# Patient Record
Sex: Female | Born: 1960 | Race: White | Hispanic: No | Marital: Married | Smoking: Current every day smoker
Health system: Southern US, Community
[De-identification: ages and names within clinical notes are randomized; demographics above are authoritative.]

## PROBLEM LIST (undated history)

## (undated) DIAGNOSIS — E785 Hyperlipidemia, unspecified: Secondary | ICD-10-CM

## (undated) DIAGNOSIS — D649 Anemia, unspecified: Secondary | ICD-10-CM

## (undated) DIAGNOSIS — N189 Chronic kidney disease, unspecified: Secondary | ICD-10-CM

## (undated) DIAGNOSIS — Z5189 Encounter for other specified aftercare: Secondary | ICD-10-CM

## (undated) DIAGNOSIS — I10 Essential (primary) hypertension: Secondary | ICD-10-CM

## (undated) DIAGNOSIS — E119 Type 2 diabetes mellitus without complications: Secondary | ICD-10-CM

## (undated) DIAGNOSIS — IMO0001 Reserved for inherently not codable concepts without codable children: Secondary | ICD-10-CM

## (undated) DIAGNOSIS — R51 Headache: Secondary | ICD-10-CM

## (undated) DIAGNOSIS — J449 Chronic obstructive pulmonary disease, unspecified: Secondary | ICD-10-CM

## (undated) HISTORY — DX: Hyperlipidemia, unspecified: E78.5

## (undated) HISTORY — PX: CYSTOSCOPY W/ URETERAL STENT PLACEMENT: SHX1429

## (undated) HISTORY — PX: DILATION AND CURETTAGE OF UTERUS: SHX78

---

## 2004-03-01 ENCOUNTER — Inpatient Hospital Stay (HOSPITAL_COMMUNITY): Admission: EM | Admit: 2004-03-01 | Discharge: 2004-03-02 | Payer: Self-pay | Admitting: *Deleted

## 2004-03-03 ENCOUNTER — Encounter: Admission: RE | Admit: 2004-03-03 | Discharge: 2004-03-03 | Payer: Self-pay | Admitting: Obstetrics and Gynecology

## 2004-03-07 ENCOUNTER — Encounter: Admission: RE | Admit: 2004-03-07 | Discharge: 2004-03-07 | Payer: Self-pay | Admitting: Family Medicine

## 2004-03-24 ENCOUNTER — Encounter: Admission: RE | Admit: 2004-03-24 | Discharge: 2004-03-24 | Payer: Self-pay | Admitting: Family Medicine

## 2004-04-25 ENCOUNTER — Ambulatory Visit (HOSPITAL_COMMUNITY): Admission: RE | Admit: 2004-04-25 | Discharge: 2004-04-25 | Payer: Self-pay | Admitting: Family Medicine

## 2004-04-25 ENCOUNTER — Encounter (INDEPENDENT_AMBULATORY_CARE_PROVIDER_SITE_OTHER): Payer: Self-pay | Admitting: Specialist

## 2004-04-28 ENCOUNTER — Encounter: Admission: RE | Admit: 2004-04-28 | Discharge: 2004-04-28 | Payer: Self-pay | Admitting: Obstetrics and Gynecology

## 2004-07-01 ENCOUNTER — Ambulatory Visit: Payer: Self-pay | Admitting: Family Medicine

## 2005-06-13 ENCOUNTER — Ambulatory Visit: Payer: Self-pay | Admitting: General Practice

## 2006-06-27 ENCOUNTER — Ambulatory Visit: Payer: Self-pay | Admitting: General Practice

## 2007-07-02 ENCOUNTER — Ambulatory Visit: Payer: Self-pay | Admitting: General Practice

## 2008-07-08 ENCOUNTER — Ambulatory Visit: Payer: Self-pay | Admitting: General Practice

## 2008-12-22 ENCOUNTER — Ambulatory Visit (HOSPITAL_COMMUNITY): Admission: RE | Admit: 2008-12-22 | Discharge: 2008-12-22 | Payer: Self-pay | Admitting: Obstetrics

## 2010-07-06 ENCOUNTER — Ambulatory Visit: Payer: Self-pay | Admitting: General Practice

## 2010-10-02 ENCOUNTER — Encounter: Payer: Self-pay | Admitting: Family Medicine

## 2011-01-27 NOTE — Discharge Summary (Signed)
NAME:  Robin Chen, Robin Chen                             ACCOUNT NO.:  [id number]   MEDICAL RECORD NO.:  [id number]                   PATIENT TYPE:  INP   LOCATION:  5725                                 FACILITY:  [facility]   PHYSICIAN:  Leighton Roach McDiarmid, M.D.             DATE OF BIRTH:  [birth year]   DATE OF ADMISSION:  03/01/2004  DATE OF DISCHARGE:  03/02/2004                                 DISCHARGE SUMMARY   DISCHARGE DIAGNOSES:  1. Anemia, microcytic, asymptomatic.  2. Menometrorrhagia.   CONSULTATIONS:  None.   PROCEDURES:  1. Transfusion of 2 units of packed red blood cells.  2. Abdominal pelvic ultrasound.  Impression normal uterine size of 1.3 cm     endometrium with small endometrial polyp.  No evidence of fibroids.     Small simple cyst.   DISCHARGE MEDICATIONS:  Iron sulfate 325 mg one p.o. t.i.d.   ACTIVITY:  As tolerated.  She is to return to work Tuesday, March 08, 2004.   DIET:  As tolerated.   FOLLOWUP:  Follow up Thursday, March 03, 2004, at 3 p.m. at Gynecology  Clinic, [facility] on [facility].   BRIEF HISTORY AND HOSPITAL COURSE:  Robin Chen is a 50 year old female from  [place] who had been having dizziness, headaches and progressive weakness  over the past four days with known metrorrhagia x 1.5 years with 6-7 days of  bleeding monthly, heavy clots, and severe cramping.  History of difficulty  with associated anemia in the past with hemoglobin of 5.7 in May 2004.  She  was seen at [facility] and primary care physician for these  symptoms and found to have a hemoglobin of 6.8.   PROBLEM #1 - ANEMIA WITH SYMPTOMATIC MICROCYTIC ANEMIA, LIKELY CHRONIC  SECONDARY TO MENOMETRORRHAGIA AS WELL AS IRON DEFICIENCY ANEMIA:  She was  admitted and given fluids as well as 2 units of packed red blood cells.  Hemoglobin increased to 8.8.  No other source of bleeding, i.e, GI source  found and fecal occult negative.  Outpatient GYN consult as stated  above.  Iron studies with ferritin of 1 and serum iron of 11% saturation.  Due to  severe iron deficiency anemia, she was started on iron as above.   PROBLEM #2 - MENOMETRORRHAGIA:  Unclear cause but likely the reason for #1.  Pelvic ultrasound negative with negative exam.  Follow up GYN as above.   DISPOSITION:  The patient is much improved with blood products, stable and  discharged home on hospital day #2 and follow up as above.   LABORATORY DATA:  Labs March 01, 2004, sodium 139, potassium 4.0, chloride  106, CO2 28, glucose 95, BUN 7, creatinine 0.6, calcium 9.3.  LFTs within  normal limits.  Hemoglobin electrophoresis performed with normal evaluation  of hemoglobin __________ and iron studies as above.  On March 02, 2004,  hemoglobin 8.8,  hematocrit 28.4, MCV 60.8.      Ace Gins, MD                        Etta Grandchild, M.D.    JS/MEDQ  D:  04/05/2004  T:  04/05/2004  Job:  [id number]   cc:   [facility]

## 2011-01-27 NOTE — Op Note (Signed)
NAME:  Robin Chen, Robin Chen                             ACCOUNT NO.:  [id number]   MEDICAL RECORD NO.:  [id number]                   PATIENT TYPE:  AMB   LOCATION:  SDC                                  [facility]   PHYSICIAN:  Tanya S. Shawnie Pons, M.D.                DATE OF BIRTH:  February 15, 1961   DATE OF PROCEDURE:  04/25/2004  DATE OF DISCHARGE:                                 OPERATIVE REPORT   PREOPERATIVE DIAGNOSIS:  Abnormal uterine bleeding and anemia.   POSTOPERATIVE DIAGNOSIS:  Abnormal uterine bleeding and anemia.   OPERATION PERFORMED:  Dilation and curettage, hysteroscopy.   SURGEON:  Shelbie Proctor. Shawnie Pons, M.D.   ANESTHESIA:  MAC.   ANESTHESIOLOGIST:  Raul Del, M.D.   FINDINGS:  Multiple polyps noted.   ESTIMATED BLOOD LOSS:  Less than 25 mL.   SPECIMENS:  Currettings to pathology.   INDICATIONS FOR PROCEDURE:  The patient is a 50 year old gravida 3, para 3,  who has heavy vaginal bleeding __________ recently due to symptomatic anemia  with hemoglobin of 6.8 and transfused two units of packed red blood cells.  She had an endometrial biopsy and Pap smear that were normal and had pelvic  ultrasound which revealed a probable uterine endometrial polyp.  She is here  today for definitive treatment.   DESCRIPTION OF PROCEDURE:  The patient was taken to the operating room.  She  was placed in dorsal lithotomy position in [place] stirrups.  She was prepped  and draped in the usual sterile fashion.  Her bladder drained with a red  rubber catheter.  Speculum then placed inside the vagina and cervix  visualized and grasped with single toothed tenaculum and sounded to 9 cm.  It was sequentially dilated until the hysteroscope could be fitted easily.  The saline was then turned on and the uterine cavity infused with a total of  20 mL of straight saline. The uterine cavity revealed multiple polyps,  especially anteriorly.  Otherwise, it was without lesion.  Some fluffy  endometrium was  noted.  The hysteroscope was then removed and sharp  curettage was done until a gritty surface was found in all four quadrants.  The curettings were then sent to pathology.  The single toothed tenaculum  and speculum were removed and a bimanual exam done to make sure the uterus  was firm.  All lap, needle and instruments were all correct times two.  She  was then taken to recovery room awake and in stable condition.                                               Shelbie Proctor. Shawnie Pons, M.D.    TSP/MEDQ  D:  04/25/2004  T:  04/25/2004  Job:  [id number]

## 2011-01-27 NOTE — Group Therapy Note (Signed)
NAME:  Robin Chen, Ravert                 ACCOUNT NO.:  [id number]   MEDICAL RECORD NO.:  [id number]          PATIENT TYPE:  WOC   LOCATION:  [facility]                   FACILITY:  [facility]   PHYSICIAN:  Tinnie Gens, MD        DATE OF BIRTH:  22-Jul-1961   DATE OF SERVICE:  07/01/2004                                    CLINIC NOTE   CHIEF COMPLAINT:  Follow-up.   HISTORY OF PRESENT ILLNESS:  Patient is a 50 year old gravida 3, para 3 who  is status post D&C and hysteroscopy for endometrial polyp in August.  The  patient reports that since that time she has periods that continue to be  regular but now her bleeding has gone from 7-10 days down to four days.  It  was only heavy on the first day.  She continues to have some cramping that  is associated with her periods but states that is something she can live  with at this point.   PHYSICAL EXAMINATION:  VITAL SIGNS:  As noted in the chart.  GENERAL:  She is a well-developed, well-nourished white female in no acute  distress.   IMPRESSION:  1.  Intrauterine bleeding related to polyp status post removal with improved      bleeding.  2.  Pelvic pain and dysmenorrhea.   PLAN:  1.  Naprosyn 500 mg one p.o. b.i.d. p.r.n. with food for associated      cramping.  2.  Follow up in June of 2006 for her annual Pap smear and to see how her      bleeding is doing.      TP/MEDQ  D:  07/01/2004  T:  07/01/2004  Job:  [id number]

## 2011-01-27 NOTE — Group Therapy Note (Signed)
NAME:  Robin Chen, Robin Chen                             ACCOUNT NO.:  [id number]   MEDICAL RECORD NO.:  [id number]                   PATIENT TYPE:  OUT   LOCATION:  [facility]                           FACILITY:  [facility]   PHYSICIAN:  Tinnie Gens, MD                     DATE OF BIRTH:  August 05, 1961   DATE OF SERVICE:  03/03/2004                                    CLINIC NOTE   CHIEF COMPLAINT:  Anemia and abnormal bleeding.   HISTORY OF PRESENT ILLNESS:  Ms. Doorn is a 50 year old [place] woman who was  admitted to [facility] on March 01, 2004 for symptomatic severe  anemia with a hemoglobin of 6.8 attributed to her history of  menometrorrhagia.  She describes her periods as being very heavy and long in  duration, lasting 7-10 days.  She denies any bleeding between periods but  will have occasionally two times a month and then will skip months.  She has  had blood transfusions in the past for this and was transfused 2 units on  this last admission.  She has been on iron before but has not taken on a  regular basis, though admits that she probably needs to.   Evaluation in the hospital included iron studies which were consistent with  a profound iron deficiency anemia.  Pelvic ultrasound revealed normal  uterine size with 6.2 x 4.6 x 6 cm.  The endometrium was 1.3 cm maximum  thickness and there was thought to be a small polyp in the endometrial  cavity.  Ovaries were noted to be normal with the exception of a simple cyst  on the right of 2.6 x 1.9 cm.  No fibroids were noted.   Her last menstrual period was on February 20, 2004 and she has not had any  uterine bleeding for the past week.  With regards to the anemia, she does  not feel back to normal but the headaches and dizziness are much improved.  She denies having an abnormal Pap smear and states her last Pap smear was on  March 01, 2004 but it is difficult to attain if this was simply a pelvic exam  or, indeed, a cancer screening Pap  smear.   PAST MEDICAL HISTORY:  She is a gravida 3 para 3 - all vaginal deliveries.  Does have a self diagnosis of heartburn treated with Tums, but otherwise is  well.   ALLERGIES:  PENICILLIN.   MEDICINES:  Iron pills.   SOCIAL HISTORY:  Significant for that she is a one-pack-per-day smoker for  the last 25 years.   FAMILY HISTORY:  Significant for MIs in her father and throat cancer in her  mother.  No history of gynecological colon cancer.   PHYSICAL EXAMINATION:  GENERAL:  She is a pale but otherwise well-appearing  white, mildly-obese female in no acute distress.  VITAL SIGNS:  Normal with  a blood pressure of 121/64, pulse of 85, and  temperature 99.1.  ABDOMEN:  Soft, nontender, nondistended.  GENITOURINARY:  Normal external genitalia, no lesions.  Bimanual exam  reveals a minimally-tender uterus without cervical motion tenderness.  No  fullness or masses are appreciated.  Scant amount of white discharge.  Cervix appeared within normal limits.  A Pap smear was obtained.   Endometrial biopsy was performed using tenaculum to stabilize the cervix  following clearing of the cervix with iodine.  Endometrial biopsy catheter  was introduced without resistance and sounded to approximately 8 cm.  Endometrial tissue was retrieved without any difficulty and is sent to  pathology.  The patient tolerated the procedure well and without incident.  There was hemostasis following removal of the tenaculum.   ASSESSMENT AND PLAN:  1. Abnormal uterine bleeding.  Will follow up on Pap smear and endometrial     biopsy.  The patient to follow up for discussion of these in 2-3 weeks.     It does appear to be unlikely that the majority of nonspecific bleeding     secondary to the perceived endometrial polyp on ultrasound.  2. Iron deficiency anemia.  I encouraged the patient to continue taking iron     and could use over-the-counter medicines for constipation if needed.   The patient was seen  and discussed with Dr. Tinnie Gens.     Douglass Rivers, MD                        Tinnie Gens, MD    CH/MEDQ  D:  03/03/2004  T:  03/04/2004  Job:  [id number]

## 2011-07-05 ENCOUNTER — Ambulatory Visit: Payer: Self-pay

## 2012-01-07 ENCOUNTER — Ambulatory Visit (HOSPITAL_COMMUNITY)
Admission: RE | Admit: 2012-01-07 | Discharge: 2012-01-07 | Disposition: A | Discharge status: Home / Self Care | Payer: BC Managed Care – PPO | Source: Ambulatory Visit | Attending: Family Medicine | Admitting: Family Medicine

## 2012-01-07 ENCOUNTER — Ambulatory Visit (INDEPENDENT_AMBULATORY_CARE_PROVIDER_SITE_OTHER): Payer: BC Managed Care – PPO | Admitting: Family Medicine

## 2012-01-07 VITALS — BP 110/62 | HR 96 | Temp 98.4°F | Resp 18 | Ht 65.0 in | Wt 175.0 lb

## 2012-01-07 DIAGNOSIS — N390 Urinary tract infection, site not specified: Secondary | ICD-10-CM

## 2012-01-07 DIAGNOSIS — R1011 Right upper quadrant pain: Secondary | ICD-10-CM

## 2012-01-07 DIAGNOSIS — R197 Diarrhea, unspecified: Secondary | ICD-10-CM

## 2012-01-07 DIAGNOSIS — N200 Calculus of kidney: Secondary | ICD-10-CM

## 2012-01-07 DIAGNOSIS — N201 Calculus of ureter: Secondary | ICD-10-CM | POA: Insufficient documentation

## 2012-01-07 DIAGNOSIS — N2889 Other specified disorders of kidney and ureter: Secondary | ICD-10-CM | POA: Insufficient documentation

## 2012-01-07 DIAGNOSIS — D72829 Elevated white blood cell count, unspecified: Secondary | ICD-10-CM | POA: Insufficient documentation

## 2012-01-07 DIAGNOSIS — E860 Dehydration: Secondary | ICD-10-CM

## 2012-01-07 DIAGNOSIS — R8281 Pyuria: Secondary | ICD-10-CM

## 2012-01-07 DIAGNOSIS — R112 Nausea with vomiting, unspecified: Secondary | ICD-10-CM

## 2012-01-07 DIAGNOSIS — R1031 Right lower quadrant pain: Secondary | ICD-10-CM

## 2012-01-07 DIAGNOSIS — R1110 Vomiting, unspecified: Secondary | ICD-10-CM | POA: Insufficient documentation

## 2012-01-07 DIAGNOSIS — I700 Atherosclerosis of aorta: Secondary | ICD-10-CM | POA: Insufficient documentation

## 2012-01-07 LAB — POCT UA - MICROSCOPIC ONLY: Crystals, Ur, HPF, POC: NEGATIVE

## 2012-01-07 LAB — POCT CBC
HCT, POC: 43.2 % (ref 37.7–47.9)
Hemoglobin: 13.6 g/dL (ref 12.2–16.2)
MID (cbc): 0.4 (ref 0–0.9)
POC Granulocyte: 13.3 — AB (ref 2–6.9)
RBC: 4.88 M/uL (ref 4.04–5.48)
RDW, POC: 13.9 %

## 2012-01-07 LAB — COMPREHENSIVE METABOLIC PANEL
ALT: 52 U/L — ABNORMAL HIGH (ref 0–35)
AST: 38 U/L — ABNORMAL HIGH (ref 0–37)
Albumin: 4 g/dL (ref 3.5–5.2)
Chloride: 105 mEq/L (ref 96–112)
Glucose, Bld: 133 mg/dL — ABNORMAL HIGH (ref 70–99)
Potassium: 4.5 mEq/L (ref 3.5–5.3)
Sodium: 139 mEq/L (ref 135–145)
Total Bilirubin: 1.4 mg/dL — ABNORMAL HIGH (ref 0.3–1.2)
Total Protein: 6.1 g/dL (ref 6.0–8.3)

## 2012-01-07 LAB — POCT URINALYSIS DIPSTICK
Glucose, UA: NEGATIVE
Nitrite, UA: POSITIVE
Urobilinogen, UA: 4

## 2012-01-07 MED ORDER — ONDANSETRON 4 MG PO TBDP: 4.0000 mg | ORAL_TABLET | Freq: Three times a day (TID) | ORAL | Status: DC | PRN

## 2012-01-07 MED ORDER — ONDANSETRON 4 MG PO TBDP
4.0000 mg | ORAL_TABLET | Freq: Once | ORAL | Status: AC
  Administered 2012-01-07: 4 mg via ORAL

## 2012-01-07 MED ORDER — ONDANSETRON 4 MG PO TBDP: 4.0000 mg | ORAL_TABLET | Freq: Three times a day (TID) | ORAL | Status: AC | PRN

## 2012-01-07 MED ORDER — LEVOFLOXACIN 500 MG PO TABS: 500.0000 mg | ORAL_TABLET | Freq: Every day | ORAL | Status: AC

## 2012-01-07 MED ORDER — IOHEXOL 300 MG/ML  SOLN
100.0000 mL | Freq: Once | INTRAMUSCULAR | Status: AC | PRN
  Administered 2012-01-07: 100 mL via INTRAVENOUS

## 2012-01-07 NOTE — Progress Notes (Signed)
Subjective:    Patient ID: Robin Chen, female    DOB: 03/27/1961, 51 y.o.   MRN: [id number]  HPI Robin Chen is a 51 y.o. female C/o RLQ abd pain  - started 2 days ago,  Vomiting started yesterday afternoon - persisted overnight.  Trying to drink sips of water, last emesis 5:30 this am.  Diarrhea started last night - 3 episodes.  Unknown how many vomiting episodes, can't count.  No uop yet this am. No known sick contacts.  Recently traveled form [place] - flew from [place] (visiting there, lives in [place]) April 18th.  Similar episodes of RLQ in past few months - usually improves in a few days.     TX: alleve, ibuprofen, midol.  Language barrier -here with daughter - translating.  Review of Systems  Constitutional: Positive for fever and chills.       Feels feverish, but no known objective fever.  Respiratory: Negative for cough and shortness of breath.   Cardiovascular: Negative for chest pain.  Gastrointestinal: Positive for nausea, vomiting, abdominal pain and diarrhea.  Genitourinary: Negative for dysuria, urgency, frequency, flank pain and difficulty urinating.  Skin: Negative for rash.       Objective:   Physical Exam  Constitutional: She is oriented to person, place, and time. She appears well-developed and well-nourished.       Appears uncomfortable, nontoxic.  HENT:  Head: Normocephalic and atraumatic.  Cardiovascular: Regular rhythm and normal pulses.  Tachycardia present.   Pulmonary/Chest: Effort normal and breath sounds normal.  Abdominal: Soft. Normal appearance. She exhibits no distension. There is tenderness in the right upper quadrant and right lower quadrant. There is tenderness at McBurney's point. There is no rebound.    Neurological: She is alert and oriented to person, place, and time.  Skin: She is not diaphoretic.     Results for orders placed in visit on 01/07/12  POCT CBC      Component Value Range   WBC 14.1 (*) 4.6 - 10.2 (K/uL)   Lymph, poc 0.5  (*) 0.6 - 3.4    POC LYMPH PERCENT 3.3 (*) 10 - 50 (%L)   MID (cbc) 0.4  0 - 0.9    POC MID % 2.5  0 - 12 (%M)   POC Granulocyte 13.3 (*) 2 - 6.9    Granulocyte percent 94.2 (*) 37 - 80 (%G)   RBC 4.88  4.04 - 5.48 (M/uL)   Hemoglobin 13.6  12.2 - 16.2 (g/dL)   HCT, POC 40.9  81.1 - 47.9 (%)   MCV 88.6  80 - 97 (fL)   MCH, POC 27.9  27 - 31.2 (pg)   MCHC 31.5 (*) 31.8 - 35.4 (g/dL)   RDW, POC 91.4     Platelet Count, POC 133 (*) 142 - 424 (K/uL)   MPV 9.7  0 - 99.8 (fL)  POCT URINALYSIS DIPSTICK      Component Value Range   Color, UA brown-orange     Clarity, UA cloudy     Glucose, UA neg     Bilirubin, UA moderate     Ketones, UA 15     Spec Grav, UA 1.020     Blood, UA large     pH, UA 5.5     Protein, UA >=300     Urobilinogen, UA 4.0     Nitrite, UA pos     Leukocytes, UA small (1+)    POCT UA - MICROSCOPIC ONLY      Component  Value Range   WBC, Ur, HPF, POC 14-22     RBC, urine, microscopic 36-50     Bacteria, U Microscopic trace     Mucus, UA small     Epithelial cells, urine per micros 7-10     Crystals, Ur, HPF, POC neg     Casts, Ur, LPF, POC neg     Yeast, UA neg           Assessment & Plan:  Robin Chen is a 51 y.o. female  1. Vomiting  ondansetron (ZOFRAN-ODT) disintegrating tablet 4 mg, Lipase  2. RLQ abdominal pain  POCT CBC, POCT urinalysis dipstick, POCT UA - Microscopic Only  3. Diarrhea    4. Dehydration    5. RUQ abdominal pain  Comprehensive metabolic panel    RLQ pain, with emesis, few episodes diarrhea. Leukocytosis.  DDX elevation with vomiting vs. Bacterial illness, including appendicitis and UTI without urinary symptoms.  Volume depleted - concentrated urine, with hematuria and pyuria. Check CT abd/pelvis today.  Check urine culture, CMP, lipase. Improved nausea with Zofran - RX # 20.  Further eval/tx based on CT.  1755 addendum:  CT report summary: "Partially obstructing 1 cm stone within the right UVJ results in mild right sided  pelvocaliectasis and perinephric stranding. Minimal right-sided downstream ureterectasis and mild right sided uroepithelial enhancement however no distal ureteral stones are identified. Nonspecific though worrisome for infection. Correlation with urinalysis is recommended. Findings suggestive of hepatic steatosis.   Called radiology - discussed results above and plan below with patient's son to translate to patient.  Understanding expressed.  Start Levaquin 500mg  QD. Zofran 4mg  ODT Q8hprn, push fluids, refer to Urology for nephrolith.  CMP pending to evaluate renal function.  Recheck here in am with Dr. Cleta Alberts. RTC/ER precautions given.

## 2012-01-08 ENCOUNTER — Ambulatory Visit (INDEPENDENT_AMBULATORY_CARE_PROVIDER_SITE_OTHER): Payer: BC Managed Care – PPO | Admitting: Emergency Medicine

## 2012-01-08 ENCOUNTER — Encounter (HOSPITAL_COMMUNITY): Payer: Self-pay | Admitting: Anesthesiology

## 2012-01-08 ENCOUNTER — Encounter (HOSPITAL_COMMUNITY): Payer: Self-pay | Admitting: *Deleted

## 2012-01-08 ENCOUNTER — Other Ambulatory Visit (HOSPITAL_COMMUNITY): Payer: Self-pay | Admitting: Pharmacy Technician

## 2012-01-08 ENCOUNTER — Encounter (HOSPITAL_COMMUNITY)
Admission: RE | Disposition: A | Discharge status: Home / Self Care | Payer: Self-pay | Source: Ambulatory Visit | Attending: Urology

## 2012-01-08 ENCOUNTER — Inpatient Hospital Stay (HOSPITAL_COMMUNITY): Payer: BC Managed Care – PPO

## 2012-01-08 ENCOUNTER — Other Ambulatory Visit: Payer: Self-pay | Admitting: Urology

## 2012-01-08 ENCOUNTER — Inpatient Hospital Stay (HOSPITAL_COMMUNITY): Payer: BC Managed Care – PPO | Admitting: Anesthesiology

## 2012-01-08 ENCOUNTER — Observation Stay (HOSPITAL_COMMUNITY)
Admission: RE | Admit: 2012-01-08 | Discharge: 2012-01-10 | Disposition: A | Discharge status: Home / Self Care | Payer: BC Managed Care – PPO | Source: Ambulatory Visit | Attending: Urology | Admitting: Urology

## 2012-01-08 VITALS — BP 112/58 | HR 108 | Temp 98.4°F | Resp 18 | Ht 65.0 in | Wt 207.0 lb

## 2012-01-08 DIAGNOSIS — D7289 Other specified disorders of white blood cells: Secondary | ICD-10-CM

## 2012-01-08 DIAGNOSIS — Z79899 Other long term (current) drug therapy: Secondary | ICD-10-CM | POA: Insufficient documentation

## 2012-01-08 DIAGNOSIS — Z01812 Encounter for preprocedural laboratory examination: Secondary | ICD-10-CM | POA: Insufficient documentation

## 2012-01-08 DIAGNOSIS — N289 Disorder of kidney and ureter, unspecified: Secondary | ICD-10-CM | POA: Diagnosis present

## 2012-01-08 DIAGNOSIS — D696 Thrombocytopenia, unspecified: Secondary | ICD-10-CM | POA: Diagnosis present

## 2012-01-08 DIAGNOSIS — R1031 Right lower quadrant pain: Secondary | ICD-10-CM

## 2012-01-08 DIAGNOSIS — N200 Calculus of kidney: Secondary | ICD-10-CM | POA: Diagnosis present

## 2012-01-08 DIAGNOSIS — N201 Calculus of ureter: Principal | ICD-10-CM | POA: Insufficient documentation

## 2012-01-08 DIAGNOSIS — A419 Sepsis, unspecified organism: Secondary | ICD-10-CM | POA: Diagnosis present

## 2012-01-08 DIAGNOSIS — N390 Urinary tract infection, site not specified: Secondary | ICD-10-CM | POA: Insufficient documentation

## 2012-01-08 HISTORY — DX: Reserved for inherently not codable concepts without codable children: IMO0001

## 2012-01-08 HISTORY — DX: Headache: R51

## 2012-01-08 HISTORY — DX: Essential (primary) hypertension: I10

## 2012-01-08 HISTORY — DX: Encounter for other specified aftercare: Z51.89

## 2012-01-08 HISTORY — DX: Chronic kidney disease, unspecified: N18.9

## 2012-01-08 HISTORY — DX: Chronic obstructive pulmonary disease, unspecified: J44.9

## 2012-01-08 HISTORY — DX: Anemia, unspecified: D64.9

## 2012-01-08 LAB — POCT URINALYSIS DIPSTICK
Glucose, UA: NEGATIVE
Ketones, UA: NEGATIVE
Protein, UA: 30
Spec Grav, UA: 1.015
pH, UA: 5.5

## 2012-01-08 LAB — POCT UA - MICROSCOPIC ONLY
Casts, Ur, LPF, POC: NEGATIVE
Crystals, Ur, HPF, POC: NEGATIVE
Mucus, UA: NEGATIVE

## 2012-01-08 LAB — POCT CBC
Granulocyte percent: 93.9 %G — AB (ref 37–80)
Hemoglobin: 12.8 g/dL (ref 12.2–16.2)
MCHC: 31.8 g/dL (ref 31.8–35.4)
POC Granulocyte: 13.7 — AB (ref 2–6.9)
RBC: 4.61 M/uL (ref 4.04–5.48)
WBC: 14.6 10*3/uL — AB (ref 4.6–10.2)

## 2012-01-08 LAB — BASIC METABOLIC PANEL
CO2: 23 mEq/L (ref 19–32)
Chloride: 104 mEq/L (ref 96–112)
Glucose, Bld: 95 mg/dL (ref 70–99)
Potassium: 3.5 mEq/L (ref 3.5–5.1)
Sodium: 137 mEq/L (ref 135–145)

## 2012-01-08 LAB — CBC
Hemoglobin: 12.8 g/dL (ref 12.0–15.0)
RBC: 4.39 MIL/uL (ref 3.87–5.11)

## 2012-01-08 SURGERY — CYSTOSCOPY, WITH STENT INSERTION
Anesthesia: General | Site: Ureter | Laterality: Right | Wound class: Clean Contaminated

## 2012-01-08 MED ORDER — PROPOFOL 10 MG/ML IV BOLUS
INTRAVENOUS | Status: DC | PRN
  Administered 2012-01-08: 150 mg via INTRAVENOUS

## 2012-01-08 MED ORDER — ONDANSETRON HCL 4 MG/2ML IJ SOLN: 4.0000 mg | INTRAMUSCULAR | Status: DC | PRN

## 2012-01-08 MED ORDER — ZOLPIDEM TARTRATE 5 MG PO TABS: 5.0000 mg | ORAL_TABLET | Freq: Every evening | ORAL | Status: DC | PRN

## 2012-01-08 MED ORDER — HYOSCYAMINE SULFATE 0.125 MG SL SUBL
0.1250 mg | SUBLINGUAL_TABLET | SUBLINGUAL | Status: DC | PRN
  Filled 2012-01-08: qty 1

## 2012-01-08 MED ORDER — MIDAZOLAM HCL 5 MG/5ML IJ SOLN
INTRAMUSCULAR | Status: DC | PRN
  Administered 2012-01-08: 2 mg via INTRAVENOUS

## 2012-01-08 MED ORDER — FENTANYL CITRATE 0.05 MG/ML IJ SOLN
INTRAMUSCULAR | Status: DC | PRN
  Administered 2012-01-08 (×4): 25 ug via INTRAVENOUS

## 2012-01-08 MED ORDER — OXYCODONE-ACETAMINOPHEN 5-325 MG PO TABS: 1.0000 | ORAL_TABLET | ORAL | Status: DC | PRN

## 2012-01-08 MED ORDER — DIATRIZOATE MEGLUMINE 30 % UR SOLN
URETHRAL | Status: DC | PRN
  Administered 2012-01-08: 5 mL

## 2012-01-08 MED ORDER — DEXTROSE 5 % IV SOLN
1.0000 g | INTRAVENOUS | Status: DC
  Administered 2012-01-08 – 2012-01-09 (×2): 1 g via INTRAVENOUS
  Filled 2012-01-08 (×2): qty 10

## 2012-01-08 MED ORDER — PHENYLEPHRINE HCL 10 MG/ML IJ SOLN
INTRAMUSCULAR | Status: DC | PRN
  Administered 2012-01-08: 40 ug via INTRAVENOUS

## 2012-01-08 MED ORDER — KCL IN DEXTROSE-NACL 20-5-0.45 MEQ/L-%-% IV SOLN
INTRAVENOUS | Status: DC
  Administered 2012-01-08 – 2012-01-10 (×4): via INTRAVENOUS
  Filled 2012-01-08 (×5): qty 1000

## 2012-01-08 MED ORDER — BISACODYL 10 MG RE SUPP: 10.0000 mg | Freq: Every day | RECTAL | Status: DC | PRN

## 2012-01-08 MED ORDER — STERILE WATER FOR IRRIGATION IR SOLN
Status: DC | PRN
  Administered 2012-01-08: 300 mL

## 2012-01-08 MED ORDER — LACTATED RINGERS IV SOLN
INTRAVENOUS | Status: DC
  Administered 2012-01-08: 1000 mL via INTRAVENOUS
  Administered 2012-01-08: 18:00:00 via INTRAVENOUS

## 2012-01-08 MED ORDER — LIDOCAINE HCL (CARDIAC) 20 MG/ML IV SOLN
INTRAVENOUS | Status: DC | PRN
  Administered 2012-01-08: 50 mg via INTRAVENOUS

## 2012-01-08 MED ORDER — DOCUSATE SODIUM 100 MG PO CAPS
100.0000 mg | ORAL_CAPSULE | Freq: Two times a day (BID) | ORAL | Status: DC
  Administered 2012-01-08 – 2012-01-09 (×3): 100 mg via ORAL
  Filled 2012-01-08 (×5): qty 1

## 2012-01-08 MED ORDER — MUPIROCIN 2 % EX OINT
TOPICAL_OINTMENT | CUTANEOUS | Status: AC
  Filled 2012-01-08: qty 22

## 2012-01-08 MED ORDER — HYDROMORPHONE HCL PF 1 MG/ML IJ SOLN: 0.5000 mg | INTRAMUSCULAR | Status: DC | PRN

## 2012-01-08 MED ORDER — FENTANYL CITRATE 0.05 MG/ML IJ SOLN
25.0000 ug | INTRAMUSCULAR | Status: DC | PRN
  Administered 2012-01-08: 25 ug via INTRAVENOUS

## 2012-01-08 MED ORDER — PROMETHAZINE HCL 25 MG/ML IJ SOLN: 6.2500 mg | INTRAMUSCULAR | Status: DC | PRN

## 2012-01-08 MED ORDER — ACETAMINOPHEN 325 MG PO TABS
650.0000 mg | ORAL_TABLET | ORAL | Status: DC | PRN
  Administered 2012-01-08: 650 mg via ORAL
  Filled 2012-01-08: qty 2

## 2012-01-08 MED ORDER — GENTAMICIN SULFATE 40 MG/ML IJ SOLN
520.0000 mg | INTRAVENOUS | Status: DC
  Administered 2012-01-08 – 2012-01-09 (×2): 520 mg via INTRAVENOUS
  Filled 2012-01-08 (×2): qty 13

## 2012-01-08 SURGICAL SUPPLY — 13 items
BAG URO CATCHER STRL LF (DRAPE) ×2 IMPLANT
CATH URET 5FR 28IN OPEN ENDED (CATHETERS) ×2 IMPLANT
CLOTH BEACON ORANGE TIMEOUT ST (SAFETY) ×2 IMPLANT
DRAPE CAMERA CLOSED 9X96 (DRAPES) ×2 IMPLANT
GLOVE SURG SS PI 8.0 STRL IVOR (GLOVE) ×2 IMPLANT
GOWN PREVENTION PLUS XLARGE (GOWN DISPOSABLE) ×2 IMPLANT
GOWN STRL REIN XL XLG (GOWN DISPOSABLE) ×2 IMPLANT
GUIDEWIRE STR DUAL SENSOR (WIRE) ×2 IMPLANT
MANIFOLD NEPTUNE II (INSTRUMENTS) ×2 IMPLANT
PACK CYSTO (CUSTOM PROCEDURE TRAY) ×2 IMPLANT
STENT CONTOUR 6FRX24X.038 (STENTS) ×2 IMPLANT
STENT CONTOUR 6FRX26X.038 (STENTS) ×2 IMPLANT
TUBING CONNECTING 10 (TUBING) ×2 IMPLANT

## 2012-01-08 NOTE — Anesthesia Postprocedure Evaluation (Signed)
  Anesthesia Post-op Note  Patient: Database administrator  Procedure(s) Performed: Procedure(s) (LRB): CYSTOSCOPY WITH STENT PLACEMENT (Right)  Patient Location: PACU  Anesthesia Type: General  Level of Consciousness: awake and alert   Airway and Oxygen Therapy: Patient Spontanous Breathing  Post-op Pain: mild  Post-op Assessment: Post-op Vital signs reviewed, Patient's Cardiovascular Status Stable, Respiratory Function Stable, Patent Airway and No signs of Nausea or vomiting  Post-op Vital Signs: stable  Complications: No apparent anesthesia complications

## 2012-01-08 NOTE — Anesthesia Preprocedure Evaluation (Addendum)
Anesthesia Evaluation  Patient identified by MRN, date of birth, ID band Patient awake    Reviewed: Allergy & Precautions, H&P , NPO status , Patient's Chart, lab work & pertinent test results  Airway Mallampati: II      Dental  (+) Edentulous Upper and Edentulous Lower   Pulmonary Current Smoker,          Cardiovascular  cxr and ecg reviewed.   Neuro/Psych  Headaches, negative psych ROS   GI/Hepatic negative GI ROS, Neg liver ROS,   Endo/Other  negative endocrine ROS  Renal/GU negative Renal ROS  negative genitourinary   Musculoskeletal negative musculoskeletal ROS (+)   Abdominal (+) + obese,   Peds negative pediatric ROS (+)  Hematology negative hematology ROS (+)   Anesthesia Other Findings   Reproductive/Obstetrics negative OB ROS                          Anesthesia Physical Anesthesia Plan  ASA: II  Anesthesia Plan: General   Post-op Pain Management:    Induction: Intravenous  Airway Management Planned: LMA  Additional Equipment:   Intra-op Plan:   Post-operative Plan: Extubation in OR  Informed Consent: I have reviewed the patients History and Physical, chart, labs and discussed the procedure including the risks, benefits and alternatives for the proposed anesthesia with the patient or authorized representative who has indicated his/her understanding and acceptance.   Dental advisory given  Plan Discussed with: CRNA  Anesthesia Plan Comments:         Anesthesia Quick Evaluation

## 2012-01-08 NOTE — Transfer of Care (Signed)
Immediate Anesthesia Transfer of Care Note  Patient: Database administrator  Procedure(s) Performed: Procedure(s) (LRB): CYSTOSCOPY WITH STENT PLACEMENT (Right)  Patient Location: PACU  Anesthesia Type: General  Level of Consciousness: sedated, patient cooperative and responds to stimulaton  Airway & Oxygen Therapy: Patient Spontanous Breathing and Patient connected to face mask oxgen  Post-op Assessment: Report given to PACU RN and Post -op Vital signs reviewed and stable  Post vital signs: Reviewed and stable  Complications: No apparent anesthesia complications

## 2012-01-08 NOTE — Progress Notes (Signed)
  Subjective:    Patient ID: Robin Chen, female    DOB: [birth year], 51 y.o.   MRN: [id number]  HPI patient had a followup visit yesterday. Your urine culture done and a CT done. She has a partially obstructing 1 cm right sided stone she also was found to have an elevated white count 14,000.    Review of Systems     Objective:   Physical Exam she is tender over the right flank area. She is also tender right upper and right mid abdomen.  Results for orders placed in visit on 01/08/12  POCT URINALYSIS DIPSTICK      Component Value Range   Color, UA AMBER     Clarity, UA CLEAR     Glucose, UA NEG     Bilirubin, UA SMALL     Ketones, UA NEG     Spec Grav, UA 1.015     Blood, UA MOD     pH, UA 5.5     Protein, UA 30     Urobilinogen, UA >=8.0     Nitrite, UA NEG     Leukocytes, UA Negative    POCT UA - MICROSCOPIC ONLY      Component Value Range   WBC, Ur, HPF, POC 1-3     RBC, urine, microscopic 4-8     Bacteria, U Microscopic 1+     Mucus, UA NEG     Epithelial cells, urine per micros 1-5     Crystals, Ur, HPF, POC NEG     Casts, Ur, LPF, POC NEG     Yeast, UA NEG    POCT CBC      Component Value Range   WBC 14.6 (*) 4.6 - 10.2 (K/uL)   Lymph, poc 0.6  0.6 - 3.4    POC LYMPH PERCENT 4.3 (*) 10 - 50 (%L)   MID (cbc) 0.3  0 - 0.9    POC MID % 1.8  0 - 12 (%M)   POC Granulocyte 13.7 (*) 2 - 6.9    Granulocyte percent 93.9 (*) 37 - 80 (%G)   RBC 4.61  4.04 - 5.48 (M/uL)   Hemoglobin 12.8  12.2 - 16.2 (g/dL)   HCT, POC 81.1  91.4 - 47.9 (%)   MCV 87.4  80 - 97 (fL)   MCH, POC 27.8  27 - 31.2 (pg)   MCHC 31.8  31.8 - 35.4 (g/dL)   RDW, POC 78.2     Platelet Count, POC 96 (*) 142 - 424 (K/uL)   MPV 11.5  0 - 99.8 (fL)        Assessment & Plan:  Sided stone.

## 2012-01-08 NOTE — Brief Op Note (Signed)
01/08/2012  4:12 PM  PATIENT:  Robin Chen  51 y.o. female  PRE-OPERATIVE DIAGNOSIS:  Right ureteral pelvic junction stone with sepsis.  POST-OPERATIVE DIAGNOSIS:  Same  PROCEDURE:  Procedure(s) (LRB): CYSTOSCOPY WITH STENT PLACEMENT (Right)  SURGEON:  Surgeon(s) and Role:    * Anner Crete, MD - Primary  PHYSICIAN ASSISTANT:   ASSISTANTS: none   ANESTHESIA:   general  EBL:     BLOOD ADMINISTERED:none  DRAINS: 6x26 right JJ stent.   LOCAL MEDICATIONS USED:  NONE  SPECIMEN:  No Specimen  DISPOSITION OF SPECIMEN:  N/A  COUNTS:  YES  TOURNIQUET:  * No tourniquets in log *  DICTATION: .Other Dictation: Dictation Number [id number]  PLAN OF CARE: Admit for overnight observation  PATIENT DISPOSITION:  PACU - hemodynamically stable.   Delay start of Pharmacological VTE agent (>24hrs) due to surgical blood loss or risk of bleeding: yes

## 2012-01-08 NOTE — Progress Notes (Signed)
ANTIBIOTIC CONSULT NOTE - INITIAL  Pharmacy Consult for Gentamicin Indication: Urosepsis  Allergies  Allergen Reactions  . Penicillins Rash    Patient Measurements: Height: 5\' 8"  (172.7 cm) Weight: 208 lb (94.348 kg) IBW/kg (Calculated) : 63.9  Adjusted Body Weight: 76kg  Vital Signs: Temp: 99.1 F (37.3 C) (04/29 1752) Temp src: Oral (04/29 1356) BP: 128/70 mmHg (04/29 1752) Pulse Rate: 97  (04/29 1752) Intake/Output from previous day:   Intake/Output from this shift: Total I/O In: 1000 [I.V.:1000] Out: -   Labs:  Basename 01/08/12 1435 01/08/12 1047 01/07/12 1239 01/07/12 1227  WBC 12.1* 14.6* 14.1* --  HGB 12.8 12.8 13.6 --  PLT 78* -- -- --  LABCREA -- -- -- --  CREATININE 0.81 -- -- 1.84*   Estimated Creatinine Clearance: 98.7 ml/min (by C-G formula based on Cr of 0.81).   Microbiology: No results found for this or any previous visit (from the past 720 hour(s)).  Medical History: Past Medical History  Diagnosis Date  . Hypertension   . COPD (chronic obstructive pulmonary disease)   . Anemia   . Headache   . Chronic kidney disease     Rt UPJ stone  . Blood transfusion     2003    Medications:  Scheduled:    . cefTRIAXone (ROCEPHIN)  IV  1 g Intravenous Q24H  . docusate sodium  100 mg Oral BID   Assessment:  35 YOF admitted for urgent cystoscopy with stent placement for proximal urethral stone & sepsis on 4/29.  Was given ceftriaxone 1gm and gentamicin 80mg  IM in the urology office ~1pm this afternoon.  Continuing ceftriaxone and gentamicin upon admission.   Goal of Therapy:  Eradication of infection Levels per [place] Nomogram  Plan:   Gentamicin 520mg  IV q24h (~7mg /kg adjusted body weight)  Check random level in 10hr and adjust per nomogram  Follow renal function, cultures if available  Loralee Pacas, PharmD, BCPS Pager: [phone] 01/08/2012,7:00 PM

## 2012-01-08 NOTE — H&P (Signed)
ctive Problems Problems  1. Proximal Ureteral Stone On The Right 592.1 2. Septicemia 038.9  History of Present Illness     Robin Chen is a 51 yo WF sent in consultation by Dr. Cleta Alberts for a right UPJ stone that measures 1cm.  She had the onset of pain about 2 days ago.  The pain has been severe and associated with nausea and vomiting.  She had no hematuria.  She has had chills with some fever today.  She is having a bit of left chest pain.  She has no cardiac history.  She has had no prior stones or urinary infections.  She has had no GU surgery.  Her temp is 101.1.   Past Medical History Problems  1. History of  Anemia 285.9 2. History of  Nephrolithiasis V13.01  Surgical History Problems  1. History of  No Surgical Problems  Current Meds 1. Ferrous Sulfate 325 (65 Fe) MG Oral Tablet; Therapy: (Recorded:29Apr2013) to 2. Ondansetron HCl 4 MG Oral Tablet; Therapy: (Recorded:29Apr2013) to  Allergies Medication  1. Penicillins  Family History Problems  1. Family history of  Family Health Status Number Of Children  Social History Problems    Caffeine Use   Marital History - Currently Married   Occupation:   Tobacco Use 305.1 Denied    History of  Alcohol Use  Review of Systems Genitourinary, constitutional, skin, eye, otolaryngeal, hematologic/lymphatic, cardiovascular, pulmonary, endocrine, musculoskeletal, gastrointestinal, neurological and psychiatric system(s) were reviewed and pertinent findings if present are noted.  Genitourinary: urinary frequency.  Gastrointestinal: nausea and abdominal pain.  Constitutional: feeling tired (fatigue).  Cardiovascular: chest pain.  Musculoskeletal: back pain.  Neurological: headache.    Vitals Vital Signs [Data Includes: Last 1 Day]  29Apr2013 12:11PM  BMI Calculated: 31.07 BSA Calculated: 2.07 Height: 5 ft 8 in Weight: 205 lb  29Apr2013 12:09PM  Blood Pressure: 96 / 73 Temperature: 101.1 F Heart Rate: 112  Physical  Exam Constitutional: Well nourished and well developed . In acute distress.  ENT:. The ears and nose are normal in appearance.  Neck: The appearance of the neck is normal and no neck mass is present.  Pulmonary: No respiratory distress and normal respiratory rhythm and effort.  Cardiovascular: Heart rate and rhythm are normal . No peripheral edema.  Abdomen: The abdomen is mildly obese. No masses are palpated. Moderate tenderness in the RUQ is present and tenderness in the RLQ is present. No CVA tenderness. No hernias are palpable. No hepatosplenomegaly noted.  Lymphatics: The supraclavicular nodes are not enlarged or tender.  Skin: Normal skin turgor, no visible rash and no visible skin lesions . Warm and pink.  Neuro/Psych:. Mood and affect are appropriate.    Results/Data  Old records or history reviewed: records from Dr. Cleta Alberts reviewed.  The following images/tracing/specimen were independently visualized:  I have reviewed her CT films and report.  The following clinical lab reports were reviewed:  I have reviewed her labs. She has an elevated WBC and a nit+ urine.  Discussed:  I reviewed her case with Dr. Cleta Alberts. Our office will call to have them get a urine culture.    Assessment Assessed  1. Proximal Ureteral Stone On The Right 592.1 2. Septicemia 038.9   She has a right UPJ stone with urosepsis.   Plan  Septicemia (038.9)  1. CefTRIAXone Sodium 1 GM Injection Solution Reconstituted; INJECT 1  GM Intramuscular; Done:  29Apr2013 01:24PM; Status: COMPLETE 2. Gentamicin Sulfate 40 MG/ML Injection Solution; INJECT 80 MG Intramuscular; Done:  29Apr2013  01:26PM; Status: COMPLETE   I gave her Rocephin 1gm and Gentamycin 80mg  in the office today. She will be scheduled for an urgent cystoscopy and right stenting today and will be set up for ESWL next week.   I reviewed the risks of both procedures in detail today including bleeding, infection, ureteral and renal injury, injury to adjacent  structures, thrombotic events and anesthetic and sedation complications as well as possible need for second procedures for retained fragments.    Follow-up Schedule Surgery Office  Follow-up  Status: Hold For - Appointment  Requested for: 29Apr2013 Ordered; For: Septicemia (038.9); Ordered By: Robin Chen  Performed:   Due: 01May2013 Marked Important   Discussion/Summary  CC: Dr. Elgie Congo.

## 2012-01-08 NOTE — Patient Instructions (Signed)
Please go straight to [facility] for an evaluation by Dr. Annabell Howells.

## 2012-01-09 DIAGNOSIS — A419 Sepsis, unspecified organism: Secondary | ICD-10-CM | POA: Diagnosis present

## 2012-01-09 DIAGNOSIS — D696 Thrombocytopenia, unspecified: Secondary | ICD-10-CM | POA: Diagnosis present

## 2012-01-09 DIAGNOSIS — N200 Calculus of kidney: Secondary | ICD-10-CM | POA: Diagnosis present

## 2012-01-09 DIAGNOSIS — N289 Disorder of kidney and ureter, unspecified: Secondary | ICD-10-CM | POA: Diagnosis present

## 2012-01-09 DIAGNOSIS — N390 Urinary tract infection, site not specified: Secondary | ICD-10-CM | POA: Diagnosis present

## 2012-01-09 LAB — GENTAMICIN LEVEL, RANDOM: Gentamicin Rm: 3.1 ug/mL

## 2012-01-09 LAB — BASIC METABOLIC PANEL
Calcium: 8.3 mg/dL — ABNORMAL LOW (ref 8.4–10.5)
Creatinine, Ser: 0.77 mg/dL (ref 0.50–1.10)
GFR calc Af Amer: >90 mL/min (ref >90)
Sodium: 137 mEq/L (ref 135–145)

## 2012-01-09 LAB — CBC
Platelets: 76 10*3/uL — ABNORMAL LOW (ref 150–400)
RBC: 4.1 MIL/uL (ref 3.87–5.11)
RDW: 13.6 % (ref 11.5–15.5)
WBC: 7.4 10*3/uL (ref 4.0–10.5)

## 2012-01-09 NOTE — Progress Notes (Signed)
ANTIBIOTIC CONSULT NOTE - Follow Up  Pharmacy Consult for Gentamicin Indication: Urosepsis  Allergies  Allergen Reactions  . Penicillins Rash    Patient Measurements: Height: 5\' 8"  (172.7 cm) Weight: 208 lb (94.348 kg) IBW/kg (Calculated) : 63.9  Adjusted Body Weight: 76kg  Vital Signs: Temp: 98.6 F (37 C) (04/30 1403) Temp src: Oral (04/30 1403) BP: 117/67 mmHg (04/30 1403) Pulse Rate: 82  (04/30 1403) Intake/Output from previous day: 04/29 0701 - 04/30 0700 In: 2293 [I.V.:2130; IV Piggyback:163] Out: 1025 [Urine:1025] Intake/Output from this shift:    Labs:  Basename 01/09/12 0520 01/08/12 1435 01/08/12 1047 01/07/12 1227  WBC 7.4 12.1* 14.6* --  HGB 11.8* 12.8 12.8 --  PLT 76* 78* -- --  LABCREA -- -- -- --  CREATININE 0.77 0.81 -- 1.84*   Estimated Creatinine Clearance: 99.9 ml/min (by C-G formula based on Cr of 0.77).   Microbiology: Recent Results (from the past 720 hour(s))  URINE CULTURE     Status: Normal (Preliminary result)   Collection Time   01/07/12  2:49 PM      Component Value Range Status Comment   Preliminary Report Culture reincubated for better growth   Preliminary     Medical History: Past Medical History  Diagnosis Date  . Hypertension   . COPD (chronic obstructive pulmonary disease)   . Anemia   . Headache   . Chronic kidney disease     Rt UPJ stone  . Blood transfusion     2003    Medications:  Scheduled:     . cefTRIAXone (ROCEPHIN)  IV  1 g Intravenous Q24H  . docusate sodium  100 mg Oral BID  . gentamicin  520 mg Intravenous Q24H   Assessment:  50 YOF admitted for urgent cystoscopy with stent placement for proximal urethral stone & sepsis on 4/29.  Was given ceftriaxone 1gm and gentamicin 80mg  IM in the urology office ~1pm on 4/29  Day #2 Gentamicin/Rocephin for urosepsis  Random gentamicin level (9hrs after dose) = 3.1   Goal of Therapy:  Eradication of infection Levels per [place] Nomogram  Plan:    Per [place] nomogram, continue gentamicin 520mg  IV q24h (~7mg /kg adjusted body weight)  Follow renal function, cultures if available   Hessie Knows, PharmD, BCPS Pager [phone] 01/09/2012 2:13 PM

## 2012-01-09 NOTE — Progress Notes (Signed)
Patient ID: Robin Chen, female   DOB: October 05, 1960, 51 y.o.   MRN: [id number] 1 Day Post-Op  Subjective: Robin Chen is doing better following stenting for a RUPJ stone with urosepsis with decreased pain and a Tmax of 100.3 overnight.   She has minimal hematuria and no voiding complaints.  Her WBC count has normalized but her platelet count remains low.   Her Cr. Was 1.85 on admission labs. ROS: Negative except as above.  She has no nausea.  Objective: Vital signs in last 24 hours: Temp:  [98.4 F (36.9 C)-100.3 F (37.9 C)] 98.4 F (36.9 C) (04/30 0600) Pulse Rate:  [77-118] 77  (04/30 0600) Resp:  [14-23] 18  (04/30 0600) BP: (93-128)/(48-72) 105/64 mmHg (04/30 0600) SpO2:  [92 %-100 %] 96 % (04/30 0600) Weight:  [93.441 kg (206 lb)-94.348 kg (208 lb)] 94.348 kg (208 lb) (04/29 1752)  Intake/Output from previous day: 04/29 0701 - 04/30 0700 In: 2293 [I.V.:2130; IV Piggyback:163] Out: 1025 [Urine:1025] Intake/Output this shift:    General appearance: alert and no distress Resp: clear to auscultation bilaterally Cardio: regular rate and rhythm GI: She has mild right CVAT and RLQ tenderness.  Lab Results:   Basename 01/09/12 0520 01/08/12 1435  WBC 7.4 12.1*  HGB 11.8* 12.8  HCT 36.0 38.0  PLT 76* 78*   BMET  Basename 01/09/12 0520 01/08/12 1435  NA 137 137  K 3.9 3.5  CL 103 104  CO2 23 23  GLUCOSE 113* 95  BUN 18 30*  CREATININE 0.77 0.81  CALCIUM 8.3* 8.2*   PT/INR No results found for this basename: LABPROT:2,INR:2 in the last 72 hours ABG No results found for this basename: PHART:2,PCO2:2,PO2:2,HCO3:2 in the last 72 hours  Studies/Results: Dg Chest 2 View  01/08/2012  *RADIOLOGY REPORT*  Clinical Data: Preop  CHEST - 2 VIEW  Comparison: None.  Findings: Cardiomediastinal silhouette is unremarkable.  No acute infiltrate or pleural effusion.  No pulmonary edema.  Mild tenting of the right hemidiaphragm.  IMPRESSION: No active disease.  Original Report  Authenticated By: Natasha Mead, M.D.   Ct Abdomen Pelvis W Contrast  01/08/2012  **ADDENDUM** CREATED: 01/08/2012 11:48:53  In the body and conclusion of the report UVJ should read UPJ.  **END ADDENDUM** SIGNED BY: Cyndie Chime, M.D.    01/07/2012  *RADIOLOGY REPORT*  Clinical Data: Leukocytosis, Vomiting, right lower quadrant abdominal pain  CT ABDOMEN AND PELVIS WITH CONTRAST  Technique:  Multidetector CT imaging of the abdomen and pelvis was performed following the standard protocol during bolus administration of intravenous contrast.  Contrast: OMNIPAQUE IOHEXOL 300 MG/ML  SOLN  Comparison: Pelvic ultrasound - 12/22/2008  Findings:  Normal hepatic contour.  There is overall decreased attenuation of the hepatic parenchyma most suggestive of hepatic steatosis. Dystrophic calcification within the dome of the right liver third of the score of prior granulomas infection.  Normal gallbladder. No intra or extrahepatic biliary ductal dilatation.  No ascites.  There is symmetric enhancement and excretion of the bilateral kidneys.  There is an approximately 8 x 7 x 10 mm stone within the right UVJ which is associated with mild pelvicaliectasis and asymmetric right-sided perinephric stranding.  There is minimal right-sided downstream ureterectasis and mild uroepithelial enhancement however no distal ureteral stones are identified. There is no left-sided renal stones or evidence of left-sided hydronephrosis.  No discrete renal mass.  Normal appearance of the bilateral adrenal glands, pancreas and spleen.  Incidental note is made of a very small splenule.  Ingested  enteric contrast extends to the level of the ascending colon.  No evidence of enteric obstruction.  Scattered minimal colonic diverticulosis without evidence of diverticulitis.  The bowel is otherwise normal in course and caliber without wall thickening or evidence of obstruction.  Normal appendix.  No pneumoperitoneum, pneumatosis or portal venous [place].   Minimal noncalcified atherosclerotic plaque within the nonaneurysmal abdominal aorta.  The major branch vessels of the abdominal aorta, including the IMA, are patent.  Scattered shoddy retroperitoneal lymph nodes are not enlarged by CT criteria.  Minimal amount of fluid within the endometrial canal, likely physiologic.  There are two right-sided hypoattenuating adnexal lesions, the largest of which measures 2.3 cm in diameter (image 79, series 2) and a left-sided 2.4 cm adnexal lesion, likely representing physiologic cysts.  Dystrophic calcification is also incidentally noted within the right adnexa, possibly a partially thrombosed gonadal vein.  Limited visualization of the lower thorax is negative for focal airspace opacity or pleural effusion.  Normal heart size.  No pericardial effusion.  No acute or aggressive osseous abnormalities.  Multilevel lumbar spine, worse at L1 - L2 and L3 - L4.  IMPRESSION: 1.  Partially obstructing 1 cm stone within the right UVJ results in mild right sided pelvocaliectasis and perinephric stranding.  2.  Minimal right-sided downstream ureterectasis and mild right sided uroepithelial enhancement however no distal ureteral stones are identified.  Nonspecific though worrisome for infection. Correlation with urinalysis is recommended.  3.  Findings suggestive of hepatic steatosis.  This was made a call report.  Original Report Authenticated By: P. Loralie Champagne, M.D.    Anti-infectives: Anti-infectives     Start     Dose/Rate Route Frequency Ordered Stop   01/08/12 2100   gentamicin (GARAMYCIN) 520 mg in dextrose 5 % 100 mL IVPB        520 mg 113 mL/hr over 60 Minutes Intravenous Every 24 hours 01/08/12 1908     01/08/12 2000   cefTRIAXone (ROCEPHIN) 1 g in dextrose 5 % 50 mL IVPB        1 g 100 mL/hr over 30 Minutes Intravenous Every 24 hours 01/08/12 1856            Current Facility-Administered Medications  Medication Dose Route Frequency Provider Last Rate Last  Dose  . acetaminophen (TYLENOL) tablet 650 mg  650 mg Oral Q4H PRN Anner Crete, MD   650 mg at 01/08/12 2125  . bisacodyl (DULCOLAX) suppository 10 mg  10 mg Rectal Daily PRN Anner Crete, MD      . cefTRIAXone (ROCEPHIN) 1 g in dextrose 5 % 50 mL IVPB  1 g Intravenous Q24H Anner Crete, MD   1 g at 01/08/12 2011  . dextrose 5 % and 0.45 % NaCl with KCl 20 mEq/L infusion   Intravenous Continuous Anner Crete, MD 100 mL/hr at 01/08/12 2011    . docusate sodium (COLACE) capsule 100 mg  100 mg Oral BID Anner Crete, MD   100 mg at 01/08/12 2125  . gentamicin (GARAMYCIN) 520 mg in dextrose 5 % 100 mL IVPB  520 mg Intravenous Q24H Rollene Fare, PHARMD   520 mg at 01/08/12 2212  . HYDROmorphone (DILAUDID) injection 0.5-1 mg  0.5-1 mg Intravenous Q2H PRN Anner Crete, MD      . hyoscyamine (LEVSIN SL) SL tablet 0.125 mg  0.125 mg Oral Q4H PRN Anner Crete, MD      . ondansetron [facility]) injection 4 mg  4  mg Intravenous Q4H PRN Anner Crete, MD      . oxyCODONE-acetaminophen (PERCOCET) 5-325 MG per tablet 1-2 tablet  1-2 tablet Oral Q4H PRN Anner Crete, MD      . zolpidem [facility]) tablet 5 mg  5 mg Oral QHS PRN,MR X 1 Anner Crete, MD      . DISCONTD: diatrizoate meglumine (CYSTOGRAFIN/HYPAQUE) 30 % solution    PRN Anner Crete, MD   5 mL at 01/08/12 1604  . DISCONTD: fentaNYL (SUBLIMAZE) injection 25-50 mcg  25-50 mcg Intravenous Q5 min PRN Azell Der, MD   25 mcg at 01/08/12 1651  . DISCONTD: lactated ringers infusion   Intravenous Continuous Azell Der, MD 125 mL/hr at 01/08/12 1735    . DISCONTD: promethazine (PHENERGAN) injection 6.25-12.5 mg  6.25-12.5 mg Intravenous Q15 min PRN Azell Der, MD      . DISCONTD: sterile water for irrigation for irrigation    PRN Anner Crete, MD   300 mL at 01/08/12 1603   Facility-Administered Medications Ordered in Other Encounters  Medication Dose Route Frequency Provider Last Rate Last Dose  . mupirocin ointment (BACTROBAN) 2 %           .  DISCONTD: fentaNYL (SUBLIMAZE) injection    PRN Paris Lore, CRNA   25 mcg at 01/08/12 1612  . DISCONTD: lidocaine (cardiac) 100 mg/55ml (XYLOCAINE) 20 MG/ML injection 2%    PRN Paris Lore, CRNA   50 mg at 01/08/12 1553  . DISCONTD: midazolam (VERSED) 5 MG/5ML injection    PRN Paris Lore, CRNA   2 mg at 01/08/12 1545  . DISCONTD: phenylephrine (NEO-SYNEPHRINE) injection    PRN Paris Lore, CRNA   40 mcg at 01/08/12 1605  . DISCONTD: propofol (DIPRIVAN) 10 mg/mL bolus/IV push    PRN Paris Lore, CRNA   150 mg at 01/08/12 1553    Assessment: s/p Procedure(s): CYSTOSCOPY WITH STENT PLACEMENT  She is improving post stent insertion and IV antibiotics but she has persistent thrombocytopenia and had ARI on admission.  Plan: I am going to continue IV antibiotics another 24 hours and will repeat a CBC and BMET in the AM. She will need ESWL, but we will need to wait until the thrombocytopenia resolves.   LOS: 1 day    Palak Tercero J 01/09/2012

## 2012-01-09 NOTE — Progress Notes (Signed)
   CARE MANAGEMENT NOTE 01/09/2012  Patient:  HELEM, REESOR   Account Number:  [id number]  Date Initiated:  01/09/2012  Documentation initiated by:  Jiles Crocker  Subjective/Objective Assessment:   ADMITTED WITH SEPTICEMIA, URETHERAL STONE     Action/Plan:   PATIENT IS VISITING FROM OUT OF TOWN; INDEPENDENT PRIOR TO ADMISSION   Anticipated DC Date:  01/10/2012   Anticipated DC Plan:  HOME/SELF CARE  In-house referral  Interpreting Services            Status of service:  In process, will continue to follow Medicare Important Message given?   (If response is "NO", the following Medicare IM given date fields will be blank)  Per UR Regulation:  Reviewed for med. necessity/level of care/duration of stay   Comments:  01/09/2012- B Cesare Sumlin RN, BSN, MHA

## 2012-01-09 NOTE — Op Note (Signed)
NAME:  Robin Chen, Robin Chen                 ACCOUNT NO.:  [id number]  MEDICAL RECORD NO.:  [id number]  LOCATION:  1436                         FACILITY:  [facility]  PHYSICIAN:  Excell Seltzer. Annabell Howells, M.D.    DATE OF BIRTH:  February 16, 1961  DATE OF PROCEDURE:  01/08/2012 DATE OF DISCHARGE:                              OPERATIVE REPORT   PROCEDURES:  Cystoscopy, placement of right double-J stent.  PREOPERATIVE DIAGNOSIS:  Obstructing right ureteropelvic junction stone with urosepsis.  POSTOPERATIVE DIAGNOSIS:  Obstructing right ureteropelvic junction stone with urosepsis.  SURGEON:  Excell Seltzer. Annabell Howells, M.D.  ANESTHESIA:  General.  SPECIMEN:  None.  DRAINS:  6-French x 26 cm double-J stent.  COMPLICATIONS:  None.  INDICATIONS:  Robin Chen is a 51 year old female who presented with right flank pain and fever with vital signs suggestive of urosepsis with a 1 cm right UPJ stone.  She was given Rocephin and gentamicin in the office and returns now for stent placement.  FINDINGS OF PROCEDURE:  She was taken to the operating room, where general anesthetic was induced.  She was placed in lithotomy position. Her perineum and genitalia were prepped with Betadine solution.  She was draped in usual sterile fashion.  Cystoscopy was performed using a 22- [place] scope and 12 degree lens.  Examination revealed a normal urethra. The bladder wall was smooth and pale.  No tumor, stones or inflammation were noted.  Ureteral orifices were unremarkable.  The right ureteral orifice was cannulated with a guidewire which was passed to the kidney.  An opening catheter was then passed over the wire to the kidney.  The wire was removed, a small amount of contrast was passed to ensure intrarenal placement of the guidewire.  The guidewire was then replaced once that was confirmed and the opening catheter was removed.  A 6-French x 26 cm double-J stent without string was placed over the wire to the kidney under fluoroscopic  guidance.  The wire was removed leaving a good coil in the kidney, a good coil in the bladder.  The bladder was drained.  The patient was taken down from lithotomy position.  Her anesthetic was reversed.  She was moved to recovery room in stable condition.  There were no complications.     Excell Seltzer. Annabell Howells, M.D.     JJW/MEDQ  D:  01/08/2012  T:  01/09/2012  Job:  [id number]

## 2012-01-10 LAB — URINE CULTURE

## 2012-01-10 LAB — BASIC METABOLIC PANEL
Calcium: 8.8 mg/dL (ref 8.4–10.5)
Creatinine, Ser: 0.68 mg/dL (ref 0.50–1.10)
GFR calc Af Amer: >90 mL/min (ref >90)

## 2012-01-10 LAB — CBC
MCH: 28.9 pg (ref 26.0–34.0)
MCHC: 33 g/dL (ref 30.0–36.0)
MCV: 87.8 fL (ref 78.0–100.0)
Platelets: 81 10*3/uL — ABNORMAL LOW (ref 150–400)
RDW: 13.6 % (ref 11.5–15.5)
WBC: 5.6 10*3/uL (ref 4.0–10.5)

## 2012-01-10 MED ORDER — OXYCODONE-ACETAMINOPHEN 5-325 MG PO TABS: 1.0000 | ORAL_TABLET | ORAL | Status: AC | PRN

## 2012-01-10 MED FILL — Lidocaine HCl Gel 2%: CUTANEOUS | Qty: 10 | Status: AC

## 2012-01-10 NOTE — Discharge Instructions (Signed)
Lithotripsy for Kidney Stones WHAT ARE KIDNEY STONES? The kidneys filter blood for chemicals the body cannot use. These waste chemicals are eliminated in the urine. They are removed from the body. Under some conditions, these chemicals may become concentrated. When this happens, they form crystals in the urine. When these crystals build up and stick together, stones may form. When these stones block the flow of urine through the urinary tract, they may cause severe pain. The urinary tract is very sensitive to blockage and stretching by the stone. WHAT IS LITHOTRIPSY? Lithotripsy is a treatment that can sometimes help eliminate kidney stones and pain faster. A form of lithotripsy, also known as ESWL (extracorporeal shock wave lithotripsy), is a nonsurgical procedure that helps your body rid itself of the kidney stone with a minimum amount of pain. EWSL is a method of crushing a kidney stone with shock waves. These shock waves pass through your body. They cause the kidney stones to crumble while still in the urinary tract. It is then easier for the smaller pieces of stone to pass in the urine. Lithotripsy usually takes about an hour. It is done in a hospital, a lithotripsy center, or a mobile unit. It usually does not require an overnight stay. Your caregiver will instruct you on preparation for the procedure. Your caregiver will tell you what to expect afterward. LET YOUR CAREGIVER KNOW ABOUT:  Allergies.   Medicines taken including herbs, eye drops, over the counter medicines (including aspirin, aleve, or motrin for treatment of inflammatory conditions) and creams.   Use of steroids (by mouth or creams).   Previous problems with anesthetics or novocaine.   Possibility of pregnancy, if this applies.   History of blood clots (thrombophlebitis).   History of bleeding or blood problems.   Previous surgery.   Other health problems.  RISKS AND COMPLICATIONS Complications of lithotripsy are  uncommon, but include the following:  Infection.   Bleeding of the kidney.   Bruising of the kidney or skin.   Obstruction of the ureter (the passageway from the kidney to the bladder).   Failure of the stone to fragment (break apart).  PROCEDURE A stent (flexible tube with holes) may be placed in your ureter. The ureter is the tube that transports the urine from the kidneys to the bladder. Your caregiver may place a stent before the procedure. This will help keep urine flowing from the kidney if the fragments of the stone block the ureter. You may receive an intravenous (IV) line to give you fluids and medicines. These medicines may help you relax or make you sleep. During the procedure, you will lie comfortably on a fluid-filled cushion or in a warm-water bath. After an x-ray or ultrasound locates your stone, shock waves are aimed at the stone. If you are awake, you may feel a tapping sensation (feeling) as the shock waves pass through your body. If large stone particles remain after treatment, a second procedure may be necessary at a later date. For comfort during the test:  Relax as much as possible.   Try to remain still as much as possible.   Try to follow instructions to speed up the test.   Let your caregiver know if you are uncomfortable, anxious, or in pain.  AFTER THE PROCEDURE  After surgery, you will be taken to the recovery area. A nurse will watch and check your progress. Once you're awake, stable, and taking fluids well, you will be allowed to go home as long as there  are no problems. You may be prescribed antibiotics (medicines that kill germs) to help prevent infection. You may also be prescribed pain medicine if needed. In a week or two, your doctor may remove your stent, if you have one. Your caregiver will check to see whether or not stone particles remain. PASSING THE STONE It may take anywhere from a day to several weeks for the stone particles to leave your body.  During this time, drink at least 8 to 12 eight ounce glasses of water every day. It is normal for your urine to be cloudy or slightly bloody for a few weeks following this procedure. You may even see small pieces of stone in your urine. A slight fever and some pain are also normal. Your caregiver may ask you to strain your urine to collect some stone particles for chemical analysis. If you find particles while straining the urine, save them. Analysis tells you and the caregiver what the stone is made of. Knowing this may help prevent future stones. PREVENTING FUTURE STONES  Drink about 8 to 12, eight-ounce glasses of water every day.   Follow the diet your caregiver recommends.   Take your prescribed medicine.   See your caregiver regularly for checkups.  SEEK IMMEDIATE MEDICAL CARE IF:  You develop an oral temperature above 102 F (38.9 C), or as your caregiver suggests.   Your pain is not relieved by medicine.   You develop nausea (feeling sick to your stomach) and vomiting.   You develop heavy bleeding.   You have difficulty urinating.  Document Released: 08/25/2000 Document Revised: 08/17/2011 Document Reviewed: 06/19/2008 [facility] Patient Information 2012 [place], [place].Ureteral Stent A ureteral stent is a soft plastic tube with multiple holes. The stent is inserted into a ureter to help drain urine from the kidney into the bladder. The tube has a coil on each end to keep it from falling out. One end stays in the kidney. The other end stays in the bladder. A stent cannot be seen from the outside. Usually it does not keep you from going about normal routines. A ureteral stent is used to bypass a blockage in your kidney or ureter. This blockage can be caused by kidney stones, scar tissue, pregnancy, or other causes. It can also be used during treatment to remove a kidney stone or to let a ureter heal after surgery. The stent allows urine to drain from the kidney into the bladder. It is  most often taken out after the blockage has been removed or the ureter has healed. If a stent is needed for a long time, it will be changed every few months. INSERTING THE STENT Your stent is put in by a urologist. This is a medical doctor trained for treating genitourinary (kidney, ureter and bladder) problems. Before your stent is put in, your caregiver may order x-rays or other imaging tests of your kidneys and ureters. The stent is inserted in a hospital or same day surgical center. You can anticipate going home the same day. PROCEDURE  A special x-ray machine called a fluoroscope is used to guide the insertion of your stent. This allows your doctor to make sure the stent is in the correct place.   First you are given anesthesia to keep you comfortable.   Then your doctor inserts a special lighted instrument called a cystoscope into your bladder. This allows your doctor to see the opening to the ureter.   A thin wire is carefully threaded into the bladder and up  the ureter. The stent is inserted over the wire and the wire is then removed.  HOME CARE INSTRUCTIONS   While the stent is in place, you may feel some discomfort. Certain movements may trigger pain or a feeling that you need to urinate. Your caregiver may give you pain medication. Only take over-the-counter or prescription medicines for pain, discomfort, or fever as directed by your caregiver. Do not take aspirin as this can make bleeding worse.   You may be given medications to prevent infection or bladder spasms. Be sure to take all medications as directed.   Drink plenty of fluids.   You may have small amounts of bleeding causing your urine to be slightly red. This is nothing to be concerned about.  REMOVAL OF THE STENT Your stent is left in until the blockage is resolved. This may take two weeks or longer. Before the stent is removed, you may have an x-ray make sure the ureter is open. The stent can be removed by your caregiver  in the office. Medications may be given for comfort. Be sure to keep all follow-up appointments so your caregiver can check that you are healing properly. SEEK IMMEDIATE MEDICAL CARE IF:   Your urine is dark red or has blood clots.   You are incontinent (leaking urine).   You have an oral temperature above 102 F (38.9 C), chills, nausea (feeling sick to your stomach), or vomiting.   Your pain is not relieved by pain medication. Do not take aspirin as this can make bleeding worse.   The end of the stent comes out of the urethra.  Document Released: 08/25/2000 Document Revised: 08/17/2011 Document Reviewed: 08/24/2008 [facility] Patient Information 2012 [place], [place].  I will need for you to come to my office on Monday for blood work and an Museum/gallery curator.    The office will call.

## 2012-01-10 NOTE — Discharge Summary (Addendum)
Physician Discharge Summary  Patient ID: Robin Chen MRN: [id number] DOB/AGE: 1961-06-18 51 y.o.  Admit date: 01/08/2012 Discharge date: 01/10/2012  Admission Diagnoses: RUPJ stone with Urosepsis.  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 Principal Problem:  *Renal stone Active Problems:  Urosepsis  Thrombocytopenia  Acute renal insufficiency   Discharged Condition: good  Hospital Course: Robin Chen was admitted on 4/29 for a RUPJ stone with urosepsis.  Her Cr. Was1.85 and her platelet count was 78.   She had urgent right stenting and was treated with rocephin and gentamycin.  A culture is pending.   She had rapid improvement with normalization of her fever and her Creatinine, but the platelet count remains depressed but is starting to come up and is 81K.   She will be discharged home today and will start Levaquin which she was given in the ER.   I will check on her culture and will have her return to the office on Monday for a CBC and KUB.  She is one for ESWL on 5/9.  Consults: None  Significant Diagnostic Studies: labs: as noted above.  Treatments: surgery: Cystoscopy with stenting.  Discharge Exam: Blood pressure 130/80, pulse 88, temperature 98.2 F (36.8 C), temperature source Oral, resp. rate 16, height 5\' 8"  (1.727 m), weight 94.348 kg (208 lb), SpO2 98.00%. Gen: WD,WN in NAD. GI:  Mild residual right CVAT.  Disposition: Home    Medication List  As of 01/10/2012  8:23 AM   STOP taking these medications         naproxen sodium 220 MG tablet         TAKE these medications         ferrous fumarate 325 (106 FE) MG Tabs   Commonly known as: HEMOCYTE - 106 mg FE   Take 1 tablet by mouth daily.      ibuprofen 400 MG tablet   Commonly known as: ADVIL,MOTRIN   Take 400 mg by mouth every 6 (six) hours as needed. For pain relief      levofloxacin 500 MG tablet   Commonly known as: LEVAQUIN   Take 1 tablet (500 mg total) by mouth daily.      ondansetron  4 MG disintegrating tablet   Commonly known as: ZOFRAN-ODT   Take 1 tablet (4 mg total) by mouth every 8 (eight) hours as needed for nausea.      oxyCODONE-acetaminophen 5-325 MG per tablet   Commonly known as: PERCOCET   Take 1-2 tablets by mouth every 4 (four) hours as needed.           Follow-up Information    Follow up with Anner Crete, MD. (My office will call to set up a blood draw and x-ray on Monday.)    Contact information:   [address] [place] [address] [place] [address] [phone]          Signed: Anner Crete 01/10/2012, 8:23 AM  Her culture grew >100000 col of staph but the sensitivities are pending.

## 2012-01-16 ENCOUNTER — Other Ambulatory Visit: Payer: Self-pay | Admitting: Urology

## 2012-01-16 ENCOUNTER — Encounter (HOSPITAL_COMMUNITY): Payer: Self-pay | Admitting: *Deleted

## 2012-01-16 NOTE — Pre-Procedure Instructions (Signed)
Patient instructed to arrive in [facility] at 0645, with blue folder, driver, insurance info, to follow laxative instructions in blue folder the evening before litho, to avoid taking any aspirin, motrin etc prior to litho. NPO after midnight except meds with a sip if needed. Patient verbalized understanding of instructions.

## 2012-01-17 NOTE — Progress Notes (Signed)
Called phone # and spoke with daughter to inform her of time change of ESWL on 01/18/12.  Procedure it at 1100 and patient is to arrive at [facility] at 0900 and to follow all the previous instructions for preparation for ESWL. Daughter verbalized understanding and she will relate message to her mother

## 2012-01-18 ENCOUNTER — Ambulatory Visit (HOSPITAL_COMMUNITY): Payer: BC Managed Care – PPO

## 2012-01-18 ENCOUNTER — Encounter (HOSPITAL_COMMUNITY)
Admission: RE | Disposition: A | Discharge status: Home / Self Care | Payer: Self-pay | Source: Ambulatory Visit | Attending: Urology

## 2012-01-18 ENCOUNTER — Ambulatory Visit (HOSPITAL_COMMUNITY)
Admission: RE | Admit: 2012-01-18 | Discharge: 2012-01-18 | Disposition: A | Discharge status: Home / Self Care | Payer: BC Managed Care – PPO | Source: Ambulatory Visit | Attending: Urology | Admitting: Urology

## 2012-01-18 ENCOUNTER — Encounter (HOSPITAL_COMMUNITY): Payer: Self-pay

## 2012-01-18 DIAGNOSIS — F17200 Nicotine dependence, unspecified, uncomplicated: Secondary | ICD-10-CM | POA: Insufficient documentation

## 2012-01-18 DIAGNOSIS — N200 Calculus of kidney: Secondary | ICD-10-CM | POA: Insufficient documentation

## 2012-01-18 LAB — PREGNANCY, URINE: Preg Test, Ur: NEGATIVE

## 2012-01-18 SURGERY — LITHOTRIPSY, ESWL
Anesthesia: LOCAL | Laterality: Right

## 2012-01-18 MED ORDER — SODIUM CHLORIDE 0.9 % IJ SOLN: 3.0000 mL | Freq: Two times a day (BID) | INTRAMUSCULAR | Status: DC

## 2012-01-18 MED ORDER — SODIUM CHLORIDE 0.9 % IV SOLN: 250.0000 mL | INTRAVENOUS | Status: DC | PRN

## 2012-01-18 MED ORDER — ACETAMINOPHEN 650 MG RE SUPP
650.0000 mg | RECTAL | Status: DC | PRN
  Filled 2012-01-18: qty 1

## 2012-01-18 MED ORDER — FENTANYL CITRATE 0.05 MG/ML IJ SOLN: 25.0000 ug | INTRAMUSCULAR | Status: DC | PRN

## 2012-01-18 MED ORDER — DIPHENHYDRAMINE HCL 25 MG PO CAPS
ORAL_CAPSULE | ORAL | Status: AC
  Administered 2012-01-18: 25 mg via ORAL
  Filled 2012-01-18: qty 1

## 2012-01-18 MED ORDER — ONDANSETRON HCL 4 MG/2ML IJ SOLN: 4.0000 mg | Freq: Four times a day (QID) | INTRAMUSCULAR | Status: DC | PRN

## 2012-01-18 MED ORDER — SODIUM CHLORIDE 0.9 % IJ SOLN: 3.0000 mL | INTRAMUSCULAR | Status: DC | PRN

## 2012-01-18 MED ORDER — DIAZEPAM 5 MG PO TABS
ORAL_TABLET | ORAL | Status: AC
  Administered 2012-01-18: 10 mg via ORAL
  Filled 2012-01-18: qty 2

## 2012-01-18 MED ORDER — CIPROFLOXACIN HCL 500 MG PO TABS
500.0000 mg | ORAL_TABLET | ORAL | Status: AC
  Administered 2012-01-18: 500 mg via ORAL

## 2012-01-18 MED ORDER — DIAZEPAM 5 MG PO TABS
10.0000 mg | ORAL_TABLET | ORAL | Status: AC
  Administered 2012-01-18: 10 mg via ORAL

## 2012-01-18 MED ORDER — DEXTROSE-NACL 5-0.45 % IV SOLN
INTRAVENOUS | Status: DC
  Administered 2012-01-18: 09:00:00 via INTRAVENOUS

## 2012-01-18 MED ORDER — DIPHENHYDRAMINE HCL 25 MG PO CAPS
25.0000 mg | ORAL_CAPSULE | ORAL | Status: AC
  Administered 2012-01-18: 25 mg via ORAL

## 2012-01-18 MED ORDER — ACETAMINOPHEN 325 MG PO TABS: 650.0000 mg | ORAL_TABLET | ORAL | Status: DC | PRN

## 2012-01-18 MED ORDER — OXYCODONE HCL 5 MG PO TABS: 5.0000 mg | ORAL_TABLET | ORAL | Status: DC | PRN

## 2012-01-18 MED ORDER — CIPROFLOXACIN HCL 500 MG PO TABS
ORAL_TABLET | ORAL | Status: AC
  Administered 2012-01-18: 500 mg via ORAL
  Filled 2012-01-18: qty 1

## 2012-01-18 NOTE — Discharge Instructions (Signed)
Lithotripsy Care After Refer to this sheet for the next few weeks. These discharge instructions provide you with general information on caring for yourself after you leave the hospital. Your caregiver may also give you specific instructions. Your treatment has been planned according to the most current medical practices available, but unavoidable complications sometimes occur. If you have any problems or questions after discharge, please call your caregiver. AFTER THE PROCEDURE   The recovery time will vary with the procedure done.   You will be taken to the recovery area. A nurse will watch and check your progress. Once you are awake, stable, and taking fluids well, you will be allowed to go home as long as there are no problems.   Your urine may have a red tinge for a few days after treatment. Blood loss is usually minimal.   You may have soreness in the back or flank area. This usually goes away after a few days. The procedure can cause blotches or bruises on the back where the pressure wave enters the skin. These marks usually cause only minimal discomfort and should disappear in a short time.   Stone fragments should begin to pass within 24 hours of treatment. However, a delayed passage is not unusual.   You may have pain, discomfort, and feel sick to your stomach (nauseous) when the crushed (pulverized) fragments of stone are passed down the tube from the kidney to the bladder. Stone fragments can pass soon after the procedure and may last for up to 4 to 8 weeks.   A small number of patients may have severe pain when stone fragments are not able to pass, which leads to an obstruction.   If your stone is greater than 1 inch/2.5 centimeters in diameter or if you have multiple stones that have a combined diameter greater than 1 inch/2.5 centimeters, you may require more than 1 treatment.   You must have someone drive you home.  HOME CARE INSTRUCTIONS   Rest at home until you feel your  energy improving.   Only take over-the-counter or prescription medicines for pain, discomfort, or fever as directed by your caregiver. Depending on the type of lithotripsy, you may need to take medicines (antibiotics) that kill germs and anti-inflammatory medicines for a few days.   Drink enough water and fluids to keep your urine clear or pale yellow. This helps "flush" your kidneys. It helps pass any remaining pieces of stone and prevents stones from coming back.   Most people can resume daily activities within 1 or 2 days after standard lithotripsy. It can take longer to recover from laser and percutaneous lithotripsy.   If the stones are in your urinary system, you may be asked to strain your urine at home to look for stones. Any stones that are found can be sent to a medical lab for examination.   Visit your caregiver for a follow-up appointment in a few weeks. Your doctor may remove your stent if you have one. Your caregiver will also check to see whether stone particles still remain.  SEEK MEDICAL CARE IF:   You have an oral temperature above 102 F (38.9 C).   Your pain is not relieved by medicine.   You have a lasting nauseous feeling.   You feel there is too much blood in the urine.   You develop persistent problems with frequent and/or painful urination that does not at least partially improve after 2 days following the procedure.   You have a congested   cough.   You feel lightheaded.   You develop a rash or any other signs that might suggest an allergic problem.   You develop any reaction or side effects to your medicine(s).  SEEK IMMEDIATE MEDICAL CARE IF:   You experience severe back and/or flank pain.   You see nothing but blood when you urinate.   You cannot pass any urine at all.   You have an oral temperature above 102 F (38.9 C), not controlled by medicine.   You develop shortness of breath, difficulty breathing, or chest pain.   You develop vomiting  that will not stop after 6 to 8 hours.   You have a fainting episode.  MAKE SURE YOU:   Understand these instructions.   Will watch your condition.   Will get help right away if you are not doing well or get worse.  Document Released: 09/17/2007 Document Revised: 08/17/2011 Document Reviewed: 09/17/2007 [employer] Patient Information 2012 [employer]. 

## 2012-01-18 NOTE — Interval H&P Note (Signed)
History and Physical Interval Note:  01/18/2012 8:01 AM  Robin Chen  has presented today for surgery, with the diagnosis of Right Ureteral Pelvic Junction Stone  The various methods of treatment have been discussed with the patient and family. After consideration of risks, benefits and other options for treatment, the patient has consented to  Procedure(s) (LRB): EXTRACORPOREAL SHOCK WAVE LITHOTRIPSY (ESWL) (Right) as a surgical intervention .  The patients' history has been reviewed, patient examined, no change in status, stable for surgery.  I have reviewed the patients' chart and labs.  Questions were answered to the patient's satisfaction.     [name] J

## 2012-01-18 NOTE — H&P (View-Only) (Signed)
ctive Problems Problems  1. Proximal Ureteral Stone On The Right 592.1 2. Septicemia 038.9  History of Present Illness     Robin Chen is a 50 yo WF sent in consultation by Dr. Daub for a right UPJ stone that measures 1cm.  She had the onset of pain about 2 days ago.  The pain has been severe and associated with nausea and vomiting.  She had no hematuria.  She has had chills with some fever today.  She is having a bit of left chest pain.  She has no cardiac history.  She has had no prior stones or urinary infections.  She has had no GU surgery.  Her temp is 101.1.   Past Medical History Problems  1. History of  Anemia 285.9 2. History of  Nephrolithiasis V13.01  Surgical History Problems  1. History of  No Surgical Problems  Current Meds 1. Ferrous Sulfate 325 (65 Fe) MG Oral Tablet; Therapy: (Recorded:29Apr2013) to 2. Ondansetron HCl 4 MG Oral Tablet; Therapy: (Recorded:29Apr2013) to  Allergies Medication  1. Penicillins  Family History Problems  1. Family history of  Family Health Status Number Of Children  Social History Problems    Caffeine Use   Marital History - Currently Married   Occupation:   Tobacco Use 305.1 Denied    History of  Alcohol Use  Review of Systems Genitourinary, constitutional, skin, eye, otolaryngeal, hematologic/lymphatic, cardiovascular, pulmonary, endocrine, musculoskeletal, gastrointestinal, neurological and psychiatric system(s) were reviewed and pertinent findings if present are noted.  Genitourinary: urinary frequency.  Gastrointestinal: nausea and abdominal pain.  Constitutional: feeling tired (fatigue).  Cardiovascular: chest pain.  Musculoskeletal: back pain.  Neurological: headache.    Vitals Vital Signs [Data Includes: Last 1 Day]  29Apr2013 12:11PM  BMI Calculated: 31.07 BSA Calculated: 2.07 Height: 5 ft 8 in Weight: 205 lb  29Apr2013 12:09PM  Blood Pressure: 96 / 73 Temperature: 101.1 F Heart Rate: 112  Physical  Exam Constitutional: Well nourished and well developed . In acute distress.  ENT:. The ears and nose are normal in appearance.  Neck: The appearance of the neck is normal and no neck mass is present.  Pulmonary: No respiratory distress and normal respiratory rhythm and effort.  Cardiovascular: Heart rate and rhythm are normal . No peripheral edema.  Abdomen: The abdomen is mildly obese. No masses are palpated. Moderate tenderness in the RUQ is present and tenderness in the RLQ is present. No CVA tenderness. No hernias are palpable. No hepatosplenomegaly noted.  Lymphatics: The supraclavicular nodes are not enlarged or tender.  Skin: Normal skin turgor, no visible rash and no visible skin lesions . Warm and pink.  Neuro/Psych:. Mood and affect are appropriate.    Results/Data  Old records or history reviewed: records from Dr. Daub reviewed.  The following images/tracing/specimen were independently visualized:  I have reviewed her CT films and report.  The following clinical lab reports were reviewed:  I have reviewed her labs. She has an elevated WBC and a nit+ urine.  Discussed:  I reviewed her case with Dr. Daub. Our office will call to have them get a urine culture.    Assessment Assessed  1. Proximal Ureteral Stone On The Right 592.1 2. Septicemia 038.9   She has a right UPJ stone with urosepsis.   Plan  Septicemia (038.9)  1. CefTRIAXone Sodium 1 GM Injection Solution Reconstituted; INJECT 1  GM Intramuscular; Done:  29Apr2013 01:24PM; Status: COMPLETE 2. Gentamicin Sulfate 40 MG/ML Injection Solution; INJECT 80 MG Intramuscular; Done:   29Apr2013  01:26PM; Status: COMPLETE   I gave her Rocephin 1gm and Gentamycin 80mg in the office today. She will be scheduled for an urgent cystoscopy and right stenting today and will be set up for ESWL next week.   I reviewed the risks of both procedures in detail today including bleeding, infection, ureteral and renal injury, injury to adjacent  structures, thrombotic events and anesthetic and sedation complications as well as possible need for second procedures for retained fragments.    Follow-up Schedule Surgery Office  Follow-up  Status: Hold For - Appointment  Requested for: 29Apr2013 Ordered; For: Septicemia (038.9); Ordered By: Dimetrius Montfort  Performed:   Due: 01May2013 Marked Important   Discussion/Summary  CC: Dr. Stephen Daub.   

## 2012-07-03 ENCOUNTER — Ambulatory Visit: Payer: Self-pay

## 2013-07-10 ENCOUNTER — Ambulatory Visit: Payer: Self-pay | Admitting: General Practice

## 2013-09-12 ENCOUNTER — Ambulatory Visit: Payer: PRIVATE HEALTH INSURANCE

## 2015-06-28 ENCOUNTER — Other Ambulatory Visit: Payer: Self-pay | Admitting: Emergency Medicine

## 2015-06-28 DIAGNOSIS — Z1231 Encounter for screening mammogram for malignant neoplasm of breast: Secondary | ICD-10-CM

## 2015-06-30 ENCOUNTER — Ambulatory Visit
Admission: RE | Admit: 2015-06-30 | Discharge: 2015-06-30 | Disposition: A | Discharge status: Home / Self Care | Payer: No Typology Code available for payment source | Source: Ambulatory Visit | Attending: Emergency Medicine | Admitting: Emergency Medicine

## 2015-06-30 DIAGNOSIS — Z1231 Encounter for screening mammogram for malignant neoplasm of breast: Secondary | ICD-10-CM | POA: Insufficient documentation

## 2016-10-31 ENCOUNTER — Ambulatory Visit: Payer: Self-pay | Admitting: Registered Nurse

## 2016-10-31 VITALS — HR 126 | Temp 97.1°F | Resp 16

## 2016-10-31 DIAGNOSIS — B349 Viral infection, unspecified: Secondary | ICD-10-CM

## 2016-10-31 NOTE — Progress Notes (Signed)
Subjective:    Patient ID: Robin Chen, female    DOB: March 01, 1961, 56 y.o.   MRN: [id number]  55y/o caucasian married female here for evaluation body aches and chills and fatigue.  Unsure if this is related to her anemia would like labs drawn this week to check her anemia.  Coworkers sick with viral illness/exposure.  Stayed in bed/early last night decreased appetite.      Review of Systems  Constitutional: Positive for activity change, appetite change, chills and fatigue. Negative for diaphoresis, fever and unexpected weight change.  HENT: Negative for congestion, dental problem, drooling, ear discharge, ear pain, facial swelling, hearing loss, mouth sores, nosebleeds, postnasal drip, rhinorrhea, sinus pain, sinus pressure, sneezing, sore throat, tinnitus, trouble swallowing and voice change.   Eyes: Negative for photophobia, pain, discharge, redness, itching and visual disturbance.  Respiratory: Negative for cough, choking, chest tightness, shortness of breath, wheezing and stridor.   Cardiovascular: Negative for chest pain, palpitations and leg swelling.  Gastrointestinal: Negative for abdominal distention, abdominal pain, blood in stool, constipation, diarrhea, nausea and vomiting.  Endocrine: Negative for cold intolerance and heat intolerance.  Genitourinary: Negative for difficulty urinating, dysuria and hematuria.  Musculoskeletal: Negative for arthralgias, back pain, gait problem, joint swelling, myalgias, neck pain and neck stiffness.  Skin: Negative for color change, pallor, rash and wound.  Allergic/Immunologic: Negative for environmental allergies and food allergies.  Neurological: Negative for dizziness, tremors, seizures, syncope, facial asymmetry, speech difficulty, weakness, light-headedness, numbness and headaches.  Hematological: Negative for adenopathy. Does not bruise/bleed easily.  Psychiatric/Behavioral: Negative for agitation, behavioral problems, confusion and sleep  disturbance.       Objective:   Physical Exam  Constitutional: She is oriented to person, place, and time. She appears well-developed and well-nourished. She is active and cooperative.  Non-toxic appearance. She does not have a sickly appearance. She does not appear ill. No distress.  HENT:  Head: Normocephalic and atraumatic.  Right Ear: Hearing, external ear and ear canal normal. A middle ear effusion is present.  Left Ear: Hearing, external ear and ear canal normal. A middle ear effusion is present.  Nose: Mucosal edema and rhinorrhea present. No nose lacerations, sinus tenderness, nasal deformity, septal deviation or nasal septal hematoma. No epistaxis.  No foreign bodies. Right sinus exhibits no maxillary sinus tenderness and no frontal sinus tenderness. Left sinus exhibits no maxillary sinus tenderness and no frontal sinus tenderness.  Mouth/Throat: Uvula is midline and mucous membranes are normal. Mucous membranes are not pale, not dry and not cyanotic. She does not have dentures. No oral lesions. No trismus in the jaw. Normal dentition. No dental abscesses, uvula swelling, lacerations or dental caries. Posterior oropharyngeal edema and posterior oropharyngeal erythema present. No oropharyngeal exudate or tonsillar abscesses.  Eyes: Conjunctivae, EOM and lids are normal. Pupils are equal, round, and reactive to light. Right eye exhibits no chemosis, no discharge, no exudate and no hordeolum. No foreign body present in the right eye. Left eye exhibits no chemosis, no discharge, no exudate and no hordeolum. No foreign body present in the left eye. Right conjunctiva is not injected. Right conjunctiva has no hemorrhage. Left conjunctiva is not injected. Left conjunctiva has no hemorrhage. No scleral icterus. Right eye exhibits normal extraocular motion and no nystagmus. Left eye exhibits normal extraocular motion and no nystagmus. Right pupil is round and reactive. Left pupil is round and reactive.  Pupils are equal.  Neck: Trachea normal and normal range of motion. Neck supple. No tracheal tenderness, no spinous  process tenderness and no muscular tenderness present. No neck rigidity. No tracheal deviation, no edema, no erythema and normal range of motion present. No thyroid mass and no thyromegaly present.  Cardiovascular: Normal rate, regular rhythm, S1 normal, S2 normal, normal heart sounds and intact distal pulses.  PMI is not displaced.  Exam reveals no gallop and no friction rub.   No murmur heard. Pulmonary/Chest: Effort normal and breath sounds normal. No accessory muscle usage or stridor. No respiratory distress. She has no decreased breath sounds. She has no wheezes. She has no rhonchi. She has no rales. She exhibits no tenderness.  Abdominal: Soft. She exhibits no distension.  Musculoskeletal: Normal range of motion. She exhibits no edema or tenderness.       Right shoulder: Normal.       Left shoulder: Normal.       Right hip: Normal.       Left hip: Normal.       Right knee: Normal.       Left knee: Normal.       Cervical back: Normal.       Right hand: Normal.       Left hand: Normal.  Lymphadenopathy:       Head (right side): No submental, no submandibular, no tonsillar, no preauricular, no posterior auricular and no occipital adenopathy present.       Head (left side): No submental, no submandibular, no tonsillar, no preauricular, no posterior auricular and no occipital adenopathy present.    She has no cervical adenopathy.       Right cervical: No superficial cervical, no deep cervical and no posterior cervical adenopathy present.      Left cervical: No superficial cervical, no deep cervical and no posterior cervical adenopathy present.  Neurological: She is alert and oriented to person, place, and time. She has normal strength. She is not disoriented. She displays no atrophy and no tremor. No cranial nerve deficit or sensory deficit. She exhibits normal muscle tone. She  displays no seizure activity. Coordination and gait normal. GCS eye subscore is 4. GCS verbal subscore is 5. GCS motor subscore is 6.  Skin: Skin is warm, dry and intact. No abrasion, no bruising, no burn, no ecchymosis, no laceration, no lesion, no petechiae and no rash noted. She is not diaphoretic. No cyanosis or erythema. No pallor. Nails show no clubbing.  Psychiatric: She has a normal mood and affect. Her speech is normal and behavior is normal. Judgment and thought content normal. Cognition and memory are normal.  Nursing note and vitals reviewed.         Assessment & Plan:  A- viral illness  P-Suspect Viral illness: no evidence of invasive bacterial infection, non toxic and well hydrated.  This is most likely self limiting viral infection.  I do not see where any further testing or imaging is necessary at this time.   I will suggest supportive care, rest, good hygiene and encourage the patient to take adequate fluids.  Does not require work excuse. nasal saline 1-2 sprays each nostril prn q2h, tylenol 1000mg  po Q6h prn pain.  Discussed honey with lemon and salt water gargles for comfort also.  Hydrate hydrate hydrate.    The patient is to return to clinic or EMERGENCY ROOM if symptoms worsen or change significantly e.g. fever, lethargy, SOB, wheezing.Follow up with clinic RN 22 Feb for lab draws ordered CBC, CMP, lipids, TSH, HgbA1c.  See paper chart last labs 2015 low platelets, elevated HgbA1c. Patient  verbalized agreement and understanding of treatment plan and had no further questions at this time

## 2016-10-31 NOTE — Patient Instructions (Signed)
Tylenol 1000mg  by mouth every 6 hours as needed for pain Hydrate Rest If fever stay home x 24 hours off NSAIDS and tylenol as probably infectious could spread to coworkers Eat healthy diet

## 2016-11-02 ENCOUNTER — Other Ambulatory Visit: Payer: Self-pay | Admitting: *Deleted

## 2016-11-06 ENCOUNTER — Emergency Department (HOSPITAL_COMMUNITY)
Admission: EM | Admit: 2016-11-06 | Discharge: 2016-11-07 | Disposition: A | Discharge status: Home / Self Care | Payer: No Typology Code available for payment source | Attending: Emergency Medicine | Admitting: Emergency Medicine

## 2016-11-06 ENCOUNTER — Emergency Department (HOSPITAL_COMMUNITY): Payer: No Typology Code available for payment source

## 2016-11-06 ENCOUNTER — Encounter (HOSPITAL_COMMUNITY): Payer: Self-pay

## 2016-11-06 DIAGNOSIS — D72829 Elevated white blood cell count, unspecified: Secondary | ICD-10-CM | POA: Insufficient documentation

## 2016-11-06 DIAGNOSIS — R509 Fever, unspecified: Secondary | ICD-10-CM | POA: Diagnosis not present

## 2016-11-06 DIAGNOSIS — R7989 Other specified abnormal findings of blood chemistry: Secondary | ICD-10-CM | POA: Diagnosis not present

## 2016-11-06 DIAGNOSIS — R112 Nausea with vomiting, unspecified: Secondary | ICD-10-CM | POA: Insufficient documentation

## 2016-11-06 DIAGNOSIS — F17210 Nicotine dependence, cigarettes, uncomplicated: Secondary | ICD-10-CM | POA: Insufficient documentation

## 2016-11-06 DIAGNOSIS — Z79899 Other long term (current) drug therapy: Secondary | ICD-10-CM | POA: Insufficient documentation

## 2016-11-06 DIAGNOSIS — K760 Fatty (change of) liver, not elsewhere classified: Secondary | ICD-10-CM

## 2016-11-06 DIAGNOSIS — R531 Weakness: Secondary | ICD-10-CM | POA: Diagnosis present

## 2016-11-06 DIAGNOSIS — N189 Chronic kidney disease, unspecified: Secondary | ICD-10-CM | POA: Insufficient documentation

## 2016-11-06 DIAGNOSIS — I129 Hypertensive chronic kidney disease with stage 1 through stage 4 chronic kidney disease, or unspecified chronic kidney disease: Secondary | ICD-10-CM | POA: Diagnosis not present

## 2016-11-06 DIAGNOSIS — K700 Alcoholic fatty liver: Secondary | ICD-10-CM | POA: Insufficient documentation

## 2016-11-06 DIAGNOSIS — J449 Chronic obstructive pulmonary disease, unspecified: Secondary | ICD-10-CM | POA: Insufficient documentation

## 2016-11-06 DIAGNOSIS — R945 Abnormal results of liver function studies: Secondary | ICD-10-CM

## 2016-11-06 LAB — COMPREHENSIVE METABOLIC PANEL
ALK PHOS: 165 U/L — AB (ref 38–126)
ALT: 108 U/L — AB (ref 14–54)
AST: 70 U/L — AB (ref 15–41)
Albumin: 4.3 g/dL (ref 3.5–5.0)
Anion gap: 12 (ref 5–15)
BUN: 16 mg/dL (ref 6–20)
CHLORIDE: 101 mmol/L (ref 101–111)
CO2: 21 mmol/L — AB (ref 22–32)
Calcium: 9.3 mg/dL (ref 8.9–10.3)
Creatinine, Ser: 0.82 mg/dL (ref 0.44–1.00)
GFR calc Af Amer: >60 mL/min (ref >60)
Glucose, Bld: 209 mg/dL — ABNORMAL HIGH (ref 65–99)
Potassium: 4.2 mmol/L (ref 3.5–5.1)
Sodium: 134 mmol/L — ABNORMAL LOW (ref 135–145)
Total Bilirubin: 0.8 mg/dL (ref 0.3–1.2)
Total Protein: 7.7 g/dL (ref 6.5–8.1)

## 2016-11-06 LAB — URINALYSIS, ROUTINE W REFLEX MICROSCOPIC
BILIRUBIN URINE: NEGATIVE
Glucose, UA: NEGATIVE mg/dL
HGB URINE DIPSTICK: NEGATIVE
Ketones, ur: NEGATIVE mg/dL
Leukocytes, UA: NEGATIVE
Nitrite: NEGATIVE
PROTEIN: NEGATIVE mg/dL
Specific Gravity, Urine: 1.015 (ref 1.005–1.030)
pH: 7 (ref 5.0–8.0)

## 2016-11-06 LAB — CBC WITH DIFFERENTIAL/PLATELET
Basophils Absolute: 0 10*3/uL (ref 0.0–0.1)
Basophils Relative: 0 %
EOS PCT: 1 %
Eosinophils Absolute: 0.1 10*3/uL (ref 0.0–0.7)
HCT: 42 % (ref 36.0–46.0)
Hemoglobin: 14.7 g/dL (ref 12.0–15.0)
LYMPHS ABS: 0.6 10*3/uL — AB (ref 0.7–4.0)
Lymphocytes Relative: 4 %
MCH: 30.8 pg (ref 26.0–34.0)
MCHC: 35 g/dL (ref 30.0–36.0)
MCV: 87.9 fL (ref 78.0–100.0)
MONOS PCT: 5 %
Monocytes Absolute: 0.8 10*3/uL (ref 0.1–1.0)
Neutro Abs: 13.5 10*3/uL — ABNORMAL HIGH (ref 1.7–7.7)
Neutrophils Relative %: 90 %
PLATELETS: 222 10*3/uL (ref 150–400)
RBC: 4.78 MIL/uL (ref 3.87–5.11)
RDW: 12.1 % (ref 11.5–15.5)
WBC: 15 10*3/uL — AB (ref 4.0–10.5)

## 2016-11-06 LAB — I-STAT CG4 LACTIC ACID, ED: LACTIC ACID, VENOUS: 1.36 mmol/L (ref 0.5–1.9)

## 2016-11-06 LAB — POC URINE PREG, ED: Preg Test, Ur: NEGATIVE

## 2016-11-06 MED ORDER — IOPAMIDOL (ISOVUE-300) INJECTION 61%
100.0000 mL | Freq: Once | INTRAVENOUS | Status: AC | PRN
  Administered 2016-11-06: 100 mL via INTRAVENOUS

## 2016-11-06 MED ORDER — SODIUM CHLORIDE 0.9 % IV BOLUS (SEPSIS)
1000.0000 mL | Freq: Once | INTRAVENOUS | Status: AC
  Administered 2016-11-06: 1000 mL via INTRAVENOUS

## 2016-11-06 MED ORDER — ONDANSETRON HCL 4 MG/2ML IJ SOLN
4.0000 mg | Freq: Once | INTRAMUSCULAR | Status: AC
  Administered 2016-11-06: 4 mg via INTRAVENOUS
  Filled 2016-11-06: qty 2

## 2016-11-06 MED ORDER — IOPAMIDOL (ISOVUE-300) INJECTION 61%
INTRAVENOUS | Status: AC
  Filled 2016-11-06: qty 100

## 2016-11-06 MED ORDER — SODIUM CHLORIDE 0.9 % IV BOLUS (SEPSIS)
1000.0000 mL | Freq: Once | INTRAVENOUS | Status: AC
Start: 2016-11-06 — End: 2016-11-07
  Administered 2016-11-06: 1000 mL via INTRAVENOUS

## 2016-11-06 MED ORDER — ACETAMINOPHEN 325 MG PO TABS
650.0000 mg | ORAL_TABLET | Freq: Once | ORAL | Status: AC | PRN
  Administered 2016-11-06: 650 mg via ORAL
  Filled 2016-11-06: qty 2

## 2016-11-06 NOTE — ED Triage Notes (Signed)
PT C/O WEAKNESS, CHILLS, WITH N/V SINCE 3 PM TODAY. PT STS SHE IS ON ABX FOR A UTI AND HAS 2 MORE DAYS OF PILLS. 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

## 2016-11-06 NOTE — ED Provider Notes (Signed)
[facility] DEPT Provider Note   CSN: [id number] Arrival date & time: 11/06/16  1903     History   Chief Complaint Chief Complaint  Patient presents with  . Weakness  . Emesis    HPI Robin Chen is a 56 y.o. female.  HPI   56 year old female presents today with fever and weakness.  Patient notes symptoms started approximately 8 days ago with generalized weakness.  She was seen by urgent care and diagnosed with urinary tract infection.  She was started on antibiotics and has continued those until today.  Patient notes that yesterday she started to feel better, was able to go back to work today but had return of fever, fatigue.  Patient denies any complaints of respiratory congestion, rhinorrhea, shortness of breath or cough.  She reports she is having minor right lower quadrant pain, only with palpation  She reports some nausea and vomiting, denies any diarrhea or constipation.  He denies any urinary symptoms.  Patient was tested for influenza in urgent care which was negative.  Past Medical History:  Diagnosis Date  . Anemia   . Blood transfusion    2003  . Chronic kidney disease    Rt UPJ stone  . COPD (chronic obstructive pulmonary disease) ([place])   . Headache(784.0)   . Hypertension     Patient Active Problem List   Diagnosis Date Noted  . Urosepsis 01/09/2012  . Renal stone 01/09/2012  . Thrombocytopenia ([place]) 01/09/2012  . Acute renal insufficiency 01/09/2012    Past Surgical History:  Procedure Laterality Date  . CYSTOSCOPY W/ URETERAL STENT PLACEMENT    . DILATION AND CURETTAGE OF UTERUS     2003    OB History    No data available       Home Medications    Prior to Admission medications   Medication Sig Start Date End Date Taking? Authorizing Provider  ibuprofen (ADVIL,MOTRIN) 200 MG tablet Take 200 mg by mouth every 6 (six) hours as needed for headache, mild pain or moderate pain.   Yes Historical Provider, MD  nitrofurantoin,  macrocrystal-monohydrate, (MACROBID) 100 MG capsule Take 100 mg by mouth 2 (two) times daily. 11/01/16 11/08/16 Yes Historical Provider, MD    Family History History reviewed. No pertinent family history.  Social History Social History  Substance Use Topics  . Smoking status: Current Every Day Smoker    Packs/day: 1.00    Years: 30.00    Types: Cigarettes  . Smokeless tobacco: Never Used  . Alcohol use No     Allergies   Penicillins   Review of Systems Review of Systems  All other systems reviewed and are negative.   Physical Exam Updated Vital Signs BP 106/59 (BP Location: Right Arm)   Pulse 103   Temp 98.6 F (37 C) (Oral)   Resp 22   Ht 5' 4"  (1.626 m)   Wt 90.7 kg   SpO2 94%   BMI 34.33 kg/m   Physical Exam  Constitutional: She is oriented to person, place, and time. She appears well-developed and well-nourished.  HENT:  Head: Normocephalic and atraumatic.  Eyes: Conjunctivae are normal. Pupils are equal, round, and reactive to light. Right eye exhibits no discharge. Left eye exhibits no discharge. No scleral icterus.  Neck: Normal range of motion. No JVD present. No tracheal deviation present.  Pulmonary/Chest: Effort normal. No stridor.  Abdominal: Soft. She exhibits no distension.  Minor TTP of RLQ/ RUQ  Neurological: She is alert and oriented to person, place,  and time. Coordination normal.  Psychiatric: She has a normal mood and affect. Her behavior is normal. Judgment and thought content normal.  Nursing note and vitals reviewed.    ED Treatments / Results  Labs (all labs ordered are listed, but only abnormal results are displayed) Labs Reviewed  COMPREHENSIVE METABOLIC PANEL - Abnormal; Notable for the following:       Result Value   Sodium 134 (*)    CO2 21 (*)    Glucose, Bld 209 (*)    AST 70 (*)    ALT 108 (*)    Alkaline Phosphatase 165 (*)    All other components within normal limits  CBC WITH DIFFERENTIAL/PLATELET - Abnormal; Notable  for the following:    WBC 15.0 (*)    Neutro Abs 13.5 (*)    Lymphs Abs 0.6 (*)    All other components within normal limits  CULTURE, BLOOD (ROUTINE X 2)  CULTURE, BLOOD (ROUTINE X 2)  URINALYSIS, ROUTINE W REFLEX MICROSCOPIC  I-STAT CG4 LACTIC ACID, ED  POC URINE PREG, ED    EKG  EKG Interpretation None      Radiology No results found.  Procedures Procedures (including critical care time)  Medications Ordered in ED Medications  iopamidol (ISOVUE-300) 61 % injection (not administered)  sodium chloride 0.9 % bolus 1,000 mL (not administered)  acetaminophen (TYLENOL) tablet 650 mg (650 mg Oral Given 11/06/16 2009)  sodium chloride 0.9 % bolus 1,000 mL (1,000 mLs Intravenous New Bag/Given 11/06/16 2049)  ondansetron (ZOFRAN) injection 4 mg (4 mg Intravenous Given 11/06/16 2049)  iopamidol (ISOVUE-300) 61 % injection 100 mL (100 mLs Intravenous Contrast Given 11/06/16 2159)     Initial Impression / Assessment and Plan / ED Course  I have reviewed the triage vital signs and the nursing notes.  Pertinent labs & imaging results that were available during my care of the patient were reviewed by me and considered in my medical decision making (see chart for details).      Final Clinical Impressions(s) / ED Diagnoses   Final diagnoses:  Fever, unspecified fever cause   Labs: Blood culture, lactic acid, CMP, CBC  Imaging: CT abdomen and pelvis with contrast  Consults:  Therapeutics:  Discharge Meds:   Assessment/Plan: 56 year old female presents today with complaints of fever, weakness and urinary tract infection.  Patient recently finished a course of antibiotics but had return of symptoms today.  Patient has no signs of respiratory infection.  No urinary symptoms, but has very minor right-sided lower quadrant and minor right upper quadrant tenderness to palpation.  Due to fever, significant tachycardia with no other obvious source of infection patient will have CT scan  for further evaluation.  Labs show moderate elevation in white blood cells, AST, ALT, and alk phos.    Patient CARE will be signed out to oncoming provider pending CT analysis and ongoing management.    New Prescriptions New Prescriptions   No medications on file     Okey Regal, PA-C 95/97/47 1855    Delora Fuel, MD 01/58/68 2574

## 2016-11-06 NOTE — ED Provider Notes (Signed)
Care assumed from [facility], PA-C, at shift change. Pt here with fever and N/V today, recent UTI last week; please see prior provider's notes for full history. Results so far show Upreg neg, lactic neg, CMP with mildly bumped AST/ALT/alk phos, CBC with mild neutrophilic leukocytosis. Awaiting CT scan and U/A. Plan is to reassess after CT results, hopefully this will reveal source of fever.    Physical Exam  BP 103/59   Pulse 94   Temp 98.1 F (36.7 C) (Oral)   Resp 23   Ht _0  (1.626 m)   Wt 90.7 kg   SpO2 95%   BMI 34.33 kg/m   Physical Exam Gen: afebrile, VSS, NAD HEENT: EOMI, MMM Resp: no resp distress CV: rate WNL Abd: Soft, NTND, +BS throughout, no r/g/r, neg murphy's, neg mcburney's, no CVA TTP  MsK: moving all extremities with ease Neuro: A&O x4   ED Course  Procedures Results for orders placed or performed during the hospital encounter of 11/06/16  Comprehensive metabolic panel  Result Value Ref Range   Sodium 134 (L) 135 - 145 mmol/L   Potassium 4.2 3.5 - 5.1 mmol/L   Chloride 101 101 - 111 mmol/L   CO2 21 (L) 22 - 32 mmol/L   Glucose, Bld 209 (H) 65 - 99 mg/dL   BUN 16 6 - 20 mg/dL   Creatinine, Ser 0.82 0.44 - 1.00 mg/dL   Calcium 9.3 8.9 - 10.3 mg/dL   Total Protein 7.7 6.5 - 8.1 g/dL   Albumin 4.3 3.5 - 5.0 g/dL   AST 70 (H) 15 - 41 U/L   ALT 108 (H) 14 - 54 U/L   Alkaline Phosphatase 165 (H) 38 - 126 U/L   Total Bilirubin 0.8 0.3 - 1.2 mg/dL   GFR calc non Af Amer >60 >60 mL/min   GFR calc Af Amer >60 >60 mL/min   Anion gap 12 5 - 15  CBC with Differential  Result Value Ref Range   WBC 15.0 (H) 4.0 - 10.5 K/uL   RBC 4.78 3.87 - 5.11 MIL/uL   Hemoglobin 14.7 12.0 - 15.0 g/dL   HCT 42.0 36.0 - 46.0 %   MCV 87.9 78.0 - 100.0 fL   MCH 30.8 26.0 - 34.0 pg   MCHC 35.0 30.0 - 36.0 g/dL   RDW 12.1 11.5 - 15.5 %   Platelets 222 150 - 400 K/uL   Neutrophils Relative % 90 %   Neutro Abs 13.5 (H) 1.7 - 7.7 K/uL   Lymphocytes Relative 4 %   Lymphs Abs  0.6 (L) 0.7 - 4.0 K/uL   Monocytes Relative 5 %   Monocytes Absolute 0.8 0.1 - 1.0 K/uL   Eosinophils Relative 1 %   Eosinophils Absolute 0.1 0.0 - 0.7 K/uL   Basophils Relative 0 %   Basophils Absolute 0.0 0.0 - 0.1 K/uL  Urinalysis, Routine w reflex microscopic  Result Value Ref Range   Color, Urine STRAW (A) YELLOW   APPearance CLEAR CLEAR   Specific Gravity, Urine 1.015 1.005 - 1.030   pH 7.0 5.0 - 8.0   Glucose, UA NEGATIVE NEGATIVE mg/dL   Hgb urine dipstick NEGATIVE NEGATIVE   Bilirubin Urine NEGATIVE NEGATIVE   Ketones, ur NEGATIVE NEGATIVE mg/dL   Protein, ur NEGATIVE NEGATIVE mg/dL   Nitrite NEGATIVE NEGATIVE   Leukocytes, UA NEGATIVE NEGATIVE  I-Stat CG4 Lactic Acid, ED  Result Value Ref Range   Lactic Acid, Venous 1.36 0.5 - 1.9 mmol/L  POC Urine Pregnancy,  ED (do NOT order at [facility])  Result Value Ref Range   Preg Test, Ur NEGATIVE NEGATIVE   Ct Abdomen Pelvis W Contrast  Result Date: 11/06/2016 CLINICAL DATA:  Weakness, chills, nausea and vomiting beginning this afternoon. On antibiotics for urinary tract infection. History of chronic kidney disease and nephrolithiasis. EXAM: CT ABDOMEN AND PELVIS WITH CONTRAST TECHNIQUE: Multidetector CT imaging of the abdomen and pelvis was performed using the standard protocol following bolus administration of intravenous contrast. CONTRAST:  165m ISOVUE-300 IOPAMIDOL (ISOVUE-300) INJECTION 61% COMPARISON:  CT abdomen and pelvis January 07, 2012 FINDINGS: LOWER CHEST: Lung bases are clear. Included heart size is normal. No pericardial effusion. HEPATOBILIARY: Extremely hypodense liver compatible with steatosis with focal fatty sparing about the gallbladder fossa and within caudate lobe. Normal gallbladder. PANCREAS: Normal. SPLEEN: Normal. ADRENALS/URINARY TRACT: Kidneys are orthotopic, demonstrating symmetric enhancement. No nephrolithiasis, hydronephrosis or solid renal masses. The unopacified ureters are normal in course and caliber.  Delayed imaging through the kidneys demonstrates symmetric prompt contrast excretion within the proximal urinary collecting system. Urinary bladder is partially distended and unremarkable. Normal adrenal glands. STOMACH/BOWEL: The stomach, small and large bowel are normal in course and caliber without inflammatory changes, mild calcific atherosclerosis. Normal perirectal appendix. VASCULAR/LYMPHATIC: Aortoiliac vessels are normal in course and caliber. No lymphadenopathy by CT size criteria. REPRODUCTIVE: Normal.  Subcentimeter calcification RIGHT adnexum. OTHER: No intraperitoneal free fluid or free air. MUSCULOSKELETAL: Nonacute. LEFT calcified gluteal injection granuloma. Severe L3-4 degenerative disc resulting in moderate to severe canal stenosis broad-based disc bulge at L4-5 results in moderate canal stenosis. Small fat containing umbilical hernia. IMPRESSION: 1. No acute intra-abdominal or pelvic process. 2. Marked hepatic steatosis. 3. Moderate to severe canal stenosis L3-4. Electronically Signed   By: CElon AlasM.D.   On: 11/06/2016 22:20     Meds ordered this encounter  Medications  . acetaminophen (TYLENOL) tablet 650 mg  . sodium chloride 0.9 % bolus 1,000 mL  . ondansetron (ZOFRAN) injection 4 mg  . nitrofurantoin, macrocrystal-monohydrate, (MACROBID) 100 MG capsule    Sig: Take 100 mg by mouth 2 (two) times daily.  .Marland Kitchenibuprofen (ADVIL,MOTRIN) 200 MG tablet    Sig: Take 200 mg by mouth every 6 (six) hours as needed for headache, mild pain or moderate pain.  . iopamidol (ISOVUE-300) 61 % injection    [place], Brian   : cabinet override  . sodium chloride 0.9 % bolus 1,000 mL  . iopamidol (ISOVUE-300) 61 % injection 100 mL  . sodium chloride 0.9 % bolus 1,000 mL  . ondansetron (ZOFRAN ODT) 4 MG disintegrating tablet    Sig: Take 1 tablet (4 mg total) by mouth every 8 (eight) hours as needed for nausea or vomiting.    Dispense:  15 tablet    Refill:  0    Order Specific Question:    Supervising Provider    Answer:   MNoemi Chapel[[id number]]     MDM:   ICD-9-CM ICD-10-CM   1. Fever, unspecified fever cause 780.60 R50.9     11:17 PM Pt's BP down slightly and almost done with 2L, started another 1L bolus and BP already improving. No abdominal tenderness on my exam, neg murphy's on exam. CT without any acute findings although shows marked hepatic steatosis which could explain the liver enzyme elevations. Pt feeling well at the moment. Awaiting U/A sample, and then will reassess and decide on disposition; if continues to feel improved then likely we could still get her home despite perhaps not having  a great explanation for her fever; hopefully the U/A will reveal a cause. Of note, pt is on macrobid (3 doses left, missed this afternoon's dose) so if U/A reveals that she still has a UTI then may need to consider changing this abx. Will reassess shortly.   12:52 AM U/A actually reassuring, no UTI. Pt appears well, tolerating PO well, no ongoing fever or tachycardia, vitals remain stable. No definite source of infection to explain the fever, however pt with normal lactic and stable appearing, doubt need for further emergent work up or ongoing monitoring. Advised adequate hydration, use of tylenol/motrin for pain/fever, and close PCP f/up in 3-5 days for recheck of symptoms. Strict return precautions advised. Rx for zofran given. I explained the diagnosis and have given explicit precautions to return to the ER including for any other new or worsening symptoms. The patient understands and accepts the medical plan as it's been dictated and I have answered their questions. Discharge instructions concerning home care and prescriptions have been given. The patient is STABLE and is discharged to home in good condition.    [address], PA-C 07/13/10 1735    David Glick, MD 67/01/41 0301

## 2016-11-06 NOTE — ED Notes (Signed)
PA made aware of BP 

## 2016-11-06 NOTE — ED Notes (Signed)
ED Provider at bedside. 

## 2016-11-06 NOTE — ED Notes (Signed)
Per MD order U/A. Pt provided insufficient sample. Encouraged pt to attempt sample at later time; pt confirmed understanding. [employer]

## 2016-11-07 MED ORDER — ONDANSETRON 4 MG PO TBDP: 4.0000 mg | ORAL_TABLET | Freq: Three times a day (TID) | ORAL | 0 refills | Status: DC | PRN

## 2016-11-07 NOTE — ED Notes (Signed)
Patient was alert, oriented and stable upon discharge. RN went over AVS and patient had no further questions.  

## 2016-11-07 NOTE — Discharge Instructions (Signed)
Continue to stay well-hydrated. Continue to alternate between Tylenol and Ibuprofen for pain or fever. Continue your home antibiotic as directed and until completed. Use zofran as directed as needed for nausea. Your work up revealed a fatty liver and mildly elevated liver enzymes, you will need to follow up with your primary care doctor regarding these results. Follow up with your primary care doctor in 5-7 days for recheck of ongoing symptoms. Return to emergency department for emergent changing or worsening of symptoms.

## 2016-11-08 ENCOUNTER — Ambulatory Visit (INDEPENDENT_AMBULATORY_CARE_PROVIDER_SITE_OTHER): Payer: PRIVATE HEALTH INSURANCE | Admitting: Physician Assistant

## 2016-11-08 VITALS — BP 120/72 | HR 85 | Temp 97.8°F | Resp 16 | Ht 64.0 in | Wt 196.2 lb

## 2016-11-08 DIAGNOSIS — R81 Glycosuria: Secondary | ICD-10-CM | POA: Diagnosis not present

## 2016-11-08 DIAGNOSIS — D72829 Elevated white blood cell count, unspecified: Secondary | ICD-10-CM

## 2016-11-08 DIAGNOSIS — Z87440 Personal history of urinary (tract) infections: Secondary | ICD-10-CM

## 2016-11-08 DIAGNOSIS — R945 Abnormal results of liver function studies: Secondary | ICD-10-CM

## 2016-11-08 DIAGNOSIS — R7989 Other specified abnormal findings of blood chemistry: Secondary | ICD-10-CM

## 2016-11-08 DIAGNOSIS — E119 Type 2 diabetes mellitus without complications: Secondary | ICD-10-CM

## 2016-11-08 LAB — POCT CBC
Granulocyte percent: 91.2 %G — AB (ref 37–80)
HCT, POC: 40.7 % (ref 37.7–47.9)
HEMOGLOBIN: 14.3 g/dL (ref 12.2–16.2)
LYMPH, POC: 1.4 (ref 0.6–3.4)
MCH: 31 pg (ref 27–31.2)
MCHC: 35.2 g/dL (ref 31.8–35.4)
MCV: 88 fL (ref 80–97)
MID (cbc): 0.3 (ref 0–0.9)
MPV: 7.5 fL (ref 0–99.8)
PLATELET COUNT, POC: 209 10*3/uL (ref 142–424)
POC Granulocyte: 18.1 — AB (ref 2–6.9)
POC LYMPH PERCENT: 7.3 %L — AB (ref 10–50)
POC MID %: 1.5 % (ref 0–12)
RBC: 4.62 M/uL (ref 4.04–5.48)
RDW, POC: 12.4 %
WBC: 19.8 10*3/uL — AB (ref 4.6–10.2)

## 2016-11-08 LAB — POCT URINALYSIS DIP (MANUAL ENTRY)
Glucose, UA: 100 — AB
LEUKOCYTES UA: NEGATIVE
Nitrite, UA: NEGATIVE
PH UA: 5.5
RBC UA: NEGATIVE
Spec Grav, UA: >=1.03

## 2016-11-08 LAB — POC MICROSCOPIC URINALYSIS (UMFC): Mucus: ABSENT

## 2016-11-08 LAB — POCT GLYCOSYLATED HEMOGLOBIN (HGB A1C): HEMOGLOBIN A1C: 7.8

## 2016-11-08 MED ORDER — CIPROFLOXACIN HCL 500 MG PO TABS: 500.0000 mg | ORAL_TABLET | Freq: Two times a day (BID) | ORAL | 0 refills | Status: DC

## 2016-11-08 NOTE — Progress Notes (Signed)
Robin Chen  MRN: [id number] DOB: 06/25/1961  Subjective:  Robin Chen is a 56 y.o. female seen in office today for a chief complaint of follow up from ED visit 2 days ago.   Pt notes she had a UTI on 11/01/16 and was given macrobid from an urgent care. Notes that two days ago she had vomiting, fever, and chills. Went to ED on 11/06/16 and she states a lot of tests were collected, which she reports were normal. CT of abdomen pelvis was normal. She had 3L of NS IVF and notes she was discharged with rx of zofran and told to follow up with PCP. She does not have a PCP.   Since she left ED two days ago, pt continues to vomit. Has associated belching, heartburn, decreased appetite, and constipation. Denies fever and chills. States her last BM was over 3 days ago.   Of note, she just stopped macrobid yesterday and notes she does feel better today. She has not vomited today.   Review of Systems  Gastrointestinal: Negative for abdominal pain and diarrhea.  Genitourinary: Negative for difficulty urinating, dysuria, flank pain and hematuria.    Patient Active Problem List   Diagnosis Date Noted  . Urosepsis 01/09/2012  . Renal stone 01/09/2012  . Thrombocytopenia ([place]) 01/09/2012  . Acute renal insufficiency 01/09/2012    Current Outpatient Prescriptions on File Prior to Visit  Medication Sig Dispense Refill  . nitrofurantoin, macrocrystal-monohydrate, (MACROBID) 100 MG capsule Take 100 mg by mouth 2 (two) times daily.    . ondansetron (ZOFRAN ODT) 4 MG disintegrating tablet Take 1 tablet (4 mg total) by mouth every 8 (eight) hours as needed for nausea or vomiting. 15 tablet 0  . ibuprofen (ADVIL,MOTRIN) 200 MG tablet Take 200 mg by mouth every 6 (six) hours as needed for headache, mild pain or moderate pain.     No current facility-administered medications on file prior to visit.     Allergies  Allergen Reactions  . Penicillins Rash and Other (See Comments)    Has patient had a PCN  reaction causing immediate rash, facial/tongue/throat swelling, SOB or lightheadedness with hypotension: No Has patient had a PCN reaction causing severe rash involving mucus membranes or skin necrosis: No Has patient had a PCN reaction that required hospitalization No Has patient had a PCN reaction occurring within the last 10 years: No If all of the above answers are "NO", then may proceed with Cephalosporin use.     Objective:  BP 120/72   Pulse 85   Temp 97.8 F (36.6 C) (Oral)   Resp 16   Ht 5' 4"  (1.626 m)   Wt 196 lb 3.2 oz (89 kg)   SpO2 94%   BMI 33.68 kg/m   Physical Exam  Constitutional: She is oriented to person, place, and time and well-developed, well-nourished, and in no distress.  HENT:  Head: Normocephalic and atraumatic.  Eyes: Conjunctivae are normal.  Neck: Normal range of motion.  Cardiovascular: Normal rate, regular rhythm and normal heart sounds.   Pulmonary/Chest: Effort normal and breath sounds normal.  Abdominal: Soft. Normal appearance and bowel sounds are normal. There is tenderness ( mild) in the suprapubic area. There is no rigidity, no rebound, no guarding, no CVA tenderness, no tenderness at McBurney's point and negative Murphy's sign.  Neurological: She is alert and oriented to person, place, and time. Gait normal.  Skin: Skin is warm and dry.  Psychiatric: Affect normal.  Vitals reviewed.  Results for orders placed  or performed in visit on 11/08/16 (from the past 24 hour(s))  POCT CBC     Status: Abnormal   Collection Time: 11/08/16 11:44 AM  Result Value Ref Range   WBC 19.8 (A) 4.6 - 10.2 K/uL   Lymph, poc 1.4 0.6 - 3.4   POC LYMPH PERCENT 7.3 (A) 10 - 50 %L   MID (cbc) 0.3 0 - 0.9   POC MID % 1.5 0 - 12 %M   POC Granulocyte 18.1 (A) 2 - 6.9   Granulocyte percent 91.2 (A) 37 - 80 %G   RBC 4.62 4.04 - 5.48 M/uL   Hemoglobin 14.3 12.2 - 16.2 g/dL   HCT, POC 40.7 37.7 - 47.9 %   MCV 88.0 80 - 97 fL   MCH, POC 31.0 27 - 31.2 pg   MCHC  35.2 31.8 - 35.4 g/dL   RDW, POC 12.4 %   Platelet Count, POC 209 142 - 424 K/uL   MPV 7.5 0 - 99.8 fL  POCT urinalysis dipstick     Status: Abnormal   Collection Time: 11/08/16 11:46 AM  Result Value Ref Range   Color, UA orange (A) yellow   Clarity, UA clear clear   Glucose, UA =100 (A) negative   Bilirubin, UA moderate (A) negative   Ketones, POC UA trace (5) (A) negative   Spec Grav, UA >=1.030    Blood, UA negative negative   pH, UA 5.5    Protein Ur, POC =100 (A) negative   Urobilinogen, UA >=8.0    Nitrite, UA Negative Negative   Leukocytes, UA Negative Negative  POCT Microscopic Urinalysis ([facility])     Status: Abnormal   Collection Time: 11/08/16 11:57 AM  Result Value Ref Range   WBC,UR,HPF,POC Many (A) None WBC/hpf   RBC,UR,HPF,POC None None RBC/hpf   Bacteria Many (A) None, Too numerous to count   Mucus Absent Absent   Epithelial Cells, UR Per Microscopy Moderate (A) None, Too numerous to count cells/hpf  POCT glycosylated hemoglobin (Hb A1C)     Status: None   Collection Time: 11/08/16 12:14 PM  Result Value Ref Range   Hemoglobin A1C 7.8    ED notes and labs have been reviewed.   Assessment and Plan :  This case was precepted with Dr. Mingo Amber.  1. History of UTI -History and PE findings consistent for a UTI that has not fully resolved with macrobid. Will send urine culture. Considering patient is slightly tender in suprapubic region and WBC continue to rise will cover with cipro. Pt informed to return in two days for repeat CBC and UA. Instructed that if she starts to have decreased urine,fever, chills, worsening vomiting/nausea, or severe back pain seek care at the ED immediately.  - POCT urinalysis dipstick - POCT Microscopic Urinalysis ([facility]) - POCT CBC - Urine culture  2. Elevated LFTs Per review of ED notes, AST, ALT, and alk phos were elevated. CT showed liver steatosis. CMP from today pending. If enzymes are the same or elevated, will order RUQ [place].  -  CMP14+EGFR  3. Type 2 diabetes mellitus without complication, without long-term current use of insulin (HCC) -Newly diagnosed. Will talk about treatment and long term management at follow up appointment in two days.   4. Leukocytosis, unspecified type - ciprofloxacin (CIPRO) 500 MG tablet; Take 1 tablet (500 mg total) by mouth 2 (two) times daily.  Dispense: 14 tablet; Refill: 0  5. Glucosuria - POCT glycosylated hemoglobin (Hb A1C)   Tenna Delaine PA-C  [facility] 11/08/2016 12:16 PM

## 2016-11-08 NOTE — Patient Instructions (Addendum)
We will cover your UTI with ciprofloxacin as your white count is continuing to increase. We have sent out a urine culture which should be back in 2 days for us to know what organism we are treating. In the meantime, if you start to have decreased urine production,fever, chills, worsening vomiting/nausea, or severe back pain seek care at the ED immediately.   I am also concerned about your increased liver enzymes. Please continue to monitor and if you have any right upper quadrant pain, seek care immediatley.  Please return on Friday to follow up with me. You can make the appointment today. We will need to recheck your CBC and also discuss the new diagnosis of diabetes. You will need to be put on medication for diabetes but we will discuss this at your follow up visit in 2 days. In the meantime continue to drink lots of water.     Urinary Tract Infection, Adult A urinary tract infection (UTI) is an infection of any part of the urinary tract. The urinary tract includes the:  Kidneys.  Ureters.  Bladder.  Urethra. These organs make, store, and get rid of pee (urine) in the body. Follow these instructions at home:  Take over-the-counter and prescription medicines only as told by your doctor.  If you were prescribed an antibiotic medicine, take it as told by your doctor. Do not stop taking the antibiotic even if you start to feel better.  Avoid the following drinks:  Alcohol.  Caffeine.  Tea.  Carbonated drinks.  Drink enough fluid to keep your pee clear or pale yellow.  Keep all follow-up visits as told by your doctor. This is important.  Make sure to:  Empty your bladder often and completely. Do not to hold pee for long periods of time.  Empty your bladder before and after sex.  Wipe from front to back after a bowel movement if you are female. Use each tissue one time when you wipe. Contact a doctor if:  You have back pain.  You have a fever.  You feel sick to your  stomach (nauseous).  You throw up (vomit).  Your symptoms do not get better after 3 days.  Your symptoms go away and then come back. Get help right away if:  You have very bad back pain.  You have very bad lower belly (abdominal) pain.  You are throwing up and cannot keep down any medicines or water. This information is not intended to replace advice given to you by your health care provider. Make sure you discuss any questions you have with your health care provider. Document Released: 02/14/2008 Document Revised: 02/03/2016 Document Reviewed: 07/19/2015 Elsevier Interactive Patient Education  2017 [employer]   IF you received an x-ray today, you will receive an invoice from [facility]. Please contact [facility] at [phone] with questions or concerns regarding your invoice.   IF you received labwork today, you will receive an invoice from [facility]. Please contact [facility] at [phone] with questions or concerns regarding your invoice.   Our billing staff will not be able to assist you with questions regarding bills from these companies.  You will be contacted with the lab results as soon as they are available. The fastest way to get your results is to activate your My Chart account. Instructions are located on the last page of this paperwork. If you have not heard from us regarding the results in 2 weeks, please contact this office.

## 2016-11-09 ENCOUNTER — Other Ambulatory Visit: Payer: Self-pay | Admitting: Physician Assistant

## 2016-11-09 DIAGNOSIS — R748 Abnormal levels of other serum enzymes: Secondary | ICD-10-CM

## 2016-11-09 LAB — CMP14+EGFR
A/G RATIO: 1.6 (ref 1.2–2.2)
ALT: 169 IU/L — ABNORMAL HIGH (ref 0–32)
AST: 53 IU/L — AB (ref 0–40)
Albumin: 3.9 g/dL (ref 3.5–5.5)
Alkaline Phosphatase: 169 IU/L — ABNORMAL HIGH (ref 39–117)
BILIRUBIN TOTAL: 0.8 mg/dL (ref 0.0–1.2)
BUN/Creatinine Ratio: 21 (ref 9–23)
BUN: 15 mg/dL (ref 6–24)
CALCIUM: 8.8 mg/dL (ref 8.7–10.2)
CHLORIDE: 97 mmol/L (ref 96–106)
CO2: 20 mmol/L (ref 18–29)
Creatinine, Ser: 0.7 mg/dL (ref 0.57–1.00)
GFR, EST AFRICAN AMERICAN: 112 mL/min/{1.73_m2} (ref >59)
GFR, EST NON AFRICAN AMERICAN: 97 mL/min/{1.73_m2} (ref >59)
GLOBULIN, TOTAL: 2.5 g/dL (ref 1.5–4.5)
Glucose: 154 mg/dL — ABNORMAL HIGH (ref 65–99)
POTASSIUM: 3.6 mmol/L (ref 3.5–5.2)
SODIUM: 137 mmol/L (ref 134–144)
Total Protein: 6.4 g/dL (ref 6.0–8.5)

## 2016-11-09 LAB — URINE CULTURE

## 2016-11-10 ENCOUNTER — Ambulatory Visit (INDEPENDENT_AMBULATORY_CARE_PROVIDER_SITE_OTHER): Payer: PRIVATE HEALTH INSURANCE | Admitting: Physician Assistant

## 2016-11-10 VITALS — BP 152/80 | HR 91 | Temp 98.0°F | Resp 16 | Ht 64.0 in | Wt 196.8 lb

## 2016-11-10 DIAGNOSIS — E119 Type 2 diabetes mellitus without complications: Secondary | ICD-10-CM | POA: Diagnosis not present

## 2016-11-10 DIAGNOSIS — N390 Urinary tract infection, site not specified: Secondary | ICD-10-CM | POA: Diagnosis not present

## 2016-11-10 DIAGNOSIS — R030 Elevated blood-pressure reading, without diagnosis of hypertension: Secondary | ICD-10-CM

## 2016-11-10 LAB — POCT CBC
GRANULOCYTE PERCENT: 53 % (ref 37–80)
HEMATOCRIT: 40.6 % (ref 37.7–47.9)
HEMOGLOBIN: 14.1 g/dL (ref 12.2–16.2)
LYMPH, POC: 3.4 (ref 0.6–3.4)
MCH, POC: 30.8 pg (ref 27–31.2)
MCHC: 34.8 g/dL (ref 31.8–35.4)
MCV: 88.5 fL (ref 80–97)
MID (cbc): 0.9 (ref 0–0.9)
MPV: 7.9 fL (ref 0–99.8)
POC GRANULOCYTE: 4.8 (ref 2–6.9)
POC LYMPH %: 37.5 % (ref 10–50)
POC MID %: 9.5 % (ref 0–12)
Platelet Count, POC: 235 10*3/uL (ref 142–424)
RBC: 4.59 M/uL (ref 4.04–5.48)
RDW, POC: 12.3 %
WBC: 9.1 10*3/uL (ref 4.6–10.2)

## 2016-11-10 LAB — POCT URINALYSIS DIP (MANUAL ENTRY)
Bilirubin, UA: NEGATIVE
Glucose, UA: NEGATIVE
Ketones, POC UA: NEGATIVE
Leukocytes, UA: NEGATIVE
NITRITE UA: NEGATIVE
PH UA: 6
Protein Ur, POC: NEGATIVE
RBC UA: NEGATIVE
Spec Grav, UA: 1.01
UROBILINOGEN UA: 2

## 2016-11-10 LAB — POC MICROSCOPIC URINALYSIS (UMFC): MUCUS RE: ABSENT

## 2016-11-10 MED ORDER — METFORMIN HCL 500 MG PO TABS: ORAL_TABLET | ORAL | 3 refills | Status: DC

## 2016-11-10 MED ORDER — CEPHALEXIN 500 MG PO CAPS: 500.0000 mg | ORAL_CAPSULE | Freq: Two times a day (BID) | ORAL | 0 refills | Status: AC

## 2016-11-10 NOTE — Patient Instructions (Addendum)
For UTI, take keflex 500mg  twice daily for 7 days. This is a drug that is related to penicillins but considering you had it in the past you should do fine. If you develop any rash, shortness of breath, or difficulty breathing/swallowing while on the medication, please stop it and call our office immediately or go to th ED.  If you develop any nausea, vomiting, low back pain, or decreased urine production return to our clinic as well.  For diabetes, start metformin as prescribed. Really work on decreasing your sugar and bread consumption. You will be contacted by diabetes nutrition and they will talk with you about diet. Return in 3 months for follow up with me. We will recheck your A1C at this time.       Carbohydrate Counting for Diabetes Mellitus, Adult Carbohydrate counting is a method for keeping track of how many carbohydrates you eat. Eating carbohydrates naturally increases the amount of sugar (glucose) in the blood. Counting how many carbohydrates you eat helps keep your blood glucose within normal limits, which helps you manage your diabetes (diabetes mellitus). It is important to know how many carbohydrates you can safely have in each meal. This is different for every person. A diet and nutrition specialist (registered dietitian) can help you make a meal plan and calculate how many carbohydrates you should have at each meal and snack. Carbohydrates are found in the following foods:  Grains, such as breads and cereals.  Dried beans and soy products.  Starchy vegetables, such as potatoes, peas, and corn.  Fruit and fruit juices.  Milk and yogurt.  Sweets and snack foods, such as cake, cookies, candy, chips, and soft drinks. How do I count carbohydrates? There are two ways to count carbohydrates in food. You can use either of the methods or a combination of both. Reading "Nutrition Facts" on packaged food  The "Nutrition Facts" list is included on the labels of almost all packaged  foods and beverages in the [place] It includes:  The serving size.  Information about nutrients in each serving, including the grams (g) of carbohydrate per serving. To use the "Nutrition Facts":  Decide how many servings you will have.  Multiply the number of servings by the number of carbohydrates per serving.  The resulting number is the total amount of carbohydrates that you will be having. Learning standard serving sizes of other foods  When you eat foods containing carbohydrates that are not packaged or do not include "Nutrition Facts" on the label, you need to measure the servings in order to count the amount of carbohydrates:  Measure the foods that you will eat with a food scale or measuring cup, if needed.  Decide how many standard-size servings you will eat.  Multiply the number of servings by 15. Most carbohydrate-rich foods have about 15 g of carbohydrates per serving.  For example, if you eat 8 oz (170 g) of strawberries, you will have eaten 2 servings and 30 g of carbohydrates (2 servings x 15 g = 30 g).  For foods that have more than one food mixed, such as soups and casseroles, you must count the carbohydrates in each food that is included. The following list contains standard serving sizes of common carbohydrate-rich foods. Each of these servings has about 15 g of carbohydrates:   hamburger bun or  English muffin.   oz (15 mL) syrup.   oz (14 g) jelly.  1 slice of bread.  1 six-inch tortilla.  3 oz (85 g)  cooked rice or pasta.  4 oz (113 g) cooked dried beans.  4 oz (113 g) starchy vegetable, such as peas, corn, or potatoes.  4 oz (113 g) hot cereal.  4 oz (113 g) mashed potatoes or  of a large baked potato.  4 oz (113 g) canned or frozen fruit.  4 oz (120 mL) fruit juice.  4-6 crackers.  6 chicken nuggets.  6 oz (170 g) unsweetened dry cereal.  6 oz (170 g) plain fat-free yogurt or yogurt sweetened with artificial sweeteners.  8 oz (240  mL) milk.  8 oz (170 g) fresh fruit or one small piece of fruit.  24 oz (680 g) popped popcorn. Example of carbohydrate counting Sample meal   3 oz (85 g) chicken breast.  6 oz (170 g) brown rice.  4 oz (113 g) corn.  8 oz (240 mL) milk.  8 oz (170 g) strawberries with sugar-free whipped topping. Carbohydrate calculation  1. Identify the foods that contain carbohydrates:  Rice.  Corn.  Milk.  Strawberries. 2. Calculate how many servings you have of each food:  2 servings rice.  1 serving corn.  1 serving milk.  1 serving strawberries. 3. Multiply each number of servings by 15 g:  2 servings rice x 15 g = 30 g.  1 serving corn x 15 g = 15 g.  1 serving milk x 15 g = 15 g.  1 serving strawberries x 15 g = 15 g. 4. Add together all of the amounts to find the total grams of carbohydrates eaten:  30 g + 15 g + 15 g + 15 g = 75 g of carbohydrates total. This information is not intended to replace advice given to you by your health care provider. Make sure you discuss any questions you have with your health care provider. Document Released: 08/28/2005 Document Revised: 03/17/2016 Document Reviewed: 02/09/2016 Elsevier Interactive Patient Education  2017 [employer].   IF you received an x-ray today, you will receive an invoice from [facility]. Please contact [facility] at [phone] with questions or concerns regarding your invoice.   IF you received labwork today, you will receive an invoice from [facility]. Please contact [facility] at [phone] with questions or concerns regarding your invoice.   Our billing staff will not be able to assist you with questions regarding bills from these companies.  You will be contacted with the lab results as soon as they are available. The fastest way to get your results is to activate your My Chart account. Instructions are located on the last page of this paperwork. If you have not heard from [place]  regarding the results in 2 weeks, please contact this office.

## 2016-11-10 NOTE — Progress Notes (Signed)
MRN: [id number]  Subjective:   Robin Chen is a 56 y.o. female who presents for follow up on UTI and Type 2 diabetes mellitus. Was seen on 11/08/16 with nausea and vomiting. Had been diagnosed with UTI and placed on macrobid 7 days prior. Went to the ED on 11/06/16 and was found to have a WBC of 15, CT of abdomen showed liver steatosis. Was given IVF and zofran and discharged. On 11/08/16 visit in our office, her nausea and vomiting were resolving, she had just completed macrobid.  Her WBC was 19 and her urine micro showed WBC. She was started on cipro. Today, she states she only took one dose and had a rash so she quit taking it. Notes she is feeling much better. Denies dysuria, urinary frequency, urgency, nausea, vomitng, flank pain, fever, and abdominal pain.   She is also here to talk about T2DM. Diagnosis was made 11/08/16 after urine showed glucose an A1C was ran and showed a value of 7.8. Pt has bought a glucometer since our last visit and has checked her sugars daily. Notes it has been in the 120s.   Diet: She likes eating carbs and sweets frequently. Eats more red meat than vegetebles. She drinks, soda water, and coffee.   Exercise includes none. Denies smoking or alcohol use.   Known diabetic complications: None   Objective:   PHYSICAL EXAM BP (!) 152/80   Pulse 91   Temp 98 F (36.7 C) (Oral)   Resp 16   Ht 5\' 4"  (1.626 m)   Wt 196 lb 12.8 oz (89.3 kg)   SpO2 97%   BMI 33.78 kg/m   Physical Exam  Constitutional: She is oriented to person, place, and time. She appears well-developed and well-nourished. No distress.  HENT:  Head: Normocephalic and atraumatic.  Eyes: Conjunctivae are normal.  Neck: Normal range of motion.  Cardiovascular: Normal rate, regular rhythm and normal heart sounds.   Pulmonary/Chest: Effort normal.  Abdominal: There is no tenderness.  Neurological: She is alert and oriented to person, place, and time.  Skin: Skin is warm and dry.  Psychiatric:  She has a normal mood and affect.  Vitals reviewed.   BP Readings from Last 3 Encounters:  11/10/16 (!) 152/80  11/08/16 120/72  11/07/16 107/60    Diabetic Foot Exam - Simple   Simple Foot Form Diabetic Foot exam was performed with the following findings:  Yes 11/10/2016  6:06 PM  Visual Inspection No deformities, no ulcerations, no other skin breakdown bilaterally:  Yes Sensation Testing Intact to touch and monofilament testing bilaterally:  Yes Pulse Check Comments Could not feel pedal pulses on foot exam.     Results for orders placed or performed in visit on 11/10/16 (from the past 24 hour(s))  POCT CBC     Status: None   Collection Time: 11/10/16  6:07 PM  Result Value Ref Range   WBC 9.1 4.6 - 10.2 K/uL   Lymph, poc 3.4 0.6 - 3.4   POC LYMPH PERCENT 37.5 10 - 50 %L   MID (cbc) 0.9 0 - 0.9   POC MID % 9.5 0 - 12 %M   POC Granulocyte 4.8 2 - 6.9   Granulocyte percent 53.0 37 - 80 %G   RBC 4.59 4.04 - 5.48 M/uL   Hemoglobin 14.1 12.2 - 16.2 g/dL   HCT, POC 78.2 95.6 - 47.9 %   MCV 88.5 80 - 97 fL   MCH, POC 30.8 27 - 31.2 pg  MCHC 34.8 31.8 - 35.4 g/dL   RDW, POC 40.912.3 %   Platelet Count, POC 235 142 - 424 K/uL   MPV 7.9 0 - 99.8 fL  POCT urinalysis dipstick     Status: None   Collection Time: 11/10/16  6:22 PM  Result Value Ref Range   Color, UA yellow yellow   Clarity, UA clear clear   Glucose, UA negative negative   Bilirubin, UA negative negative   Ketones, POC UA negative negative   Spec Grav, UA 1.010    Blood, UA negative negative   pH, UA 6.0    Protein Ur, POC negative negative   Urobilinogen, UA 2.0    Nitrite, UA Negative Negative   Leukocytes, UA Negative Negative  POCT Microscopic Urinalysis ([facility])     Status: Abnormal   Collection Time: 11/10/16  6:25 PM  Result Value Ref Range   WBC,UR,HPF,POC None None WBC/hpf   RBC,UR,HPF,POC None None RBC/hpf   Bacteria Few (A) None, Too numerous to count   Mucus Absent Absent   Epithelial Cells, UR  Per Microscopy Few (A) None, Too numerous to count cells/hpf    Assessment and Plan :  1. Type 2 diabetes mellitus without complication, without long-term current use of insulin (HCC) -Educated on diabetes. Discussed dietary changes that could be made to help control glucose levels. Also discussed taking daily aspirin. Will wait on statin initiation as patient's LFTs are elevated and is awaiting RUQ US. Will follow up in 3 months.  - HM Diabetes Foot Exam - Microalbumin, urine - Ambulatory referral to diabetic education - metFORMIN (GLUCOPHAGE) 500 MG tablet; Week 1:500mg  in am w/ food. Week 2:500mg  in am and pm w/ food. Week 3: 1000mg  am, 500mg  pm w/ food. Week 4: 1000mg  in am and pm w/ food.  Dispense: 180 tablet; Refill: 3  2. Elevated BP without diagnosis of hypertension -Asymptomatic. Pt is typically well controlled in according to previous recordings in office. I have instructed pt to continue monitoring this outside of office and bring recordings to next visit. She will need to be initiated on an ACE for kidney protection at next visit, depending on outside readings will depend on what dose is initiated.   3. Urinary tract infection without hematuria, site unspecified -Appears that pt responded extremely well to one dose of cipro. However concern is that it will recur since she has stopped the antibiotic after one dose. Although pt has a penicillin allergy, according to medication review, pt has received ceftrixone successfully in the past. Pt states she remembers having the shot and did not have any reaction. We will therefore use kelfex in hopes to prevent recurrence of UTI. Pt given strict instructions that if she develops a rash, difficulty breathing, or swelling to stop the medication immediately and contact our office. We cannot use bactrim at this time due to patient's significantly elevated LFTs. Pt understands and agrees to tx plan.  - POCT CBC - POCT Microscopic Urinalysis ([facility]) -  POCT urinalysis dipstick - cephALEXin (KEFLEX) 500 MG capsule; Take 1 capsule (500 mg total) by mouth 2 (two) times daily.  Dispense: 14 capsule; Refill: 0  Benjiman CoreBrittany Rucha Wissinger, PA-C  [facility] 11/11/2016 8:29 AM

## 2016-11-11 LAB — CULTURE, BLOOD (ROUTINE X 2)
Culture: NO GROWTH
Culture: NO GROWTH

## 2016-11-11 LAB — MICROALBUMIN, URINE: Microalbumin, Urine: 39.6 ug/mL

## 2016-11-15 NOTE — Addendum Note (Signed)
Addended by: Baldwin CrownJOHNSON, SHAQUETTA D on: 11/15/2016 06:30 PM   Modules accepted: Orders

## 2016-11-17 ENCOUNTER — Other Ambulatory Visit: Payer: Self-pay | Admitting: Physician Assistant

## 2016-11-17 MED ORDER — LISINOPRIL 5 MG PO TABS: 2.5000 mg | ORAL_TABLET | Freq: Every day | ORAL | 1 refills | Status: DC

## 2016-11-17 NOTE — Progress Notes (Signed)
Meds ordered this encounter  Medications  . lisinopril (PRINIVIL,ZESTRIL) 5 MG tablet    Sig: Take 0.5 tablets (2.5 mg total) by mouth daily.    Dispense:  90 tablet    Refill:  1    Order Specific Question:   Supervising Provider    Answer:   Ethelda ChickSMITH, KRISTI M [[id number]]

## 2016-12-08 LAB — MICROALBUMIN / CREATININE URINE RATIO
CREATININE, UR: 68.7 mg/dL
MICROALBUM., U, RANDOM: 35.7 ug/mL
Microalb/Creat Ratio: 52 mg/g creat — ABNORMAL HIGH (ref 0.0–30.0)

## 2016-12-08 LAB — SPECIMEN STATUS REPORT

## 2017-02-14 ENCOUNTER — Ambulatory Visit (INDEPENDENT_AMBULATORY_CARE_PROVIDER_SITE_OTHER): Payer: PRIVATE HEALTH INSURANCE | Admitting: Physician Assistant

## 2017-02-14 ENCOUNTER — Encounter: Payer: Self-pay | Admitting: Physician Assistant

## 2017-02-14 VITALS — BP 122/78 | HR 106 | Temp 97.9°F | Resp 18 | Ht 64.0 in | Wt 192.4 lb

## 2017-02-14 DIAGNOSIS — Z23 Encounter for immunization: Secondary | ICD-10-CM

## 2017-02-14 DIAGNOSIS — R7989 Other specified abnormal findings of blood chemistry: Secondary | ICD-10-CM

## 2017-02-14 DIAGNOSIS — E119 Type 2 diabetes mellitus without complications: Secondary | ICD-10-CM

## 2017-02-14 DIAGNOSIS — Z1211 Encounter for screening for malignant neoplasm of colon: Secondary | ICD-10-CM | POA: Diagnosis not present

## 2017-02-14 DIAGNOSIS — R945 Abnormal results of liver function studies: Secondary | ICD-10-CM

## 2017-02-14 LAB — POCT GLYCOSYLATED HEMOGLOBIN (HGB A1C): HEMOGLOBIN A1C: 6.3

## 2017-02-14 NOTE — Patient Instructions (Addendum)
For diabetes, continue taking metformin as you are and continue limiting your sweets.   For elevated liver enzymes, please go and have an US. They will contact you in the next week with this appointment.  For colonoscopy and eye doctor, I have placed this referral and you should hear from them in 1-2 week.   Follow up in 3 months.   Thank you for letting me participate in your health and well being.    IF you received an x-ray today, you will receive an invoice from [facility]. Please contact [facility] at [phone] with questions or concerns regarding your invoice.   IF you received labwork today, you will receive an invoice from [facility]. Please contact [facility] at [phone] with questions or concerns regarding your invoice.   Our billing staff will not be able to assist you with questions regarding bills from these companies.  You will be contacted with the lab results as soon as they are available. The fastest way to get your results is to activate your My Chart account. Instructions are located on the last page of this paperwork. If you have not heard from us regarding the results in 2 weeks, please contact this office.

## 2017-02-14 NOTE — Progress Notes (Signed)
MRN: [id number]  Subjective:   Robin Chen is a 56 y.o. female who presents for follow up of Type 2 diabetes mellitus. Pt is not fasting today.   Diagnosis was made 11/08/16. Patient is currently taking metformin 562m  daily and sometimes twice daily.  Denies adverse effects including metallic taste, hypoglycemia, nausea, vomiting.   Patient is checking home blood sugars. Home blood sugar records: BGs range between 100 and 170. Current symptoms include none. Patient denies foot ulcerations, increased appetite, nausea, paresthesia of the feet, polydipsia, polyuria, visual disturbances, vomiting and weight loss. Patient is checking their feet daily. No foot concerns. Last diabetic eye exam eye exam: never.   Diet includes soup, oatmeal, wheat bread, chicken, and veggies. Will eat sweets occasionally. Drinks water only. . Exercise includes walking. Smokes 1 ppd x 35 years.   Known diabetic complications: none  Immunizations: pneumococal vaccine: never  Other concerns: LFTs were elevated at last visit, I ordered RUQ [place] she did not have it completed because she states she was never contacted with the appointment. She does have intermittent RUQ pain for the past few months. Denies nausea, vomiting, fever, and chills. Denies tylenol use. No alcohol use. No prior abdominal surgeries.    Objective:   PHYSICAL EXAM BP 122/78   Pulse (!) 106   Temp 97.9 F (36.6 C) (Oral)   Resp 18   Ht 5' 4"  (1.626 m)   Wt 192 lb 6.4 oz (87.3 kg)   SpO2 97%   BMI 33.03 kg/m   Physical Exam  Constitutional: She is oriented to person, place, and time. She appears well-developed and well-nourished.  HENT:  Head: Normocephalic and atraumatic.  Eyes: Conjunctivae are normal.  Neck: Normal range of motion.  Cardiovascular: Normal rate, regular rhythm and normal heart sounds.   Pulmonary/Chest: Effort normal and breath sounds normal.  Abdominal: Soft. Bowel sounds are normal. There is no tenderness.  There is no rigidity, no guarding and negative Murphy's sign.  Neurological: She is alert and oriented to person, place, and time.  Skin: Skin is warm and dry.  Psychiatric: She has a normal mood and affect.  Vitals reviewed.    Results for orders placed or performed in visit on 02/14/17 (from the past 24 hour(s))  POCT glycosylated hemoglobin (Hb A1C)     Status: None   Collection Time: 02/14/17  6:01 PM  Result Value Ref Range   Hemoglobin A1C 6.3    Wt Readings from Last 3 Encounters:  02/14/17 192 lb 6.4 oz (87.3 kg)  11/10/16 196 lb 12.8 oz (89.3 kg)  11/08/16 196 lb 3.2 oz (89 kg)    Assessment and Plan :  1. Type 2 diabetes mellitus without complication, without long-term current use of insulin (HCC) Well controlled in office today. Instructed to continue taking metformin as she is now. Continue dietary modifications. She does not need refills at this time. Plan for follow up in 3 months. Informed her to fast for this visit so we can obtain a lipid panel. Will plan to initiate a statin once elevated LFTs have been further evaluated and lipid panel has been obtained.  - POCT glycosylated hemoglobin (Hb A1C) - CMP14+EGFR - Ambulatory referral to Ophthalmology - Pneumococcal polysaccharide vaccine 23-valent greater than or equal to 57yo subcutaneous/IM  2. Elevated LFTs - Hepatitis panel, acute - UKoreaAbdomen Limited RUQ; Future  3. Need for Td vaccine - Td vaccine greater than or equal to 7yo preservative free IM  4. Screen  for colon cancer - Ambulatory referral to Gastroenterology  Tenna Delaine, PA-C  [facility] 02/15/2017 7:33 PM

## 2017-02-15 LAB — CMP14+EGFR
A/G RATIO: 2 (ref 1.2–2.2)
ALBUMIN: 4.8 g/dL (ref 3.5–5.5)
ALK PHOS: 106 IU/L (ref 39–117)
ALT: 36 IU/L — ABNORMAL HIGH (ref 0–32)
AST: 33 IU/L (ref 0–40)
BUN / CREAT RATIO: 25 — AB (ref 9–23)
BUN: 15 mg/dL (ref 6–24)
Bilirubin Total: 0.2 mg/dL (ref 0.0–1.2)
CO2: 24 mmol/L (ref 18–29)
CREATININE: 0.59 mg/dL (ref 0.57–1.00)
Calcium: 9.4 mg/dL (ref 8.7–10.2)
Chloride: 102 mmol/L (ref 96–106)
GFR calc Af Amer: 118 mL/min/{1.73_m2} (ref >59)
GFR calc non Af Amer: 103 mL/min/{1.73_m2} (ref >59)
GLOBULIN, TOTAL: 2.4 g/dL (ref 1.5–4.5)
Glucose: 103 mg/dL — ABNORMAL HIGH (ref 65–99)
POTASSIUM: 4.1 mmol/L (ref 3.5–5.2)
SODIUM: 143 mmol/L (ref 134–144)
Total Protein: 7.2 g/dL (ref 6.0–8.5)

## 2017-02-15 LAB — HEPATITIS PANEL, ACUTE
HEP A IGM: NEGATIVE
HEP B C IGM: NEGATIVE
HEP B S AG: NEGATIVE
Hep C Virus Ab: 0.1 s/co ratio (ref 0.0–0.9)

## 2017-02-17 ENCOUNTER — Telehealth: Payer: Self-pay | Admitting: Emergency Medicine

## 2017-02-17 NOTE — Telephone Encounter (Signed)
-----   Message from Magdalene RiverBrittany D Wiseman, PA-C sent at 02/17/2017  2:13 PM EDT ----- Please call patient and report her electrolytes, blood sugar, and kidney function were all clinically normal. Her liver enzymes have significantly improved since her last visit however I still think she should proceed with the RUQ US as planned. Her hepatitis panel was negative. Thanks!

## 2017-02-20 ENCOUNTER — Telehealth: Payer: Self-pay | Admitting: Emergency Medicine

## 2017-02-20 NOTE — Telephone Encounter (Signed)
Spoke with patient son to give scheduled US appointment Per son request to leave vm with date/time of [place]

## 2017-02-22 ENCOUNTER — Ambulatory Visit (HOSPITAL_COMMUNITY): Payer: No Typology Code available for payment source

## 2017-02-26 ENCOUNTER — Ambulatory Visit (HOSPITAL_COMMUNITY)
Admission: RE | Admit: 2017-02-26 | Discharge: 2017-02-26 | Disposition: A | Discharge status: Home / Self Care | Payer: No Typology Code available for payment source | Source: Ambulatory Visit | Attending: Physician Assistant | Admitting: Physician Assistant

## 2017-02-26 DIAGNOSIS — K760 Fatty (change of) liver, not elsewhere classified: Secondary | ICD-10-CM | POA: Diagnosis not present

## 2017-02-26 DIAGNOSIS — R7989 Other specified abnormal findings of blood chemistry: Secondary | ICD-10-CM | POA: Insufficient documentation

## 2017-02-26 DIAGNOSIS — R945 Abnormal results of liver function studies: Secondary | ICD-10-CM

## 2017-02-27 ENCOUNTER — Encounter: Payer: Self-pay | Admitting: Physician Assistant

## 2017-02-27 DIAGNOSIS — K760 Fatty (change of) liver, not elsewhere classified: Secondary | ICD-10-CM | POA: Insufficient documentation

## 2017-03-06 ENCOUNTER — Encounter: Payer: Self-pay | Admitting: Emergency Medicine

## 2017-03-15 LAB — HM DIABETES EYE EXAM

## 2017-03-26 ENCOUNTER — Encounter: Payer: Self-pay | Admitting: Physician Assistant

## 2017-04-26 ENCOUNTER — Telehealth: Payer: Self-pay | Admitting: Family Medicine

## 2017-04-26 NOTE — Telephone Encounter (Signed)
lmom for pt to reschedule an OV with Barnett AbuWiseman that was on 05-16-17 she will be out of the office that day

## 2017-05-15 ENCOUNTER — Ambulatory Visit (INDEPENDENT_AMBULATORY_CARE_PROVIDER_SITE_OTHER): Payer: PRIVATE HEALTH INSURANCE | Admitting: Physician Assistant

## 2017-05-15 ENCOUNTER — Encounter: Payer: Self-pay | Admitting: Physician Assistant

## 2017-05-15 ENCOUNTER — Ambulatory Visit: Payer: PRIVATE HEALTH INSURANCE | Admitting: Physician Assistant

## 2017-05-15 VITALS — BP 132/87 | HR 107 | Temp 98.3°F | Resp 16 | Ht 64.0 in | Wt 190.6 lb

## 2017-05-15 DIAGNOSIS — E119 Type 2 diabetes mellitus without complications: Secondary | ICD-10-CM | POA: Insufficient documentation

## 2017-05-15 DIAGNOSIS — R748 Abnormal levels of other serum enzymes: Secondary | ICD-10-CM

## 2017-05-15 MED ORDER — METFORMIN HCL 500 MG PO TABS: 500.0000 mg | ORAL_TABLET | Freq: Two times a day (BID) | ORAL | 3 refills | Status: DC

## 2017-05-15 NOTE — Patient Instructions (Addendum)
Come back in 3 months for recheck with Grenada[place]ittany.     IF you received an x-ray today, you will receive an invoice from [facility]. Please contact [facility] at [phone] with questions or concerns regarding your invoice.   IF you received labwork today, you will receive an invoice from [facility]. Please contact [facility] at [phone] with questions or concerns regarding your invoice.   Our billing staff will not be able to assist you with questions regarding bills from these companies.  You will be contacted with the lab results as soon as they are available. The fastest way to get your results is to activate your My Chart account. Instructions are located on the last page of this paperwork. If you have not heard from us regarding the results in 2 weeks, please contact this office.

## 2017-05-15 NOTE — Progress Notes (Signed)
Robin Chen  MRN: [id number] DOB: 09-23-1960  PCP: Leonie Douglas, PA-C  Subjective:  Pt is a pleasant 56 year old female PMH fatty liver, TII DM, renal insufficiency, thrombocytopenia who to clinic for f/u labs.   DM type 2 - Dx 11/08/16. Last OV 02/14/2017. Controlled with metformin 568m qd. Denies side effects. She needs refill of medication.  Last A1C 3 months ago was 6.3%. Checks home blood sugars, which are in the mid - to- low 100's. She has made several lifestyle modifications including walking every night and improving her diet. She recently went to her DM eye exam - negative.   Recent increased LFT's. RUQ [place] at her last OV on 6/6. [place]ompleted on 6/18 and was negative. Negative hepatitis panel also on 6/6. She is feeling great today. Denies abdominal pain, loose stools, nausea, jaundice.   Review of Systems  Constitutional: Negative for chills, diaphoresis, fatigue, fever and unexpected weight change.  Respiratory: Negative for cough, chest tightness, shortness of breath and wheezing.   Cardiovascular: Negative for chest pain and palpitations.  Gastrointestinal: Negative for abdominal pain, diarrhea, nausea and vomiting.  Neurological: Negative for weakness, light-headedness and headaches.    Patient Active Problem List   Diagnosis Date Noted  . Fatty infiltration of liver 02/27/2017  . Urosepsis 01/09/2012  . Renal stone 01/09/2012  . Thrombocytopenia ([place] 01/09/2012  . Acute renal insufficiency 01/09/2012    Current Outpatient Prescriptions on File Prior to Visit  Medication Sig Dispense Refill  . ibuprofen (ADVIL,MOTRIN) 200 MG tablet Take 200 mg by mouth every 6 (six) hours as needed for headache, mild pain or moderate pain.    .Marland Kitchenlisinopril (PRINIVIL,ZESTRIL) 5 MG tablet Take 0.5 tablets (2.5 mg total) by mouth daily. 90 tablet 1  . metFORMIN (GLUCOPHAGE) 500 MG tablet Week 1:5027min am w/ food. Week 2:50065mn am and pm w/ food. Week 3: 1000m26m,  500mg39mw/ food. Week 4: 1000mg 39mm and pm w/ food. 180 tablet 3   No current facility-administered medications on file prior to visit.     Allergies  Allergen Reactions  . Penicillins Rash and Other (See Comments)    Has patient had a PCN reaction causing immediate rash, facial/tongue/throat swelling, SOB or lightheadedness with hypotension: No Has patient had a PCN reaction causing severe rash involving mucus membranes or skin necrosis: No Has patient had a PCN reaction that required hospitalization No Has patient had a PCN reaction occurring within the last 10 years: No If all of the above answers are "NO", then may proceed with Cephalosporin use.  . Ciprofloxacin Rash     Objective:  BP 132/87   Pulse (!) 107   Temp 98.3 F (36.8 C) (Oral)   Resp 16   Ht 5' 4"  (1.626 m)   Wt 190 lb 9.6 oz (86.5 kg)   SpO2 98%   BMI 32.72 kg/m   Diabetic Foot Exam - Simple   Simple Foot Form Diabetic Foot exam was performed with the following findings:  Yes 05/15/2017  5:00 PM  Visual Inspection No deformities, no ulcerations, no other skin breakdown bilaterally:  Yes Sensation Testing Intact to touch and monofilament testing bilaterally:  Yes Pulse Check Posterior Tibialis and Dorsalis pulse intact bilaterally:  Yes Comments      Physical Exam  Constitutional: She is oriented to person, place, and time and well-developed, well-nourished, and in no distress. No distress.  Cardiovascular: Normal rate, regular rhythm and normal heart sounds.  Pulmonary/Chest: Effort normal. No respiratory distress.  Neurological: She is alert and oriented to person, place, and time. GCS score is 15.  Skin: Skin is warm and dry.  Psychiatric: Mood, memory, affect and judgment normal.  Vitals reviewed.  Lab Results  Component Value Date   HGBA1C 6.3 02/14/2017   Lab Results  Component Value Date   ALT 36 (H) 02/14/2017   AST 33 02/14/2017   ALKPHOS 106 02/14/2017   BILITOT 0.2 02/14/2017      RUQ [place] 02/26/2017 IMPRESSION: No gallstones or sonographic evidence of acute cholecystitis. If there are clinical concerns of chronic cholecystitis, a nuclear medicine hepatobiliary scan with gallbladder ejection fraction determination may be useful. Assessment and Plan :  1. Type 2 diabetes mellitus without complication, without long-term current use of insulin (HCC) - HM Diabetes Foot Exam - Hemoglobin A1c - metFORMIN (GLUCOPHAGE) 500 MG tablet; Take 1 tablet (500 mg total) by mouth 2 (two) times daily with a meal.  Dispense: 60 tablet; Refill: 3 - DM eye exam completed. DM foot exam is negative. A1C is pending.  Last A1C is <7. She con't to make lifestyle changes and is feeling great. Con't Metformin 579m bid and checking daily sugars. RTC in 3 months to f/u with PA [place]  2. Elevated liver enzymes - Lipid panel - CMP14+EGFR - As per PA Wiseman's last OV note, plan to start statin once elevated LFTs is wnl.    WMercer Pod PA-C  [facility]/12/2016 5:02 PM

## 2017-05-16 ENCOUNTER — Ambulatory Visit: Payer: PRIVATE HEALTH INSURANCE | Admitting: Physician Assistant

## 2017-05-16 LAB — CMP14+EGFR
ALT: 56 IU/L — ABNORMAL HIGH (ref 0–32)
AST: 47 IU/L — ABNORMAL HIGH (ref 0–40)
Albumin/Globulin Ratio: 1.9 (ref 1.2–2.2)
Albumin: 5 g/dL (ref 3.5–5.5)
Alkaline Phosphatase: 110 IU/L (ref 39–117)
BUN/Creatinine Ratio: 25 — ABNORMAL HIGH (ref 9–23)
BUN: 16 mg/dL (ref 6–24)
Bilirubin Total: 0.4 mg/dL (ref 0.0–1.2)
CO2: 24 mmol/L (ref 20–29)
Calcium: 10 mg/dL (ref 8.7–10.2)
Chloride: 99 mmol/L (ref 96–106)
Creatinine, Ser: 0.65 mg/dL (ref 0.57–1.00)
GFR calc Af Amer: 115 mL/min/{1.73_m2} (ref >59)
GFR calc non Af Amer: 100 mL/min/{1.73_m2} (ref >59)
Globulin, Total: 2.6 g/dL (ref 1.5–4.5)
Glucose: 96 mg/dL (ref 65–99)
Potassium: 4.2 mmol/L (ref 3.5–5.2)
Sodium: 140 mmol/L (ref 134–144)
Total Protein: 7.6 g/dL (ref 6.0–8.5)

## 2017-05-16 LAB — HEMOGLOBIN A1C
Est. average glucose Bld gHb Est-mCnc: 137 mg/dL
Hgb A1c MFr Bld: 6.4 % — ABNORMAL HIGH (ref 4.8–5.6)

## 2017-05-16 LAB — LIPID PANEL
Chol/HDL Ratio: 4.8 ratio — ABNORMAL HIGH (ref 0.0–4.4)
Cholesterol, Total: 247 mg/dL — ABNORMAL HIGH (ref 100–199)
HDL: 51 mg/dL (ref >39)
LDL Calculated: 139 mg/dL — ABNORMAL HIGH (ref 0–99)
Triglycerides: 286 mg/dL — ABNORMAL HIGH (ref 0–149)
VLDL Cholesterol Cal: 57 mg/dL — ABNORMAL HIGH (ref 5–40)

## 2017-05-22 NOTE — Progress Notes (Signed)
Please call pt and have her come in for a LAB ONLY visit to recheck her cholesterol levels. Please be sure to be FASTING for this visit.  Thank you!

## 2017-05-22 NOTE — Addendum Note (Signed)
Addended by: Sebastian AcheMCVEY, Montrez Marietta WHITNEY on: 05/22/2017 02:52 PM   Modules accepted: Orders

## 2017-07-09 ENCOUNTER — Other Ambulatory Visit: Payer: Self-pay | Admitting: Physician Assistant

## 2017-07-09 DIAGNOSIS — Z1231 Encounter for screening mammogram for malignant neoplasm of breast: Secondary | ICD-10-CM

## 2017-07-12 ENCOUNTER — Ambulatory Visit
Admission: RE | Admit: 2017-07-12 | Discharge: 2017-07-12 | Disposition: A | Discharge status: Home / Self Care | Payer: No Typology Code available for payment source | Source: Ambulatory Visit | Attending: Physician Assistant | Admitting: Physician Assistant

## 2017-07-12 DIAGNOSIS — Z1231 Encounter for screening mammogram for malignant neoplasm of breast: Secondary | ICD-10-CM

## 2017-08-09 ENCOUNTER — Encounter (HOSPITAL_COMMUNITY): Payer: Self-pay

## 2017-08-09 ENCOUNTER — Ambulatory Visit: Payer: Self-pay | Admitting: Registered Nurse

## 2017-08-09 ENCOUNTER — Emergency Department (HOSPITAL_COMMUNITY)
Admission: EM | Admit: 2017-08-09 | Discharge: 2017-08-10 | Disposition: A | Discharge status: Home / Self Care | Payer: No Typology Code available for payment source | Attending: Emergency Medicine | Admitting: Emergency Medicine

## 2017-08-09 VITALS — BP 129/85 | HR 83 | Temp 98.5°F

## 2017-08-09 DIAGNOSIS — Z7984 Long term (current) use of oral hypoglycemic drugs: Secondary | ICD-10-CM | POA: Diagnosis not present

## 2017-08-09 DIAGNOSIS — N189 Chronic kidney disease, unspecified: Secondary | ICD-10-CM | POA: Insufficient documentation

## 2017-08-09 DIAGNOSIS — J449 Chronic obstructive pulmonary disease, unspecified: Secondary | ICD-10-CM | POA: Insufficient documentation

## 2017-08-09 DIAGNOSIS — M533 Sacrococcygeal disorders, not elsewhere classified: Secondary | ICD-10-CM

## 2017-08-09 DIAGNOSIS — N3000 Acute cystitis without hematuria: Secondary | ICD-10-CM | POA: Diagnosis not present

## 2017-08-09 DIAGNOSIS — F17210 Nicotine dependence, cigarettes, uncomplicated: Secondary | ICD-10-CM | POA: Insufficient documentation

## 2017-08-09 DIAGNOSIS — I129 Hypertensive chronic kidney disease with stage 1 through stage 4 chronic kidney disease, or unspecified chronic kidney disease: Secondary | ICD-10-CM | POA: Insufficient documentation

## 2017-08-09 DIAGNOSIS — R1031 Right lower quadrant pain: Secondary | ICD-10-CM

## 2017-08-09 DIAGNOSIS — R109 Unspecified abdominal pain: Secondary | ICD-10-CM

## 2017-08-09 DIAGNOSIS — E1122 Type 2 diabetes mellitus with diabetic chronic kidney disease: Secondary | ICD-10-CM | POA: Insufficient documentation

## 2017-08-09 LAB — POCT URINALYSIS DIPSTICK
Bilirubin, UA: NEGATIVE
Blood, UA: NEGATIVE
Glucose, UA: NEGATIVE
Ketones, UA: NEGATIVE
Leukocytes, UA: NEGATIVE
Nitrite, UA: NEGATIVE
Protein, UA: NEGATIVE
Spec Grav, UA: 1.02
Urobilinogen, UA: 0.2 U/dL
pH, UA: 6.5

## 2017-08-09 MED ORDER — NAPROXEN SODIUM 220 MG PO TABS: 220.0000 mg | ORAL_TABLET | Freq: Two times a day (BID) | ORAL | Status: DC | PRN

## 2017-08-09 MED ORDER — CYCLOBENZAPRINE HCL 10 MG PO TABS: 5.0000 mg | ORAL_TABLET | Freq: Three times a day (TID) | ORAL | 0 refills | Status: DC | PRN

## 2017-08-09 MED ORDER — ACETAMINOPHEN 500 MG PO TABS: 1000.0000 mg | ORAL_TABLET | Freq: Four times a day (QID) | ORAL | 0 refills | Status: AC | PRN

## 2017-08-09 NOTE — Progress Notes (Signed)
Subjective:    Patient ID: Robin Chen, female    DOB: Jan 28, 1961, 56 y.o.   MRN: [id number]  56y/o caucasian married female established pt reports pain to R hip x2 days. States she had similar pain last week but only lasted one day. Pain in right hip and extending down upper lateral R leg. Worse when sitting still or in one position extended period. Pain improves when up and moving around/standing/walking.  Pain radiates around to right groin also. Intermittent tingles toes right foot.  Patient concerned this could be a repeat kidney stone, last episode a couple years ago was hospitalized.  Pain not exactly the same but similar.  Typically sleeps on her right side.  Denied falls/trauma to hip.  Menopause approximately 10 years per patient. Had heavy menses prior to menopause.  Denied abnormal pap, ovarian cysts, hematuria, dysuria, fever, chills, headache, anuria, cloudy or smelly urine, weakness, loss of bowel/bladder control. Has tried 1 tablet Aleve po BID at home with little relief.  Pt reports she has been out of her Lisinopril x1 week. Sees pcp on 12/5 and is planning to get refill for 3-6 months then. Last fill by pcp March 2018.Takes 2.5mg  daily po. Pt believes no refills available/Rx expired.  Pharmacy called by Johnna AcostaN Haley as Rx written should have allowed for #90 0.5 tab 6 month supply to be refilled in September. Pharm rep confirms refill available. RN Nance PewHaley Workman requested they go ahead and fill this and pt informed she can pick it up this afternoon and restart med. Patient notified by RN med refill requested at her [facility] pharmacy of choice.      Review of Systems  Constitutional: Negative for activity change, appetite change, chills, diaphoresis, fatigue and fever.  HENT: Negative for nosebleeds, rhinorrhea, trouble swallowing and voice change.   Eyes: Negative for photophobia and visual disturbance.  Respiratory: Negative for cough, chest tightness, shortness of breath, wheezing  and stridor.   Cardiovascular: Negative for chest pain, palpitations and leg swelling.  Gastrointestinal: Positive for abdominal pain. Negative for abdominal distention, anal bleeding, blood in stool, constipation, diarrhea, nausea, rectal pain and vomiting.  Endocrine: Negative for cold intolerance and heat intolerance.  Genitourinary: Positive for flank pain. Negative for decreased urine volume, difficulty urinating, dysuria, enuresis, frequency, genital sores, hematuria, menstrual problem, pelvic pain, vaginal bleeding, vaginal discharge and vaginal pain.  Musculoskeletal: Positive for arthralgias, back pain and myalgias. Negative for gait problem, joint swelling, neck pain and neck stiffness.  Skin: Negative for color change, pallor, rash and wound.  Allergic/Immunologic: Negative for food allergies.  Neurological: Positive for numbness. Negative for dizziness, tremors, seizures, syncope, facial asymmetry, speech difficulty, weakness, light-headedness and headaches.  Hematological: Negative for adenopathy. Does not bruise/bleed easily.  Psychiatric/Behavioral: Negative for agitation, confusion and sleep disturbance.       Objective:   Physical Exam  Constitutional: She is oriented to person, place, and time. Vital signs are normal. She appears well-developed and well-nourished. She is active and cooperative.  Non-toxic appearance. She does not have a sickly appearance. She does not appear ill. No distress.  HENT:  Head: Normocephalic and atraumatic.  Right Ear: Hearing and external ear normal.  Left Ear: Hearing and external ear normal.  Nose: Nose normal.  Mouth/Throat: Uvula is midline, oropharynx is clear and moist and mucous membranes are normal. No oropharyngeal exudate.  Eyes: Conjunctivae, EOM and lids are normal. Pupils are equal, round, and reactive to light. Right eye exhibits no discharge. Left eye exhibits no  discharge. No scleral icterus.  Neck: Trachea normal, normal range  of motion and phonation normal. Neck supple. No tracheal tenderness, no spinous process tenderness and no muscular tenderness present. No neck rigidity. No tracheal deviation, no edema, no erythema and normal range of motion present.  Cardiovascular: Normal rate, regular rhythm, normal heart sounds and intact distal pulses. Exam reveals no gallop and no friction rub.  No murmur heard. Pulses:      Radial pulses are 2+ on the right side, and 2+ on the left side.  Pulmonary/Chest: Effort normal and breath sounds normal. No accessory muscle usage or stridor. No respiratory distress. She has no decreased breath sounds. She has no wheezes. She has no rhonchi. She has no rales. She exhibits no tenderness.  Abdominal: Soft. Bowel sounds are normal. She exhibits no shifting dullness, no distension, no pulsatile liver, no fluid wave, no abdominal bruit, no ascites, no pulsatile midline mass and no mass. There is tenderness in the right lower quadrant and suprapubic area. There is CVA tenderness. There is no rigidity, no rebound, no guarding, no tenderness at McBurney's point and negative Murphy's sign. Hernia confirmed negative in the ventral area.    Dull to percussion x 4 quads; normoactive bowel sounds x 4 quads; mildly TTP suprapubic and RLQ and Right CVA tenderness to percussion  Musculoskeletal: Normal range of motion. She exhibits tenderness. She exhibits no edema or deformity.       Right shoulder: Normal.       Left shoulder: Normal.       Right elbow: Normal.      Left elbow: Normal.       Right hip: She exhibits tenderness. She exhibits normal range of motion, normal strength, no bony tenderness, no swelling, no crepitus, no deformity and no laceration.       Left hip: Normal.       Right knee: Normal.       Left knee: Normal.       Right ankle: Normal.       Left ankle: Normal.       Cervical back: Normal.       Thoracic back: Normal.       Lumbar back: She exhibits tenderness, bony  tenderness, pain and spasm.       Back:       Right hand: Normal.       Left hand: Normal.       Legs: Applied thermacare from clinic stock; mild right CVA tenderness; on/off exam table slowly rolling to right side then sitting up; able to touch toes; full AROM but pain with flexion lying to sitting and reverse; negative straight leg raise bilaterally; right leg raise pain right hip and no pain with left leg raise; TTP over right hip bursa and SI joint and patient reported deeper pain with AROM right hip; patient position of comfort flexed at waist 10 degrees when standing preferred not to sit in chair but stand in exam room when not on exam table; left SI joint not TTP  Lymphadenopathy:       Head (right side): No submental, no submandibular, no tonsillar, no preauricular, no posterior auricular and no occipital adenopathy present.       Head (left side): No submental, no submandibular, no tonsillar, no preauricular, no posterior auricular and no occipital adenopathy present.    She has no cervical adenopathy.       Right cervical: No superficial cervical, no deep cervical and no posterior cervical  adenopathy present.      Left cervical: No superficial cervical, no deep cervical and no posterior cervical adenopathy present.       Right: No inguinal adenopathy present.       Left: No inguinal adenopathy present.  Neurological: She is alert and oriented to person, place, and time. She has normal strength. She is not disoriented. She displays no atrophy, no tremor and normal reflexes. No cranial nerve deficit or sensory deficit. She exhibits normal muscle tone. She displays no seizure activity. Coordination and gait normal. GCS eye subscore is 4. GCS verbal subscore is 5. GCS motor subscore is 6.  Reflex Scores:      Patellar reflexes are 2+ on the right side and 2+ on the left side.      Achilles reflexes are 2+ on the right side and 2+ on the left side. Gait sure and steady in hallway; bilateral  hand grasp and upper/lower extremity strength 5/5  Skin: Skin is warm, dry and intact. No abrasion, no bruising, no burn, no ecchymosis, no laceration, no lesion, no petechiae and no rash noted. She is not diaphoretic. No cyanosis or erythema. No pallor. Nails show no clubbing.  Psychiatric: She has a normal mood and affect. Her speech is normal and behavior is normal. Judgment and thought content normal. Cognition and memory are normal.          Assessment & Plan:  A-RLQ abdomen pain; right SI joint dysfunction, hypertension  P-urinalysis when able to give sample; she had urinated prior to arrival at clinic.  Patient having to leave work early today to accompany spouse at his medical appt 1300.  Will trial tylenol 1000mg  po QID prn pain given 6 UD from clinic stock, hydrate to keep urine pale yellow tinged clear; may take aleve at home 1 tablet BID alternate with tylenol for pain.  Discussed tylenol typically does not raise blood pressure.  If vomiting, unable to urinate every 8 hours, hematuria, dysuria, cloudy/smelly urine, worsening abdomen/back pain follow up with UC/ER for re-evaluation.   Discussed this could be kidney infection, repeat kidney stone, radiating back pain, ovarian cyst.  Patient given exitcare handout on kidney stones, foods to eat to prevent kidney stones, radicular pain, hip bursitis.  Patient verbalized understanding information/instructions, agreed with plan of care and had no further questions at this time.  Trial cyclobenazeprine/flexeril 5-10mg  po TID prn muscle spasms #30 RF0 dispensed from [facility]. Tylenol 1000mg  po qid prn pain #6 UD given to patient from clinic stock.  Thermacare lumbar applied to patient right SI joint/hip in clinic. Given biofreeze 4 UD apply QID prn pain.  Avoid alcohol intake and driving while taking cyclobenazeprine/flexeril as drowsiness common side effect.  Slow position changes as medication also lower blood pressure.  Home stretches demonstrated  to patient-e.g. knee to chest and rock side to side on back. Self massage or professional prn, foam roller use or tennis/racquetball.  Heat/cryotherapy 15 minutes QID prn has reusable hot/cold pack at home.  Trial thermacare  Consider physical therapy referral if no improvement with prescribed therapy from [facility] and/or chiropractic care.  Ensure ergonomics correct desk at work avoid repetitive motions if possible/holding phone/laptop in hand use desk/stand and/or break up lifting items into smaller loads/weights.  Patient was instructed to rest, ice, and ROM exercises.  Activity as tolerated.   Follow up if symptoms persist or worsen especially if loss of bowel/bladder control, arm/leg weakness and/or saddle paresthesias see provider same day.  Discussed bursitis hip treatment  options with patient starting with conservative options per patient preference.  Patient verbalized agreement and understanding of treatment plan and had no further questions at this time.  P2:  Injury Prevention and Fitness.  Continue current medications as directed.  Pick up Rx refill at your pharmacy today and take dose today.  See RN Rolly Salter to recheck your blood pressure tomorrow.  Recommended weight loss/weight maintenance to BMI 20-25.  Return to the clinic if any new symptoms. Keep follow up appt with your PCM next week.  Patient verbalized agreement and understanding of treatment plan and had no further questions at this time.   P2:   Exercise

## 2017-08-09 NOTE — Patient Instructions (Signed)
Hip Bursitis Hip bursitis is swelling of a fluid-filled sac (bursa) in your hip. This swelling (inflammation) can be painful. This condition may come and go over time. Follow these instructions at home: Medicines  Take over-the-counter and prescription medicines only as told by your doctor.  Do not drive or use heavy machinery while taking prescription pain medicine, or as told by your doctor.  If you were prescribed an antibiotic medicine, take it as told by your doctor. Do not stop taking the antibiotic even if you start to feel better. Activity  Return to your normal activities as told by your doctor. Ask your doctor what activities are safe for you.  Rest and protect your hip until you feel better. General instructions  Wear wraps that put pressure on your hip (compression wraps) only as told by your doctor.  Raise (elevate) your hip above the level of your heart as much as you can. To do this, try putting a pillow under your hips while you lie down. Stop if this causes pain.  Do not use your hip to support your body weight until your doctor says that you can.  Use crutches as told by your doctor.  Gently rub and stretch your injured area as often as is comfortable.  Keep all follow-up visits as told by your doctor. This is important. How is this prevented?  Exercise regularly, as told by your doctor.  Warm up and stretch before being active.  Cool down and stretch after being active.  Avoid activities that bother your hip or cause pain.  Avoid sitting down for long periods at a time. Contact a doctor if:  You have a fever.  You get new symptoms.  You have trouble walking.  You have trouble doing everyday activities.  You have pain that gets worse.  You have pain that does not get better with medicine.  You get red skin on your hip area.  You get a feeling of warmth in your hip area. Get help right away if:  You cannot move your hip.  You have very bad  pain. This information is not intended to replace advice given to you by your health care provider. Make sure you discuss any questions you have with your health care provider. Document Released: 09/30/2010 Document Revised: 02/03/2016 Document Reviewed: 03/30/2015 Elsevier Interactive Patient Education  2018 [employer]. Radicular Pain Radicular pain is a type of pain that spreads from your back or neck along a spinal nerve. Spinal nerves are nerves that leave the spinal cord and go to the muscles. Radicular pain occurs when one of these nerves becomes irritated or squeezed (compressed). Radicular pain is sometimes called radiculopathy, radiculitis, or a pinched nerve. When you have this type of pain, you may also have weakness, numbness, or tingling in the area of your body that is supplied by the nerve. The pain may feel sharp and burning. Spinal nerves leave the spinal cord through openings between the 24 bones (vertebrae) that make up the spine. Radicular pain is often caused by something pushing on a spinal nerve. This pushing may be done by a vertebra or by one of the round cushions between vertebrae (intervertebral disks). This can result from an injury, from wear and tear or aging of a disk, or from the growth of a bone spur that pushes on the nerve. Radicular pain can occur in various areas depending on which spinal nerve is affected:  Cervical radicular pain occurs in the neck. You may also  feel pain, numbness, weakness, or tingling in the arms.  Thoracic radicular pain occurs in the mid-spine area. You would feel this pain in the back and chest. This type is rare.  Lumbar radicular pain occurs in the lower back area. You would feel this pain as low back pain. You may feel pain, numbness, weakness, or tingling in the buttocks or legs. Sciatica is a type of lumbar radicular pain that shoots down the back of the leg.  Radicular pain often goes away when you follow instructions from your  health care provider for relieving pain at home. Follow these instructions at home: Managing pain  If directed, apply ice to the affected area: ? Put ice in a plastic bag. ? Place a towel between your skin and the bag. ? Leave the ice on for 20 minutes, 2-3 times a day.  If directed, apply heat to the affected area as often as told by your health care provider. Use the heat source that your health care provider recommends, such as a moist heat pack or a heating pad. ? Place a towel between your skin and the heat source. ? Leave the heat on for 20-30 minutes. ? Remove the heat if your skin turns bright red. This is especially important if you are unable to feel pain, heat, or cold. You may have a greater risk of getting burned. Activity   Do not sit or rest in bed for long periods of time.  Try to stay as active as possible. Ask your health care provider what type of exercise or activity is best for you.  Avoid activities that make your pain worse, such as bending and lifting.  Do not lift anything that is heavier than 10 lb (4.5 kg). Practice using proper technique when lifting items. Proper lifting technique involves bending your knees and rising up.  Do strength and range-of-motion exercises only as told by your health care provider. General instructions  Take over-the-counter and prescription medicines only as told by your health care provider.  Pay attention to any changes in your symptoms.  Keep all follow-up visits as told by your health care provider. This is important. Contact a health care provider if:  Your pain and other symptoms get worse.  Your pain medicine is not helping.  Your pain has not improved after a few weeks of home care.  You have a fever. Get help right away if:  You have severe pain, weakness, or numbness.  You have difficulty with bladder or bowel control. This information is not intended to replace advice given to you by your health care  provider. Make sure you discuss any questions you have with your health care provider. Document Released: 10/05/2004 Document Revised: 02/03/2016 Document Reviewed: 03/24/2015 Elsevier Interactive Patient Education  2018 [employer]. Kidney Stones Kidney stones (urolithiasis) are rock-like masses that form inside of the kidneys. Kidneys are organs that make pee (urine). A kidney stone can cause very bad pain and can block the flow of pee. The stone usually leaves your body (passes) through your pee. You may need to have a doctor take out the stone. Follow these instructions at home: Eating and drinking  Drink enough fluid to keep your pee clear or pale yellow. This will help you pass the stone.  If told by your doctor, change the foods you eat (your diet). This may include: ? Limiting how much salt (sodium) you eat. ? Eating more fruits and vegetables. ? Limiting how much meat, poultry, fish,  and eggs you eat.  Follow instructions from your doctor about eating or drinking restrictions. General instructions  Collect pee samples as told by your doctor. You may need to collect a pee sample: ? 24 hours after a stone comes out. ? 8-12 weeks after a stone comes out, and every 6-12 months after that.  Strain your pee every time you pee (urinate), for as long as told. Use the strainer that your doctor recommends.  Do not throw out the stone. Keep it so that it can be tested by your doctor.  Take over-the-counter and prescription medicines only as told by your doctor.  Keep all follow-up visits as told by your doctor. This is important. You may need follow-up tests. Preventing kidney stones To prevent another kidney stone:  Drink enough fluid to keep your pee clear or pale yellow. This is the best way to prevent kidney stones.  Eat healthy foods.  Avoid certain foods as told by your doctor. You may be told to eat less protein.  Stay at a healthy weight.  Contact a doctor if:  You  have pain that gets worse or does not get better with medicine. Get help right away if:  You have a fever or chills.  You get very bad pain.  You get new pain in your belly (abdomen).  You pass out (faint).  You cannot pee. This information is not intended to replace advice given to you by your health care provider. Make sure you discuss any questions you have with your health care provider. Document Released: 02/14/2008 Document Revised: 05/16/2016 Document Reviewed: 05/16/2016 Elsevier Interactive Patient Education  2017 [employer] Inc. Dietary Guidelines to Help Prevent Kidney Stones Kidney stones are deposits of minerals and salts that form inside your kidneys. Your risk of developing kidney stones may be greater depending on your diet, your lifestyle, the medicines you take, and whether you have certain medical conditions. Most people can reduce their chances of developing kidney stones by following the instructions below. Depending on your overall health and the type of kidney stones you tend to develop, your dietitian may give you more specific instructions. What are tips for following this plan? Reading food labels  Choose foods with "no salt added" or "low-salt" labels. Limit your sodium intake to less than 1500 mg per day.  Choose foods with calcium for each meal and snack. Try to eat about 300 mg of calcium at each meal. Foods that contain 200-500 mg of calcium per serving include: ? 8 oz (237 ml) of milk, fortified nondairy milk, and fortified fruit juice. ? 8 oz (237 ml) of kefir, yogurt, and soy yogurt. ? 4 oz (118 ml) of tofu. ? 1 oz of cheese. ? 1 cup (300 g) of dried figs. ? 1 cup (91 g) of cooked broccoli. ? 1-3 oz can of sardines or mackerel.  Most people need 1000 to 1500 mg of calcium each day. Talk to your dietitian about how much calcium is recommended for you. Shopping  Buy plenty of fresh fruits and vegetables. Most people do not need to avoid fruits and  vegetables, even if they contain nutrients that may contribute to kidney stones.  When shopping for convenience foods, choose: ? Whole pieces of fruit. ? Premade salads with dressing on the side. ? Low-fat fruit and yogurt smoothies.  Avoid buying frozen meals or prepared deli foods.  Look for foods with live cultures, such as yogurt and kefir. Cooking  Do not add salt to food  when cooking. Place a salt shaker on the table and allow each person to add his or her own salt to taste.  Use vegetable protein, such as beans, textured vegetable protein (TVP), or tofu instead of meat in pasta, casseroles, and soups. Meal planning  Eat less salt, if told by your dietitian. To do this: ? Avoid eating processed or premade food. ? Avoid eating fast food.  Eat less animal protein, including cheese, meat, poultry, or fish, if told by your dietitian. To do this: ? Limit the number of times you have meat, poultry, fish, or cheese each week. Eat a diet free of meat at least 2 days a week. ? Eat only one serving each day of meat, poultry, fish, or seafood. ? When you prepare animal protein, cut pieces into small portion sizes. For most meat and fish, one serving is about the size of one deck of cards.  Eat at least 5 servings of fresh fruits and vegetables each day. To do this: ? Keep fruits and vegetables on hand for snacks. ? Eat 1 piece of fruit or a handful of berries with breakfast. ? Have a salad and fruit at lunch. ? Have two kinds of vegetables at dinner.  Limit foods that are high in a substance called oxalate. These include: ? Spinach. ? Rhubarb. ? Beets. ? Potato chips and french fries. ? Nuts.  If you regularly take a diuretic medicine, make sure to eat at least 1-2 fruits or vegetables high in potassium each day. These include: ? Avocado. ? Banana. ? Orange, prune, carrot, or tomato juice. ? Baked potato. ? Cabbage. ? Beans and split peas. General instructions  Drink enough  fluid to keep your urine clear or pale yellow. This is the most important thing you can do.  Talk to your health care provider and dietitian about taking daily supplements. Depending on your health and the cause of your kidney stones, you may be advised: ? Not to take supplements with vitamin C. ? To take a calcium supplement. ? To take a daily probiotic supplement. ? To take other supplements such as magnesium, fish oil, or vitamin B6.  Take all medicines and supplements as told by your health care provider.  Limit alcohol intake to no more than 1 drink a day for nonpregnant women and 2 drinks a day for men. One drink equals 12 oz of beer, 5 oz of wine, or 1 oz of hard liquor.  Lose weight if told by your health care provider. Work with your dietitian to find strategies and an eating plan that works best for you. What foods are not recommended? Limit your intake of the following foods, or as told by your dietitian. Talk to your dietitian about specific foods you should avoid based on the type of kidney stones and your overall health. Grains Breads. Bagels. Rolls. Baked goods. Salted crackers. Cereal. Pasta. Vegetables Spinach. Rhubarb. Beets. Canned vegetables. Rosita FirePickles. Olives. Meats and other protein foods Nuts. Nut butters. Large portions of meat, poultry, or fish. Salted or cured meats. Deli meats. Hot dogs. Sausages. Dairy Cheese. Beverages Regular soft drinks. Regular vegetable juice. Seasonings and other foods Seasoning blends with salt. Salad dressings. Canned soups. Soy sauce. Ketchup. Barbecue sauce. Canned pasta sauce. Casseroles. Pizza. Lasagna. Frozen meals. Potato chips. Jamaica[place] fries. Summary  You can reduce your risk of kidney stones by making changes to your diet.  The most important thing you can do is drink enough fluid. You should drink enough fluid to  keep your urine clear or pale yellow.  Ask your health care provider or dietitian how much protein from animal  sources you should eat each day, and also how much salt and calcium you should have each day. This information is not intended to replace advice given to you by your health care provider. Make sure you discuss any questions you have with your health care provider. Document Released: 12/23/2010 Document Revised: 08/08/2016 Document Reviewed: 08/08/2016 Elsevier Interactive Patient Education  2017 [employer].

## 2017-08-09 NOTE — ED Triage Notes (Signed)
Pt arrived via [facility] with complaints of Right sided flank pain that radiates to her pelvis that started 2 days ago. Pt states that the pain 10/10 is similar to the same pain she had 5 years ago when she had kidney stones. Pt is A&Ox4. EMS V/S 135/73 Hr 84, 94% RA 10/10. EMS gave 100 mcg of Fentanyl

## 2017-08-09 NOTE — ED Notes (Signed)
Bed: WA09 Expected date:  Expected time:  Means of arrival:  Comments: EMS groin pain with radiation hx kidney stones-fentanyl administered

## 2017-08-10 ENCOUNTER — Emergency Department (HOSPITAL_COMMUNITY): Payer: No Typology Code available for payment source

## 2017-08-10 LAB — URINALYSIS, ROUTINE W REFLEX MICROSCOPIC
Bilirubin Urine: NEGATIVE
GLUCOSE, UA: NEGATIVE mg/dL
Hgb urine dipstick: NEGATIVE
KETONES UR: NEGATIVE mg/dL
Nitrite: POSITIVE — AB
PROTEIN: NEGATIVE mg/dL
Specific Gravity, Urine: 1.023 (ref 1.005–1.030)
pH: 5 (ref 5.0–8.0)

## 2017-08-10 LAB — CBC WITH DIFFERENTIAL/PLATELET
Basophils Absolute: 0 10*3/uL (ref 0.0–0.1)
Basophils Relative: 1 %
Eosinophils Absolute: 0.3 10*3/uL (ref 0.0–0.7)
Eosinophils Relative: 4 %
HEMATOCRIT: 39.8 % (ref 36.0–46.0)
HEMOGLOBIN: 13.5 g/dL (ref 12.0–15.0)
LYMPHS ABS: 2.5 10*3/uL (ref 0.7–4.0)
LYMPHS PCT: 41 %
MCH: 31 pg (ref 26.0–34.0)
MCHC: 33.9 g/dL (ref 30.0–36.0)
MCV: 91.3 fL (ref 78.0–100.0)
Monocytes Absolute: 0.6 10*3/uL (ref 0.1–1.0)
Monocytes Relative: 10 %
NEUTROS ABS: 2.7 10*3/uL (ref 1.7–7.7)
NEUTROS PCT: 44 %
Platelets: 144 10*3/uL — ABNORMAL LOW (ref 150–400)
RBC: 4.36 MIL/uL (ref 3.87–5.11)
RDW: 12.2 % (ref 11.5–15.5)
WBC: 6 10*3/uL (ref 4.0–10.5)

## 2017-08-10 LAB — BASIC METABOLIC PANEL
Anion gap: 9 (ref 5–15)
BUN: 23 mg/dL — AB (ref 6–20)
CHLORIDE: 107 mmol/L (ref 101–111)
CO2: 22 mmol/L (ref 22–32)
Calcium: 8.9 mg/dL (ref 8.9–10.3)
Creatinine, Ser: 0.67 mg/dL (ref 0.44–1.00)
GFR calc Af Amer: >60 mL/min (ref >60)
GFR calc non Af Amer: >60 mL/min (ref >60)
GLUCOSE: 172 mg/dL — AB (ref 65–99)
POTASSIUM: 3.8 mmol/L (ref 3.5–5.1)
SODIUM: 138 mmol/L (ref 135–145)

## 2017-08-10 MED ORDER — CEPHALEXIN 500 MG PO CAPS: 500.0000 mg | ORAL_CAPSULE | Freq: Three times a day (TID) | ORAL | 0 refills | Status: DC

## 2017-08-10 MED ORDER — MORPHINE SULFATE (PF) 4 MG/ML IV SOLN
4.0000 mg | Freq: Once | INTRAVENOUS | Status: AC
  Administered 2017-08-10: 4 mg via INTRAVENOUS
  Filled 2017-08-10: qty 1

## 2017-08-10 MED ORDER — ONDANSETRON HCL 4 MG/2ML IJ SOLN
4.0000 mg | Freq: Once | INTRAMUSCULAR | Status: AC
  Administered 2017-08-10: 4 mg via INTRAVENOUS
  Filled 2017-08-10: qty 2

## 2017-08-10 MED ORDER — SODIUM CHLORIDE 0.9 % IV BOLUS (SEPSIS)
500.0000 mL | Freq: Once | INTRAVENOUS | Status: AC
  Administered 2017-08-10: 500 mL via INTRAVENOUS

## 2017-08-10 MED ORDER — ONDANSETRON 4 MG PO TBDP: 4.0000 mg | ORAL_TABLET | Freq: Three times a day (TID) | ORAL | 0 refills | Status: DC | PRN

## 2017-08-10 MED ORDER — HYDROCODONE-ACETAMINOPHEN 5-325 MG PO TABS: 1.0000 | ORAL_TABLET | ORAL | 0 refills | Status: DC | PRN

## 2017-08-10 NOTE — Discharge Instructions (Signed)
Take the prescribed medication as directed. Follow-up with your primary care doctor.  I do recommend you have a repeat urinalysis towards the end of your antibiotics course to ensure the infection is clearing. Return to the ED for new or worsening symptoms.

## 2017-08-10 NOTE — Progress Notes (Signed)
Pt made aware of normal results via email as she had already left work for the day. Instructed to f/u if new sx.

## 2017-08-10 NOTE — ED Provider Notes (Signed)
[facility]-EMERGENCY DEPT Provider Note   CSN: [id number] Arrival date & time: 08/09/17  2337     History   Chief Complaint Chief Complaint  Patient presents with  . Flank Pain    HPI Robin Chen is a 56 y.o. female.  The history is provided by the patient and medical records.  Flank Pain     56 y.o. F with hx of anemia, CKD, hx of kidney stones with episode of urosepsis in 2013, HTN, presenting to the ED with right flank pain.  States pain began 2 days ago, but worsened significantly last night.  Pain localized to the right flank with radiation to right groin.  She denies any significant urinary symptoms.  States she has had some nausea but denies vomiting.  Decreased appetite because of the pain.  States in the past she did require stenting because of complications with infection.  She has not had any fever or chills.  She was given fentanyl on the way to the ED, but reports only temporary relief.  Past Medical History:  Diagnosis Date  . Anemia   . Blood transfusion    2003  . Chronic kidney disease    Rt UPJ stone  . COPD (chronic obstructive pulmonary disease) (HCC)   . Headache(784.0)   . Hypertension     Patient Active Problem List   Diagnosis Date Noted  . Type 2 diabetes mellitus without complication, without long-term current use of insulin (HCC) 05/15/2017  . Fatty infiltration of liver 02/27/2017  . Urosepsis 01/09/2012  . Renal stone 01/09/2012  . Thrombocytopenia (HCC) 01/09/2012  . Acute renal insufficiency 01/09/2012    Past Surgical History:  Procedure Laterality Date  . CYSTOSCOPY W/ URETERAL STENT PLACEMENT    . DILATION AND CURETTAGE OF UTERUS     2003    OB History    No data available       Home Medications    Prior to Admission medications   Medication Sig Start Date End Date Taking? Authorizing Provider  acetaminophen (TYLENOL) 500 MG tablet Take 2 tablets (1,000 mg total) by mouth every 6 (six) hours as  needed for up to 3 days for mild pain or moderate pain. 08/09/17 08/12/17  Betancourt, Jarold Songina A, NP  cyclobenzaprine (FLEXERIL) 10 MG tablet Take 0.5-1 tablets (5-10 mg total) by mouth 3 (three) times daily as needed for up to 10 days for muscle spasms. 08/09/17 08/19/17  Betancourt, Jarold Songina A, NP  ibuprofen (ADVIL,MOTRIN) 200 MG tablet Take 200 mg by mouth every 6 (six) hours as needed for headache, mild pain or moderate pain.    [provider]  lisinopril (PRINIVIL,ZESTRIL) 5 MG tablet Take 0.5 tablets (2.5 mg total) by mouth daily. Patient not taking: Reported on 08/09/2017 11/17/16   Benjiman CoreWiseman, Brittany D, PA-C  metFORMIN (GLUCOPHAGE) 500 MG tablet Take 1 tablet (500 mg total) by mouth 2 (two) times daily with a meal. 05/15/17   McVey, Madelaine BhatElizabeth Whitney, PA-C  naproxen sodium (ALEVE) 220 MG tablet Take 1-2 tablets (220-440 mg total) by mouth 2 (two) times daily as needed for up to 14 days. 08/09/17 08/23/17  BetancourtJarold Song, Tina A, NP    Family History History reviewed. No pertinent family history.  Social History Social History   Tobacco Use  . Smoking status: Current Every Day Smoker    Packs/day: 1.00    Years: 30.00    Pack years: 30.00    Types: Cigarettes  . Smokeless tobacco: Never Used  Substance  Use Topics  . Alcohol use: No  . Drug use: No     Allergies   Penicillins and Ciprofloxacin   Review of Systems Review of Systems  Gastrointestinal: Positive for nausea.  Genitourinary: Positive for flank pain.  All other systems reviewed and are negative.    Physical Exam Updated Vital Signs BP 103/66 (BP Location: Right Arm)   Pulse 78   Temp 98 F (36.7 C) (Oral)   Resp 18   SpO2 94%   Physical Exam  Constitutional: She is oriented to person, place, and time. She appears well-developed and well-nourished.  HENT:  Head: Normocephalic and atraumatic.  Mouth/Throat: Oropharynx is clear and moist.  Eyes: Conjunctivae and EOM are normal. Pupils are equal, round, and  reactive to light.  Neck: Normal range of motion.  Cardiovascular: Normal rate, regular rhythm and normal heart sounds.  Pulmonary/Chest: Effort normal and breath sounds normal.  Abdominal: Soft. Bowel sounds are normal. There is tenderness in the suprapubic area. There is CVA tenderness (right).  Mild suprapubic tenderness; right CVA tenderness; normal bowel sounds, no distention  Musculoskeletal: Normal range of motion.  Neurological: She is alert and oriented to person, place, and time.  Skin: Skin is warm and dry.  Psychiatric: She has a normal mood and affect.  Nursing note and vitals reviewed.    ED Treatments / Results  Labs (all labs ordered are listed, but only abnormal results are displayed) Labs Reviewed  CBC WITH DIFFERENTIAL/PLATELET - Abnormal; Notable for the following components:      Result Value   Platelets 144 (*)    All other components within normal limits  BASIC METABOLIC PANEL  URINALYSIS, ROUTINE W REFLEX MICROSCOPIC    EKG  EKG Interpretation None       Radiology Ct Renal Stone Study  Result Date: 08/10/2017 CLINICAL DATA:  RIGHT flank pain radiating to pelvis, symptoms similar to prior kidney stones. History of stent placement, chronic kidney disease, diabetes. EXAM: CT ABDOMEN AND PELVIS WITHOUT CONTRAST TECHNIQUE: Multidetector CT imaging of the abdomen and pelvis was performed following the standard protocol without IV contrast. COMPARISON:  CT abdomen and pelvis September 05, 2017 FINDINGS: LOWER CHEST: Lung bases are clear. The visualized heart size is normal. No pericardial effusion. HEPATOBILIARY: Markedly hypodense liver compatible with steatosis with focal fatty sparing about the gallbladder fossa. Decompressed gallbladder. PANCREAS: Normal. SPLEEN: Normal. ADRENALS/URINARY TRACT: Kidneys are orthotopic, demonstrating normal size and morphology. No nephrolithiasis, hydronephrosis; limited assessment for renal masses on this nonenhanced  examination. The unopacified ureters are normal in course and caliber. Urinary bladder is partially distended and unremarkable. Normal adrenal glands. STOMACH/BOWEL: The stomach, small and large bowel are normal in course and caliber without inflammatory changes, sensitivity decreased by lack of enteric contrast. Normal appendix. VASCULAR/LYMPHATIC: Aortoiliac vessels are normal in course and caliber. Mild calcific atherosclerosis and small infrarenal aortic chronic dissection. No lymphadenopathy by CT size criteria. REPRODUCTIVE: Nonsuspicious.  9 mm calcification RIGHT adnexa. OTHER: No intraperitoneal free fluid or free air. Small fat containing umbilical hernia. MUSCULOSKELETAL: Non-acute. Degenerative lumbar spine, moderate canal stenosis L3-4 and mild neural foraminal narrowing. IMPRESSION: 1. No nephrolithiasis, obstructive uropathy nor acute intra-abdominal/pelvic process. Normal appendix. 2. Marked hepatic steatosis. Electronically Signed   By: Awilda Metro M.D.   On: 08/10/2017 01:18    Procedures Procedures (including critical care time)  Medications Ordered in ED Medications  morphine 4 MG/ML injection 4 mg (4 mg Intravenous Given 08/10/17 0039)  ondansetron (ZOFRAN) injection 4 mg (4 mg  Intravenous Given 08/10/17 0039)  sodium chloride 0.9 % bolus 500 mL (500 mLs Intravenous New Bag/Given 08/10/17 0045)     Initial Impression / Assessment and Plan / ED Course  I have reviewed the triage vital signs and the nursing notes.  Pertinent labs & imaging results that were available during my care of the patient were reviewed by me and considered in my medical decision making (see chart for details).  56 year old female here with right flank pain for the past 2 days.  Worsening yesterday.  She is afebrile and nontoxic in appearance here.  Does appear uncomfortable.  Right CVA tenderness noted.  Does have history of kidney stones with subsequent urosepsis.  We will plan for screening labs,  CT renal study.  IV pain and nausea medications ordered as well as small bolus of IV fluids.  CT without evidence of acute stone.  Screening lab work overall reassuring.  UA with many bacteria, nitrite positive.  Suspect this is the source of her pain.  Patient has no fever or tachycardia here, normal white blood cell count, and is nontoxic in appearance.  She does not meet any SIRS criteria and does not appear to be uroseptic at this time.  Will start on antibiotics, pain medicine.  I recommended that she follow-up with her primary care doctor for repeat urinalysis in about a week or so to ensure this is clearing.  Discussed plan with patient, she acknowledged understanding and agreed with plan of care.  Return precautions given for new or worsening symptoms.  Final Clinical Impressions(s) / ED Diagnoses   Final diagnoses:  Flank pain  Acute cystitis without hematuria    ED Discharge Orders        Ordered    HYDROcodone-acetaminophen (NORCO/VICODIN) 5-325 MG tablet  Every 4 hours PRN     08/10/17 0451    ondansetron (ZOFRAN ODT) 4 MG disintegrating tablet  Every 8 hours PRN     08/10/17 0451    cephALEXin (KEFLEX) 500 MG capsule  3 times daily     08/10/17 0451       Garlon HatchetSanders, Faithanne Verret M, PA-C 08/10/17 [id number]    Zadie RhineWickline, Donald, MD 08/10/17 [phone]

## 2017-08-13 ENCOUNTER — Other Ambulatory Visit: Payer: Self-pay

## 2017-08-13 ENCOUNTER — Ambulatory Visit: Payer: PRIVATE HEALTH INSURANCE | Admitting: Family Medicine

## 2017-08-13 ENCOUNTER — Encounter: Payer: Self-pay | Admitting: Family Medicine

## 2017-08-13 VITALS — BP 132/84 | HR 114 | Temp 98.4°F | Wt 186.4 lb

## 2017-08-13 DIAGNOSIS — K5900 Constipation, unspecified: Secondary | ICD-10-CM

## 2017-08-13 DIAGNOSIS — M545 Low back pain: Secondary | ICD-10-CM

## 2017-08-13 LAB — POCT URINALYSIS DIP (MANUAL ENTRY)
Blood, UA: NEGATIVE
Glucose, UA: NEGATIVE mg/dL
Nitrite, UA: NEGATIVE
Spec Grav, UA: 1.02 (ref 1.010–1.025)
Urobilinogen, UA: >=8 E.U./dL — AB
pH, UA: 7 (ref 5.0–8.0)

## 2017-08-13 MED ORDER — CYCLOBENZAPRINE HCL 10 MG PO TABS: 5.0000 mg | ORAL_TABLET | Freq: Three times a day (TID) | ORAL | 0 refills | Status: AC | PRN

## 2017-08-13 MED ORDER — IBUPROFEN 800 MG PO TABS: 800.0000 mg | ORAL_TABLET | Freq: Four times a day (QID) | ORAL | 0 refills | Status: DC | PRN

## 2017-08-13 NOTE — Patient Instructions (Addendum)
1. Increase ibuprofen to 800mg  every 8 hours as needed, take with food 2. Start cyclobenzaprine 10mg  every 8 hours as needed 3. Continue with miralax use, if not had bowel movement in the next 24 hours, do 1/4-1/2 bottle of magnesium citrate (over the counter) 3. Warm compresses and gentle stretches   Constipation, Adult Constipation is when a person:  Poops (has a bowel movement) fewer times in a week than normal.  Has a hard time pooping.  Has poop that is dry, hard, or bigger than normal.  Follow these instructions at home: Eating and drinking   Eat foods that have a lot of fiber, such as: ? Fresh fruits and vegetables. ? Whole grains. ? Beans.  Eat less of foods that are high in fat, low in fiber, or overly processed, such as: ? [place] fries. ? Hamburgers. ? Cookies. ? Candy. ? Soda.  Drink enough fluid to keep your pee (urine) clear or pale yellow. General instructions  Exercise regularly or as told by your doctor.  Go to the restroom when you feel like you need to poop. Do not hold it in.  Take over-the-counter and prescription medicines only as told by your doctor. These include any fiber supplements.  Do pelvic floor retraining exercises, such as: ? Doing deep breathing while relaxing your lower belly (abdomen). ? Relaxing your pelvic floor while pooping.  Watch your condition for any changes.  Keep all follow-up visits as told by your doctor. This is important. Contact a doctor if:  You have pain that gets worse.  You have a fever.  You have not pooped for 4 days.  You throw up (vomit).  You are not hungry.  You lose weight.  You are bleeding from the anus.  You have thin, pencil-like poop (stool). Get help right away if:  You have a fever, and your symptoms suddenly get worse.  You leak poop or have blood in your poop.  Your belly feels hard or bigger than normal (is bloated).  You have very bad belly pain.  You feel dizzy or you  faint. This information is not intended to replace advice given to you by your health care provider. Make sure you discuss any questions you have with your health care provider. Document Released: 02/14/2008 Document Revised: 03/17/2016 Document Reviewed: 02/16/2016 Elsevier Interactive Patient Education  2017 Elsevier Inc.  Low Back Sprain Rehab Ask your health care provider which exercises are safe for you. Do exercises exactly as told by your health care provider and adjust them as directed. It is normal to feel mild stretching, pulling, tightness, or discomfort as you do these exercises, but you should stop right away if you feel sudden pain or your pain gets worse. Do not begin these exercises until told by your health care provider. Stretching and range of motion exercises These exercises warm up your muscles and joints and improve the movement and flexibility of your back. These exercises also help to relieve pain, numbness, and tingling. Exercise A: Lumbar rotation  1. Lie on your back on a firm surface and bend your knees. 2. Straighten your arms out to your sides so each arm forms an "L" shape with a side of your body (a 90 degree angle). 3. Slowly move both of your knees to one side of your body until you feel a stretch in your lower back. Try not to let your shoulders move off of the floor. 4. Hold for __________ seconds. 5. Tense your abdominal muscles and  slowly move your knees back to the starting position. 6. Repeat this exercise on the other side of your body. Repeat __________ times. Complete this exercise __________ times a day. Exercise B: Prone extension on elbows  1. Lie on your abdomen on a firm surface. 2. Prop yourself up on your elbows. 3. Use your arms to help lift your chest up until you feel a gentle stretch in your abdomen and your lower back. ? This will place some of your body weight on your elbows. If this is uncomfortable, try stacking pillows under your  chest. ? Your hips should stay down, against the surface that you are lying on. Keep your hip and back muscles relaxed. 4. Hold for __________ seconds. 5. Slowly relax your upper body and return to the starting position. Repeat __________ times. Complete this exercise __________ times a day. Strengthening exercises These exercises build strength and endurance in your back. Endurance is the ability to use your muscles for a long time, even after they get tired. Exercise C: Pelvic tilt 1. Lie on your back on a firm surface. Bend your knees and keep your feet flat. 2. Tense your abdominal muscles. Tip your pelvis up toward the ceiling and flatten your lower back into the floor. ? To help with this exercise, you may place a small towel under your lower back and try to push your back into the towel. 3. Hold for __________ seconds. 4. Let your muscles relax completely before you repeat this exercise. Repeat __________ times. Complete this exercise __________ times a day. Exercise D: Alternating arm and leg raises  1. Get on your hands and knees on a firm surface. If you are on a hard floor, you may want to use padding to cushion your knees, such as an exercise mat. 2. Line up your arms and legs. Your hands should be below your shoulders, and your knees should be below your hips. 3. Lift your left leg behind you. At the same time, raise your right arm and straighten it in front of you. ? Do not lift your leg higher than your hip. ? Do not lift your arm higher than your shoulder. ? Keep your abdominal and back muscles tight. ? Keep your hips facing the ground. ? Do not arch your back. ? Keep your balance carefully, and do not hold your breath. 4. Hold for __________ seconds. 5. Slowly return to the starting position and repeat with your right leg and your left arm. Repeat __________ times. Complete this exercise __________ times a day. Exercise E: Abdominal set with straight leg raise  1. Lie  on your back on a firm surface. 2. Bend one of your knees and keep your other leg straight. 3. Tense your abdominal muscles and lift your straight leg up, 4-6 inches (10-15 cm) off the ground. 4. Keep your abdominal muscles tight and hold for __________ seconds. ? Do not hold your breath. ? Do not arch your back. Keep it flat against the ground. 5. Keep your abdominal muscles tense as you slowly lower your leg back to the starting position. 6. Repeat with your other leg. Repeat __________ times. Complete this exercise __________ times a day. Posture and body mechanics  Body mechanics refers to the movements and positions of your body while you do your daily activities. Posture is part of body mechanics. Good posture and healthy body mechanics can help to relieve stress in your body's tissues and joints. Good posture means that your spine is in  its natural S-curve position (your spine is neutral), your shoulders are pulled back slightly, and your head is not tipped forward. The following are general guidelines for applying improved posture and body mechanics to your everyday activities. Standing   When standing, keep your spine neutral and your feet about hip-width apart. Keep a slight bend in your knees. Your ears, shoulders, and hips should line up.  When you do a task in which you stand in one place for a long time, place one foot up on a stable object that is 2-4 inches (5-10 cm) high, such as a footstool. This helps keep your spine neutral. Sitting   When sitting, keep your spine neutral and keep your feet flat on the floor. Use a footrest, if necessary, and keep your thighs parallel to the floor. Avoid rounding your shoulders, and avoid tilting your head forward.  When working at a desk or a computer, keep your desk at a height where your hands are slightly lower than your elbows. Slide your chair under your desk so you are close enough to maintain good posture.  When working at a  computer, place your monitor at a height where you are looking straight ahead and you do not have to tilt your head forward or downward to look at the screen. Resting   When lying down and resting, avoid positions that are most painful for you.  If you have pain with activities such as sitting, bending, stooping, or squatting (flexion-based activities), lie in a position in which your body does not bend very much. For example, avoid curling up on your side with your arms and knees near your chest (fetal position).  If you have pain with activities such as standing for a long time or reaching with your arms (extension-based activities), lie with your spine in a neutral position and bend your knees slightly. Try the following positions:  Lying on your side with a pillow between your knees.  Lying on your back with a pillow under your knees. Lifting   When lifting objects, keep your feet at least shoulder-width apart and tighten your abdominal muscles.  Bend your knees and hips and keep your spine neutral. It is important to lift using the strength of your legs, not your back. Do not lock your knees straight out.  Always ask for help to lift heavy or awkward objects. This information is not intended to replace advice given to you by your health care provider. Make sure you discuss any questions you have with your health care provider. Document Released: 08/28/2005 Document Revised: 05/04/2016 Document Reviewed: 06/09/2015 Elsevier Interactive Patient Education  2018 [employer]   IF you received an x-ray today, you will receive an invoice from [facility]. Please contact [facility] at [phone] with questions or concerns regarding your invoice.   IF you received labwork today, you will receive an invoice from [facility]. Please contact [facility] at [phone] with questions or concerns regarding your invoice.   Our billing staff will not be able to assist you with  questions regarding bills from these companies.  You will be contacted with the lab results as soon as they are available. The fastest way to get your results is to activate your My Chart account. Instructions are located on the last page of this paperwork. If you have not heard from us regarding the results in 2 weeks, please contact this office.

## 2017-08-13 NOTE — Progress Notes (Signed)
12/3/201811:37 AM  Robin Chen 01-Oct-1960, 56 y.o. female [id number]  Chief Complaint  Patient presents with  . Urinary Tract Infection    back pain on the right side. painful urination, has not been able to defacate in 3 days.    HPI:   Patient is a 56 y.o. female with past medical history significant for kidney stones who presents today for right sided low back pain that radiates to her groin and down anterior thigh.  She was seen in ED 11/29. CT renal stone protocol was negative. UA was + L/N. Started on keflex. Urinary symptoms have resolved. Still having low back pain. Denies any trauma, numbness, tingling, weakness. Also reports constipation that started after pain medication, last BM was 3 days ago, she is passing [place], no nausea or vomiting. She denies any fever or chills.  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 But she did not take as she went to ER later that day. She did take ibu 600mg  last night and was able to sleep. She also bought some miralax OTC and took one dose last night.  [facility] 08/13/2017 05/15/2017 02/14/2017  Decreased Interest 0 0 0  Down, Depressed, Hopeless 0 0 0  PHQ - 2 Score 0 0 0    Allergies  Allergen Reactions  . Penicillins Rash and Other (See Comments)    Has patient had a PCN reaction causing immediate rash, facial/tongue/throat swelling, SOB or lightheadedness with hypotension: No Has patient had a PCN reaction causing severe rash involving mucus membranes or skin necrosis: No Has patient had a PCN reaction that required hospitalization No Has patient had a PCN reaction occurring within the last 10 years: No If all of the above answers are "NO", then may proceed with Cephalosporin use.  . Ciprofloxacin Rash    Prior to Admission medications   Medication Sig Start Date End Date Taking? Authorizing Provider  cephALEXin (KEFLEX) 500 MG capsule Take 1 capsule (500 mg total) by mouth 3 (three) times daily. 08/10/17  Yes Garlon HatchetSanders, Lisa  M, PA-C  cyclobenzaprine (FLEXERIL) 10 MG tablet Take 0.5-1 tablets (5-10 mg total) by mouth 3 (three) times daily as needed for up to 10 days for muscle spasms. 08/09/17 08/19/17 Yes Betancourt, Jarold Songina A, NP  HYDROcodone-acetaminophen (NORCO/VICODIN) 5-325 MG tablet Take 1 tablet by mouth every 4 (four) hours as needed. 08/10/17  Yes Garlon HatchetSanders, Lisa M, PA-C  ibuprofen (ADVIL,MOTRIN) 200 MG tablet Take 200 mg by mouth every 6 (six) hours as needed for headache, mild pain or moderate pain.   Yes [provider]  lisinopril (PRINIVIL,ZESTRIL) 5 MG tablet Take 0.5 tablets (2.5 mg total) by mouth daily. 11/17/16  Yes Barnett AbuWiseman, [place]ittany D, PA-C  metFORMIN (GLUCOPHAGE) 500 MG tablet Take 1 tablet (500 mg total) by mouth 2 (two) times daily with a meal. Patient taking differently: Take 500 mg by mouth daily with breakfast.  05/15/17  Yes McVey, Madelaine BhatElizabeth Whitney, PA-C  naproxen sodium (ALEVE) 220 MG tablet Take 1-2 tablets (220-440 mg total) by mouth 2 (two) times daily as needed for up to 14 days. 08/09/17 08/23/17 Yes Betancourt, Jarold Songina A, NP  ondansetron (ZOFRAN ODT) 4 MG disintegrating tablet Take 1 tablet (4 mg total) by mouth every 8 (eight) hours as needed for nausea. 08/10/17  Yes Garlon HatchetSanders, Lisa M, PA-C    Past Medical History:  Diagnosis Date  . Anemia   . Blood transfusion    2003  . Chronic kidney disease    Rt UPJ stone  .  COPD (chronic obstructive pulmonary disease) (HCC)   . Headache(784.0)   . Hypertension     Past Surgical History:  Procedure Laterality Date  . CYSTOSCOPY W/ URETERAL STENT PLACEMENT    . DILATION AND CURETTAGE OF UTERUS     2003    Social History   Tobacco Use  . Smoking status: Current Every Day Smoker    Packs/day: 1.00    Years: 30.00    Pack years: 30.00    Types: Cigarettes  . Smokeless tobacco: Never Used  Substance Use Topics  . Alcohol use: No    History reviewed. No pertinent family history.  ROS Per HPI  OBJECTIVE:  Blood pressure  132/84, pulse (!) 114, temperature 98.4 F (36.9 C), weight 186 lb 6.4 oz (84.6 kg), SpO2 96 %.  Physical Exam  Constitutional: She is oriented to person, place, and time and well-developed, well-nourished, and in no distress.  HENT:  Head: Normocephalic and atraumatic.  Mouth/Throat: Oropharynx is clear and moist. No oropharyngeal exudate.  Eyes: EOM are normal. Pupils are equal, round, and reactive to light. No scleral icterus.  Neck: Neck supple.  Cardiovascular: Normal rate, regular rhythm and normal heart sounds. Exam reveals no gallop and no friction rub.  No murmur heard. Pulmonary/Chest: Effort normal and breath sounds normal. She has no wheezes. She has no rales.  Abdominal: Soft. Bowel sounds are normal. She exhibits no distension. There is no tenderness. There is no CVA tenderness.  Musculoskeletal: She exhibits no edema.       Lumbar back: She exhibits tenderness and spasm.  Neurological: She is alert and oriented to person, place, and time. She has normal sensation, normal strength and normal reflexes. She has a normal Straight Leg Raise Test. Gait normal.  Skin: Skin is warm and dry.      Results for orders placed or performed in visit on 08/13/17 (from the past 24 hour(s))  POCT urinalysis dipstick     Status: Abnormal   Collection Time: 08/13/17 11:28 AM  Result Value Ref Range   Color, UA yellow yellow   Clarity, UA clear clear   Glucose, UA negative negative mg/dL   Bilirubin, UA small (A) negative   Ketones, POC UA trace (5) (A) negative mg/dL   Spec Grav, UA 1.6101.020 9.6041.010 - 1.025   Blood, UA negative negative   pH, UA 7.0 5.0 - 8.0   Protein Ur, POC trace (A) negative mg/dL   Urobilinogen, UA >=5.4>=8.0 (A) 0.2 or 1.0 E.U./dL   Nitrite, UA Negative Negative   Leukocytes, UA Trace (A) Negative    ASSESSMENT and PLAN 1. Acute low back pain, unspecified back pain laterality, with sciatica presence unspecified Discussed supportive measures, new meds r/se/b and RTC  precautions. Patient educational handout given. - POCT urinalysis dipstick - ibuprofen (IBU) 800 MG tablet; Take 1 tablet (800 mg total) by mouth every 6 (six) hours as needed for moderate pain. - cyclobenzaprine (FLEXERIL) 10 MG tablet; Take 0.5-1 tablets (5-10 mg total) by mouth 3 (three) times daily as needed for up to 10 days for muscle spasms.  2. Constipation, unspecified constipation type Discussed continued use of miralax, discussed possible use of mag citrate if no BM with miralax. RTC precautions reviewed.  Other orders Complete antibiotic course  Return in about 3 days (around 08/16/2017) for eval back pain.    Myles LippsIrma M Santiago, MD [facility] [address] [place], [place] [address] Ph.  [phone] Fax [phone]

## 2017-08-15 ENCOUNTER — Ambulatory Visit: Payer: PRIVATE HEALTH INSURANCE | Admitting: Physician Assistant

## 2017-08-16 ENCOUNTER — Other Ambulatory Visit: Payer: Self-pay

## 2017-08-16 ENCOUNTER — Encounter: Payer: Self-pay | Admitting: Family Medicine

## 2017-08-16 ENCOUNTER — Ambulatory Visit: Payer: PRIVATE HEALTH INSURANCE | Admitting: Family Medicine

## 2017-08-16 VITALS — BP 132/58 | HR 100 | Temp 98.7°F | Wt 109.4 lb

## 2017-08-16 DIAGNOSIS — Z87440 Personal history of urinary (tract) infections: Secondary | ICD-10-CM

## 2017-08-16 DIAGNOSIS — M545 Low back pain: Secondary | ICD-10-CM

## 2017-08-16 LAB — POCT URINALYSIS DIP (MANUAL ENTRY)
Bilirubin, UA: NEGATIVE
Blood, UA: NEGATIVE
Glucose, UA: NEGATIVE mg/dL
Ketones, POC UA: NEGATIVE mg/dL
Leukocytes, UA: NEGATIVE
Nitrite, UA: NEGATIVE
Protein Ur, POC: NEGATIVE mg/dL
Spec Grav, UA: 1.01 (ref 1.010–1.025)
Urobilinogen, UA: 0.2 E.U./dL
pH, UA: 5.5 (ref 5.0–8.0)

## 2017-08-16 NOTE — Patient Instructions (Addendum)
1. Urine back to normal. Complete antibiotic.    IF you received an x-ray today, you will receive an invoice from [facility]. Please contact [facility] at [phone] with questions or concerns regarding your invoice.   IF you received labwork today, you will receive an invoice from [facility]. Please contact [facility] at [phone] with questions or concerns regarding your invoice.   Our billing staff will not be able to assist you with questions regarding bills from these companies.  You will be contacted with the lab results as soon as they are available. The fastest way to get your results is to activate your My Chart account. Instructions are located on the last page of this paperwork. If you have not heard from us regarding the results in 2 weeks, please contact this office.

## 2017-08-16 NOTE — Progress Notes (Signed)
12/6/201810:12 AM  Robin Chen [birth year], 56 y.o. female [id number]  Chief Complaint  Patient presents with  . Abdominal Pain    3 day f/u    HPI:   Patient is a 56 y.o. female who presents today for follow-up. States that she is feeling much better, back pain significantly improved, urine clear. Has yet a couple of days of abx, tolerating them well. She is also taking meds rx for MSK back pain. She has taken this week off from work under her PTO. Has no acute concerns today.  [facility] 08/16/2017 08/13/2017 05/15/2017  Decreased Interest 0 0 0  Down, Depressed, Hopeless 0 0 0  PHQ - 2 Score 0 0 0    Allergies  Allergen Reactions  . Penicillins Rash and Other (See Comments)    Has patient had a PCN reaction causing immediate rash, facial/tongue/throat swelling, SOB or lightheadedness with hypotension: No Has patient had a PCN reaction causing severe rash involving mucus membranes or skin necrosis: No Has patient had a PCN reaction that required hospitalization No Has patient had a PCN reaction occurring within the last 10 years: No If all of the above answers are "NO", then may proceed with Cephalosporin use.  . Ciprofloxacin Rash    Prior to Admission medications   Medication Sig Start Date End Date Taking? Authorizing Provider  cephALEXin (KEFLEX) 500 MG capsule Take 1 capsule (500 mg total) by mouth 3 (three) times daily. 08/10/17   Garlon HatchetSanders, Lisa M, PA-C  cyclobenzaprine (FLEXERIL) 10 MG tablet Take 0.5-1 tablets (5-10 mg total) by mouth 3 (three) times daily as needed for up to 10 days for muscle spasms. 08/13/17 08/23/17  Myles LippsSantiago, Irma M, MD  HYDROcodone-acetaminophen (NORCO/VICODIN) 5-325 MG tablet Take 1 tablet by mouth every 4 (four) hours as needed. 08/10/17   Garlon HatchetSanders, Lisa M, PA-C  ibuprofen (IBU) 800 MG tablet Take 1 tablet (800 mg total) by mouth every 6 (six) hours as needed for moderate pain. 08/13/17   Myles LippsSantiago, Irma M, MD  lisinopril  (PRINIVIL,ZESTRIL) 5 MG tablet Take 0.5 tablets (2.5 mg total) by mouth daily. 11/17/16   Benjiman CoreWiseman, Brittany D, PA-C  metFORMIN (GLUCOPHAGE) 500 MG tablet Take 1 tablet (500 mg total) by mouth 2 (two) times daily with a meal. Patient taking differently: Take 500 mg by mouth daily with breakfast.  05/15/17   McVey, Madelaine BhatElizabeth Whitney, PA-C  ondansetron (ZOFRAN ODT) 4 MG disintegrating tablet Take 1 tablet (4 mg total) by mouth every 8 (eight) hours as needed for nausea. 08/10/17   Garlon HatchetSanders, Lisa M, PA-C    Past Medical History:  Diagnosis Date  . Anemia   . Blood transfusion    2003  . Chronic kidney disease    Rt UPJ stone  . COPD (chronic obstructive pulmonary disease) (HCC)   . Headache(784.0)   . Hypertension     Past Surgical History:  Procedure Laterality Date  . CYSTOSCOPY W/ URETERAL STENT PLACEMENT    . DILATION AND CURETTAGE OF UTERUS     2003    Social History   Tobacco Use  . Smoking status: Current Every Day Smoker    Packs/day: 1.00    Years: 30.00    Pack years: 30.00    Types: Cigarettes  . Smokeless tobacco: Never Used  Substance Use Topics  . Alcohol use: No    History reviewed. No pertinent family history.  ROS Per HPI  OBJECTIVE:  Blood pressure (!) 132/58, pulse 100, temperature 98.7 F (  37.1 C), temperature source Oral, weight 109 lb 6.4 oz (49.6 kg), SpO2 97 %.  Physical Exam  Constitutional: She is oriented to person, place, and time and well-developed, well-nourished, and in no distress.  HENT:  Head: Normocephalic and atraumatic.  Mouth/Throat: Mucous membranes are normal.  Eyes: EOM are normal. Pupils are equal, round, and reactive to light. No scleral icterus.  Neck: Neck supple.  Pulmonary/Chest: Effort normal.  Neurological: She is alert and oriented to person, place, and time. Gait normal.  Skin: Skin is warm and dry.  Psychiatric: Mood and affect normal.  Nursing note and vitals reviewed.     Results for orders placed or performed  in visit on 08/16/17 (from the past 24 hour(s))  POCT urinalysis dipstick     Status: None   Collection Time: 08/16/17 10:24 AM  Result Value Ref Range   Color, UA yellow yellow   Clarity, UA clear clear   Glucose, UA negative negative mg/dL   Bilirubin, UA negative negative   Ketones, POC UA negative negative mg/dL   Spec Grav, UA 1.6101.010 9.6041.010 - 1.025   Blood, UA negative negative   pH, UA 5.5 5.0 - 8.0   Protein Ur, POC negative negative mg/dL   Urobilinogen, UA 0.2 0.2 or 1.0 E.U./dL   Nitrite, UA Negative Negative   Leukocytes, UA Negative Negative    No results found.   ASSESSMENT and PLAN 1. Acute low back pain, unspecified back pain laterality, with sciatica presence unspecified  2. History of UTI - POCT urinalysis dipstick  Resolving, cont with supportive measures. Complete abx course.   Return if symptoms worsen or fail to improve.    Myles LippsIrma M Santiago, MD [facility] at [facility] [address] [place], [place] [address] Ph.  [phone] Fax [phone]

## 2017-09-18 ENCOUNTER — Encounter: Payer: Self-pay | Admitting: Registered Nurse

## 2017-09-18 ENCOUNTER — Ambulatory Visit: Payer: Self-pay | Admitting: Registered Nurse

## 2017-09-18 VITALS — BP 111/88 | HR 97 | Temp 98.5°F

## 2017-09-18 DIAGNOSIS — M25551 Pain in right hip: Secondary | ICD-10-CM

## 2017-09-18 NOTE — Progress Notes (Signed)
Subjective:    Patient ID: Robin Chen, female    DOB: December 22, 1960, 57 y.o.   MRN: [id number]  57y/o caucasian female established patient here for right hip pain that started last night would like refill on her motrin.  Patient was seen Nov 2018 for low back pain and then went to ER diagnosed with UTI was out of work 10 days finished round of keflex.  Patient stated this pain does not feel like Nov 2018 pain.  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  Denied trauma, rash, swelling, bruising, falls, injury.  Stated urination normal color/smell denied bloody.      Review of Systems  Constitutional: Negative for activity change, appetite change, chills, diaphoresis, fatigue and fever.  HENT: Negative for trouble swallowing and voice change.   Eyes: Negative for photophobia and visual disturbance.  Respiratory: Negative for cough and shortness of breath.   Cardiovascular: Negative for leg swelling.  Gastrointestinal: Negative for abdominal distention, abdominal pain, constipation, diarrhea, nausea and vomiting.  Endocrine: Negative for cold intolerance and heat intolerance.  Genitourinary: Negative for dysuria, flank pain and hematuria.  Musculoskeletal: Positive for arthralgias and myalgias. Negative for back pain, gait problem, joint swelling, neck pain and neck stiffness.  Skin: Negative for color change, pallor, rash and wound.  Allergic/Immunologic: Negative for food allergies.  Neurological: Negative for dizziness, tremors, seizures, syncope, facial asymmetry, speech difficulty, weakness, light-headedness, numbness and headaches.  Hematological: Negative for adenopathy. Does not bruise/bleed easily.  Psychiatric/Behavioral: Negative for agitation, confusion and sleep disturbance.       Objective:   Physical Exam  Constitutional: She is oriented to person, place, and time. Vital signs are normal. She appears well-developed and well-nourished.  Non-toxic  appearance. She does not have a sickly appearance. She does not appear ill. No distress.  HENT:  Head: Normocephalic and atraumatic.  Right Ear: Hearing and external ear normal.  Left Ear: Hearing and external ear normal.  Nose: Nose normal.  Mouth/Throat: Uvula is midline, oropharynx is clear and moist and mucous membranes are normal. No oropharyngeal exudate.  Eyes: Conjunctivae, EOM and lids are normal. Pupils are equal, round, and reactive to light. Right eye exhibits no discharge. Left eye exhibits no discharge. No scleral icterus.  Neck: Trachea normal, normal range of motion and phonation normal. Neck supple. No muscular tenderness present. No neck rigidity. No tracheal deviation, no edema, no erythema and normal range of motion present.  Cardiovascular: Normal rate, regular rhythm, normal heart sounds and intact distal pulses. Exam reveals no gallop and no friction rub.  No murmur heard. Pulmonary/Chest: Effort normal and breath sounds normal. No accessory muscle usage or stridor. No respiratory distress. She has no decreased breath sounds. She has no wheezes. She has no rhonchi. She has no rales.  Abdominal: Soft. Normal appearance. She exhibits no shifting dullness, no distension, no pulsatile liver, no fluid wave, no abdominal bruit, no ascites, no pulsatile midline mass and no mass. There is no tenderness. There is no rigidity, no rebound, no guarding and no CVA tenderness. Hernia confirmed negative in the ventral area.  Musculoskeletal: Normal range of motion. She exhibits tenderness. She exhibits no edema or deformity.       Right shoulder: Normal.       Left shoulder: Normal.       Right elbow: Normal.      Left elbow: Normal.       Right hip: She exhibits tenderness. She exhibits normal  range of motion, normal strength, no bony tenderness, no swelling, no crepitus, no deformity and no laceration.       Left hip: Normal.       Right knee: Normal.       Left knee: Normal.        Right ankle: Normal.       Left ankle: Normal.       Cervical back: Normal.       Thoracic back: Normal.       Lumbar back: She exhibits tenderness and pain. She exhibits normal range of motion, no bony tenderness, no swelling, no edema, no deformity, no laceration, no spasm and normal pulse.       Right hand: Normal.       Left hand: Normal.       Legs: Right SI joint TTP no crepitus full AROM gait sure and steady in hallway normal heel toe gait; TTP right hip flexors anterior and lateral also  Lymphadenopathy:       Head (right side): No submental, no submandibular, no tonsillar, no preauricular, no posterior auricular and no occipital adenopathy present.       Head (left side): No submental, no submandibular, no tonsillar, no preauricular, no posterior auricular and no occipital adenopathy present.    She has no cervical adenopathy.       Right cervical: No superficial cervical, no deep cervical and no posterior cervical adenopathy present.      Left cervical: No superficial cervical, no deep cervical and no posterior cervical adenopathy present.  Neurological: She is alert and oriented to person, place, and time. She has normal strength. She is not disoriented. She displays no atrophy and no tremor. No cranial nerve deficit or sensory deficit. She exhibits normal muscle tone. She displays no seizure activity. Coordination and gait normal. GCS eye subscore is 4. GCS verbal subscore is 5. GCS motor subscore is 6.  Bilateral hand grasp; extremity strength equal bilaterally 5/5; in/out of chair without difficulty  Skin: Skin is warm, dry and intact. No abrasion, no bruising, no burn, no ecchymosis, no laceration, no lesion, no petechiae and no rash noted. She is not diaphoretic. No cyanosis or erythema. No pallor. Nails show no clubbing.  Psychiatric: She has a normal mood and affect. Her speech is normal and behavior is normal. Judgment and thought content normal. Cognition and memory are normal.   Vitals reviewed.         Assessment & Plan:  A-right hip pain  P-motrin 800mg  po TID prn pain #30 RF0 take with food; Patient was instructed to rest, ice/heat 15 minutes TID, stretches daily.  Wear supporting shoes, check ergonomics on workstation with computer and lifting.  Break up jobs into smaller parts for lifting.  Hydrate to keep urine pale yellow clear. Medications as directed. Patient may take NSAIDS as needed. Call or return to clinic as needed if these symptoms worsen or fail to improve as anticipated. Patient verbalized agreement and understanding of treatment plan and had no further questions at this time.

## 2017-09-18 NOTE — Patient Instructions (Signed)

## 2017-11-05 ENCOUNTER — Ambulatory Visit: Payer: PRIVATE HEALTH INSURANCE

## 2017-11-05 ENCOUNTER — Other Ambulatory Visit: Payer: Self-pay

## 2017-11-05 ENCOUNTER — Encounter: Payer: Self-pay | Admitting: Physician Assistant

## 2017-11-05 ENCOUNTER — Ambulatory Visit: Payer: PRIVATE HEALTH INSURANCE | Admitting: Physician Assistant

## 2017-11-05 ENCOUNTER — Ambulatory Visit (INDEPENDENT_AMBULATORY_CARE_PROVIDER_SITE_OTHER): Payer: PRIVATE HEALTH INSURANCE

## 2017-11-05 VITALS — BP 120/78 | HR 115 | Temp 99.2°F | Resp 18 | Ht 65.35 in | Wt 190.8 lb

## 2017-11-05 DIAGNOSIS — R109 Unspecified abdominal pain: Secondary | ICD-10-CM

## 2017-11-05 DIAGNOSIS — D72829 Elevated white blood cell count, unspecified: Secondary | ICD-10-CM | POA: Diagnosis not present

## 2017-11-05 DIAGNOSIS — R52 Pain, unspecified: Secondary | ICD-10-CM

## 2017-11-05 DIAGNOSIS — R0981 Nasal congestion: Secondary | ICD-10-CM | POA: Diagnosis not present

## 2017-11-05 DIAGNOSIS — R509 Fever, unspecified: Secondary | ICD-10-CM | POA: Diagnosis not present

## 2017-11-05 DIAGNOSIS — R82998 Other abnormal findings in urine: Secondary | ICD-10-CM | POA: Diagnosis not present

## 2017-11-05 DIAGNOSIS — J069 Acute upper respiratory infection, unspecified: Secondary | ICD-10-CM | POA: Diagnosis not present

## 2017-11-05 DIAGNOSIS — R05 Cough: Secondary | ICD-10-CM | POA: Diagnosis not present

## 2017-11-05 DIAGNOSIS — N10 Acute tubulo-interstitial nephritis: Secondary | ICD-10-CM | POA: Diagnosis not present

## 2017-11-05 DIAGNOSIS — R10A0 Flank pain, unspecified side: Secondary | ICD-10-CM

## 2017-11-05 DIAGNOSIS — R059 Cough, unspecified: Secondary | ICD-10-CM

## 2017-11-05 LAB — POCT CBC
GRANULOCYTE PERCENT: 82.5 % — AB (ref 37–80)
HEMATOCRIT: 43.9 % (ref 37.7–47.9)
HEMOGLOBIN: 15.1 g/dL (ref 12.2–16.2)
Lymph, poc: 1.9 (ref 0.6–3.4)
MCH, POC: 30.8 pg (ref 27–31.2)
MCHC: 34.5 g/dL (ref 31.8–35.4)
MCV: 89.2 fL (ref 80–97)
MID (cbc): 0.4 (ref 0–0.9)
MPV: 7.9 fL (ref 0–99.8)
POC GRANULOCYTE: 10.6 — AB (ref 2–6.9)
POC LYMPH PERCENT: 14.7 %L (ref 10–50)
POC MID %: 2.8 %M (ref 0–12)
Platelet Count, POC: 167 10*3/uL (ref 142–424)
RBC: 4.92 M/uL (ref 4.04–5.48)
RDW, POC: 12.6 %
WBC: 12.9 10*3/uL — AB (ref 4.6–10.2)

## 2017-11-05 LAB — POCT URINALYSIS DIP (MANUAL ENTRY)
BILIRUBIN UA: NEGATIVE mg/dL
Bilirubin, UA: NEGATIVE
Glucose, UA: NEGATIVE mg/dL
Nitrite, UA: POSITIVE — AB
PH UA: 6 (ref 5.0–8.0)
SPEC GRAV UA: 1.015 (ref 1.010–1.025)
Urobilinogen, UA: 1 E.U./dL

## 2017-11-05 LAB — POC MICROSCOPIC URINALYSIS (UMFC): Mucus: ABSENT

## 2017-11-05 LAB — POCT INFLUENZA A/B
Influenza A, POC: NEGATIVE
Influenza B, POC: NEGATIVE

## 2017-11-05 MED ORDER — IBUPROFEN 200 MG PO TABS
600.0000 mg | ORAL_TABLET | Freq: Once | ORAL | Status: AC
  Administered 2017-11-05: 600 mg via ORAL

## 2017-11-05 MED ORDER — CEFPODOXIME PROXETIL 200 MG PO TABS: 200.0000 mg | ORAL_TABLET | Freq: Two times a day (BID) | ORAL | 0 refills | Status: AC

## 2017-11-05 MED ORDER — CEFTRIAXONE SODIUM 1 G IJ SOLR
1.0000 g | Freq: Once | INTRAMUSCULAR | Status: AC
  Administered 2017-11-05: 1 g via INTRAMUSCULAR

## 2017-11-05 NOTE — Patient Instructions (Addendum)
Your results are consistent with a kidney infection. I have given you a prescription for an antibiotic, which will cover both kidney and lung bacterial etiology. Please take with food. I have sent off a urine culture and we should have those results in 48 hours. If your symptoms worsen while you are awaiting these results or you develop any worsening symptoms or new onset nausea and vomiting, please seek care immediately.   Please continue drinking lots of fluids. Follow up in 2 days for reevaluation. Thank you for letting me participate in your health and well being.     Pyelonephritis, Adult Pyelonephritis is a kidney infection. The kidneys are organs that help clean your blood by moving waste out of your blood and into your pee (urine). This infection can happen quickly, or it can last for a long time. In most cases, it clears up with treatment and does not cause other problems. Follow these instructions at home: Medicines  Take over-the-counter and prescription medicines only as told by your doctor.  Take your antibiotic medicine as told by your doctor. Do not stop taking the medicine even if you start to feel better. General instructions  Drink enough fluid to keep your pee clear or pale yellow.  Avoid caffeine, tea, and carbonated drinks.  Pee (urinate) often. Avoid holding in pee for long periods of time.  Pee before and after sex.  After pooping (having a bowel movement), women should wipe from front to back. Use each tissue only once.  Keep all follow-up visits as told by your doctor. This is important. Contact a doctor if:  You do not feel better after 2 days.  Your symptoms get worse.  You have a fever. Get help right away if:  You cannot take your medicine or drink fluids as told.  You have chills and shaking.  You throw up (vomit).  You have very bad pain in your side (flank) or back.  You feel very weak or you pass out (faint). This information is not  intended to replace advice given to you by your health care provider. Make sure you discuss any questions you have with your health care provider. Document Released: 10/05/2004 Document Revised: 02/03/2016 Document Reviewed: 12/21/2014 Elsevier Interactive Patient Education  2018 [employer]    IF you received an x-ray today, you will receive an invoice from [facility] Please contact [facility] at [phone] with questions or concerns regarding your invoice.   IF you received labwork today, you will receive an invoice from [facility]. Please contact [facility] at [phone] with questions or concerns regarding your invoice.   Our billing staff will not be able to assist you with questions regarding bills from these companies.  You will be contacted with the lab results as soon as they are available. The fastest way to get your results is to activate your My Chart account. Instructions are located on the last page of this paperwork. If you have not heard from us regarding the results in 2 weeks, please contact this office.

## 2017-11-05 NOTE — Progress Notes (Signed)
kub    MRN: [id number] DOB: 1961/01/09  Subjective:   Robin Chen is a 57 y.o. female presenting for chief complaint of Fever (X 2 days); Generalized Body Aches (X 3 days); and Cough (X 3 days) .  Reports sudden onset of fever, chills, body aches, nasal congestion, and dry cough x 2 days. Also having worsening right sided flank pain with associated suprapubic pressure. Flank pain does not radiate.  Has tried dayquil, nyquil, and ibuprofen with relief. Denies sore throat, wheezing, shortness of breath and chest pain, nausea, vomiting, abdominal pain and diarrhea. Has had sick contact with family members. Denies history of seasonal allergies, denies history of asthma or COPD. Patient has not had flu shot this season. Current smoker, smokes 0.5 ppd. Denies any other aggravating or relieving factors, no other questions or concerns. Has PMH of T2DM, UTIs, urosepsis, and kidney stones. Notes this does not feel like kidney stones.   Review of Systems  Gastrointestinal: Negative for blood in stool and constipation.  Genitourinary: Negative for dysuria, frequency, hematuria and urgency.  Neurological: Negative for dizziness, tingling and weakness.    Robin Chen has a current medication list which includes the following prescription(s): hydrocodone-acetaminophen, ibuprofen, lisinopril, metformin, ondansetron, and cefpodoxime. Also is allergic to penicillins and ciprofloxacin.  Robin Chen  has a past medical history of Anemia, Blood transfusion, Chronic kidney disease, COPD (chronic obstructive pulmonary disease) (HCC), Headache(784.0), and Hypertension. Also  has a past surgical history that includes Dilation and curettage of uterus and Cystoscopy w/ ureteral stent placement.   Objective:   Vitals: BP 120/78 (BP Location: Left Arm, Patient Position: Sitting, Cuff Size: Normal)   Pulse (!) 115   Temp 99.2 F (37.3 C)   Resp 18   Ht 5' 5.35" (1.66 m)   Wt 190 lb 12.8 oz (86.5 kg)   SpO2 98%   BMI  31.41 kg/m   Physical Exam  Constitutional: She is oriented to person, place, and time. She appears well-developed and well-nourished. No distress.  HENT:  Head: Normocephalic and atraumatic.  Right Ear: Tympanic membrane, external ear and ear canal normal.  Left Ear: Tympanic membrane, external ear and ear canal normal.  Nose: Mucosal edema present. Right sinus exhibits no maxillary sinus tenderness and no frontal sinus tenderness. Left sinus exhibits no maxillary sinus tenderness and no frontal sinus tenderness.  Mouth/Throat: Uvula is midline, oropharynx is clear and moist and mucous membranes are normal. No tonsillar exudate.  Eyes: Conjunctivae are normal.  Neck: Normal range of motion.  Cardiovascular: Regular rhythm and normal heart sounds. Tachycardia present.  Pulmonary/Chest: Effort normal and breath sounds normal. She has no wheezes. She has no rhonchi. She has no rales.  Abdominal: Soft. Normal appearance. There is tenderness in the suprapubic area. There is CVA tenderness (right sided). There is no rigidity, no rebound, no guarding, no tenderness at McBurney's point and negative Murphy's sign.  Lymphadenopathy:       Head (right side): No submental, no submandibular, no tonsillar, no preauricular, no posterior auricular and no occipital adenopathy present.       Head (left side): No submental, no submandibular, no tonsillar, no preauricular, no posterior auricular and no occipital adenopathy present.    She has no axillary adenopathy.       Right: No supraclavicular adenopathy present.       Left: No supraclavicular adenopathy present.  Neurological: She is alert and oriented to person, place, and time.  Skin: Skin is warm and dry.  Psychiatric: She has a  normal mood and affect.  Vitals reviewed.   Results for orders placed or performed in visit on 11/05/17 (from the past 24 hour(s))  POCT Influenza A/B     Status: Normal   Collection Time: 11/05/17  9:40 AM  Result Value  Ref Range   Influenza A, POC Negative Negative   Influenza B, POC Negative Negative  POCT CBC     Status: Abnormal   Collection Time: 11/05/17 10:02 AM  Result Value Ref Range   WBC 12.9 (A) 4.6 - 10.2 K/uL   Lymph, poc 1.9 0.6 - 3.4   POC LYMPH PERCENT 14.7 10 - 50 %L   MID (cbc) 0.4 0 - 0.9   POC MID % 2.8 0 - 12 %M   POC Granulocyte 10.6 (A) 2 - 6.9   Granulocyte percent 82.5 (A) 37 - 80 %G   RBC 4.92 4.04 - 5.48 M/uL   Hemoglobin 15.1 12.2 - 16.2 g/dL   HCT, POC 16.1 09.6 - 47.9 %   MCV 89.2 80 - 97 fL   MCH, POC 30.8 27 - 31.2 pg   MCHC 34.5 31.8 - 35.4 g/dL   RDW, POC 04.5 %   Platelet Count, POC 167 142 - 424 K/uL   MPV 7.9 0 - 99.8 fL  POCT urinalysis dipstick     Status: Abnormal   Collection Time: 11/05/17 10:14 AM  Result Value Ref Range   Color, UA yellow yellow   Clarity, UA cloudy (A) clear   Glucose, UA negative negative mg/dL   Bilirubin, UA negative negative   Ketones, POC UA negative negative mg/dL   Spec Grav, UA 4.098 1.191 - 1.025   Blood, UA moderate (A) negative   pH, UA 6.0 5.0 - 8.0   Protein Ur, POC =100 (A) negative mg/dL   Urobilinogen, UA 1.0 0.2 or 1.0 E.U./dL   Nitrite, UA Positive (A) Negative   Leukocytes, UA Moderate (2+) (A) Negative  POCT Microscopic Urinalysis ([facility])     Status: Abnormal   Collection Time: 11/05/17 10:54 AM  Result Value Ref Range   WBC,UR,HPF,POC Too numerous to count  (A) None WBC/hpf   RBC,UR,HPF,POC None None RBC/hpf   Bacteria Few (A) None, Too numerous to count   Mucus Absent Absent   Epithelial Cells, UR Per Microscopy None None, Too numerous to count cells/hpf   Dg Chest 2 View  Result Date: 11/05/2017 CLINICAL DATA:  Cough, fever for 3 days. EXAM: CHEST  2 VIEW COMPARISON:  01/08/2012 FINDINGS: The heart size and mediastinal contours are within normal limits. Both lungs are clear. The visualized skeletal structures are unremarkable. IMPRESSION: No active cardiopulmonary disease. Electronically Signed   By:  Elige Ko   On: 11/05/2017 10:31    Assessment and Plan :  This case was precepted with Dr. Katrinka Blazing.   1. Generalized body aches - POCT Influenza A/B 2. Fever, unspecified Resolved with ibuprofen.  - POCT CBC - ibuprofen (ADVIL,MOTRIN) tablet 600 mg 3. Nasal congestion 4. Cough CXR negative. Lungs CTAB.  - POCT CBC - DG Chest 2 View; Future 5. Acute upper respiratory infection 6. Flank pain - POCT CBC - POCT urinalysis dipstick - POCT Microscopic Urinalysis ([facility]) 7. Urine leukocytes - Urine Culture 8. Leukocytosis, unspecified type 9. Acute pyelonephritis Hx and PE findings consistent with 2 separate conditions. Concern for both URI and pyleonephritis. Nontoxic appearance. Pt with right sided flank pain, suprapubic pressure, mucosal edema, and cough. She is tachycardic in office. Fever resolved with ibuprofen.  WBC elevated at 12.9 with left shift. UA with moderate lueks and nitrites, no RBCs. CXR with no acute findings. Will treat with rocephin injection and oral cefpodoxime at this time. Has PCN allergy but has had cephalosporins in the past with no adverse rxn. Urine culture pending. Follow up in 2 days for reevaluation. Given strict ED precaution.  - cefTRIAXone (ROCEPHIN) injection 1 g - cefpodoxime (VANTIN) 200 MG tablet; Take 1 tablet (200 mg total) by mouth 2 (two) times daily for 5 days.  Dispense: 10 tablet; Refill: 0   Benjiman CoreBrittany Caretha Rumbaugh, PA-C  Primary Care at [facility] 11/05/2017 2:21 PM

## 2017-11-07 ENCOUNTER — Ambulatory Visit: Payer: PRIVATE HEALTH INSURANCE | Admitting: Physician Assistant

## 2017-11-07 ENCOUNTER — Encounter: Payer: Self-pay | Admitting: Physician Assistant

## 2017-11-07 ENCOUNTER — Other Ambulatory Visit: Payer: Self-pay

## 2017-11-07 VITALS — BP 131/83 | HR 98 | Temp 99.1°F | Resp 18 | Ht 64.96 in | Wt 195.4 lb

## 2017-11-07 DIAGNOSIS — G8929 Other chronic pain: Secondary | ICD-10-CM

## 2017-11-07 DIAGNOSIS — J069 Acute upper respiratory infection, unspecified: Secondary | ICD-10-CM | POA: Diagnosis not present

## 2017-11-07 DIAGNOSIS — N10 Acute tubulo-interstitial nephritis: Secondary | ICD-10-CM

## 2017-11-07 LAB — URINE CULTURE

## 2017-11-07 LAB — POCT URINALYSIS DIP (MANUAL ENTRY)
BILIRUBIN UA: NEGATIVE mg/dL
Bilirubin, UA: NEGATIVE
GLUCOSE UA: NEGATIVE mg/dL
Nitrite, UA: NEGATIVE
Protein Ur, POC: NEGATIVE mg/dL
Spec Grav, UA: <=1.005 — AB (ref 1.010–1.025)
Urobilinogen, UA: 0.2 E.U./dL
pH, UA: 6 (ref 5.0–8.0)

## 2017-11-07 LAB — POCT CBC
Granulocyte percent: 56.7 %G (ref 37–80)
HCT, POC: 44.4 % (ref 37.7–47.9)
HEMOGLOBIN: 14.8 g/dL (ref 12.2–16.2)
Lymph, poc: 3.3 (ref 0.6–3.4)
MCH: 30.2 pg (ref 27–31.2)
MCHC: 33.3 g/dL (ref 31.8–35.4)
MCV: 90.7 fL (ref 80–97)
MID (cbc): 0.4 (ref 0–0.9)
MPV: 7.9 fL (ref 0–99.8)
PLATELET COUNT, POC: 186 10*3/uL (ref 142–424)
POC Granulocyte: 4.8 (ref 2–6.9)
POC LYMPH PERCENT: 39 %L (ref 10–50)
POC MID %: 4.3 %M (ref 0–12)
RBC: 4.89 M/uL (ref 4.04–5.48)
RDW, POC: 12.8 %
WBC: 8.5 10*3/uL (ref 4.6–10.2)

## 2017-11-07 MED ORDER — IBUPROFEN 800 MG PO TABS
800.0000 mg | ORAL_TABLET | Freq: Three times a day (TID) | ORAL | 0 refills | Status: DC | PRN
Start: 2017-11-07 — End: 2018-02-26

## 2017-11-07 NOTE — Patient Instructions (Addendum)
  Complete antibiotic as prescribed. Return if any of your symptoms return after you complete the antibiotic or if anything worsens.   Plan to follow up with me on Saturday, 12/08/17 for diabetes follow up.

## 2017-11-07 NOTE — Progress Notes (Signed)
Cledith Kamiya  MRN: [id number] DOB: [birth year]  Subjective:  Robin Chen is a 57 y.o. female seen in office today for a chief complaint of follow up on URI and pyelonephritis. Last seen on 11/05/17. Given injection of rocephin and started on oral cefpodoxime. Instructed to follow up in 2 days. Please review that note for additional details. Today, pt notes she feels much better. The right sided flank pain, fever, chills, suprapubic pressure, and cough have resolved. Still having a little bit of right sided low back pain. Denies dysuria, urinary frequency, nausea, vomiting, SOB, chest pain, and diarrhea. Has taken abx as prescribed. Needs refill for ibuprofen. Uses it occasionally as needed for pain.   Review of Systems  Constitutional: Negative for appetite change and diaphoresis.  Neurological: Negative for dizziness and light-headedness.    Patient Active Problem List   Diagnosis Date Noted  . Type 2 diabetes mellitus without complication, without long-term current use of insulin (HCC) 05/15/2017  . Fatty infiltration of liver 02/27/2017  . Urosepsis 01/09/2012  . Renal stone 01/09/2012  . Thrombocytopenia (HCC) 01/09/2012  . Acute renal insufficiency 01/09/2012    Current Outpatient Medications on File Prior to Visit  Medication Sig Dispense Refill  . cefpodoxime (VANTIN) 200 MG tablet Take 1 tablet (200 mg total) by mouth 2 (two) times daily for 5 days. 10 tablet 0  . HYDROcodone-acetaminophen (NORCO/VICODIN) 5-325 MG tablet Take 1 tablet by mouth every 4 (four) hours as needed. 15 tablet 0  . ibuprofen (IBU) 800 MG tablet Take 1 tablet (800 mg total) by mouth every 6 (six) hours as needed for moderate pain. 30 tablet 0  . lisinopril (PRINIVIL,ZESTRIL) 5 MG tablet Take 0.5 tablets (2.5 mg total) by mouth daily. 90 tablet 1  . metFORMIN (GLUCOPHAGE) 500 MG tablet Take 1 tablet (500 mg total) by mouth 2 (two) times daily with a meal. (Patient taking differently: Take 500 mg by mouth  daily with breakfast. ) 60 tablet 3  . ondansetron (ZOFRAN ODT) 4 MG disintegrating tablet Take 1 tablet (4 mg total) by mouth every 8 (eight) hours as needed for nausea. 10 tablet 0   No current facility-administered medications on file prior to visit.     Allergies  Allergen Reactions  . Penicillins Rash and Other (See Comments)    Has patient had a PCN reaction causing immediate rash, facial/tongue/throat swelling, SOB or lightheadedness with hypotension: No Has patient had a PCN reaction causing severe rash involving mucus membranes or skin necrosis: No Has patient had a PCN reaction that required hospitalization No Has patient had a PCN reaction occurring within the last 10 years: No If all of the above answers are "NO", then may proceed with Cephalosporin use.  . Ciprofloxacin Rash     Objective:  BP 131/83 (BP Location: Left Arm, Patient Position: Sitting, Cuff Size: Large)   Pulse 98   Temp 99.1 F (37.3 C) (Oral)   Resp 18   Ht 5' 4.96" (1.65 m)   Wt 195 lb 6.4 oz (88.6 kg)   SpO2 97%   BMI 32.56 kg/m   Physical Exam  Constitutional: She is oriented to person, place, and time and well-developed, well-nourished, and in no distress.  Sitting on exam table.   HENT:  Head: Normocephalic and atraumatic.  Eyes: Conjunctivae are normal.  Neck: Normal range of motion.  Cardiovascular: Normal rate, regular rhythm and normal heart sounds.  Pulmonary/Chest: Effort normal.  Abdominal: Soft. Normal appearance and bowel sounds are normal. There  is no tenderness. There is no CVA tenderness.  Neurological: She is alert and oriented to person, place, and time. Gait normal.  Skin: Skin is warm and dry.  Psychiatric: Affect normal.  Vitals reviewed.    Results for orders placed or performed in visit on 11/07/17 (from the past 24 hour(s))  POCT urinalysis dipstick     Status: Abnormal   Collection Time: 11/07/17  4:16 PM  Result Value Ref Range   Color, UA yellow yellow    Clarity, UA clear clear   Glucose, UA negative negative mg/dL   Bilirubin, UA negative negative   Ketones, POC UA negative negative mg/dL   Spec Grav, UA <=1.610<=1.005 (A) 1.010 - 1.025   Blood, UA trace-intact (A) negative   pH, UA 6.0 5.0 - 8.0   Protein Ur, POC negative negative mg/dL   Urobilinogen, UA 0.2 0.2 or 1.0 E.U./dL   Nitrite, UA Negative Negative   Leukocytes, UA Trace (A) Negative  POCT CBC     Status: None   Collection Time: 11/07/17  4:24 PM  Result Value Ref Range   WBC 8.5 4.6 - 10.2 K/uL   Lymph, poc 3.3 0.6 - 3.4   POC LYMPH PERCENT 39.0 10 - 50 %L   MID (cbc) 0.4 0 - 0.9   POC MID % 4.3 0 - 12 %M   POC Granulocyte 4.8 2 - 6.9   Granulocyte percent 56.7 37 - 80 %G   RBC 4.89 4.04 - 5.48 M/uL   Hemoglobin 14.8 12.2 - 16.2 g/dL   HCT, POC 96.044.4 45.437.7 - 47.9 %   MCV 90.7 80 - 97 fL   MCH, POC 30.2 27 - 31.2 pg   MCHC 33.3 31.8 - 35.4 g/dL   RDW, POC 09.812.8 %   Platelet Count, POC 186 142 - 424 K/uL   MPV 7.9 0 - 99.8 fL    Assessment and Plan :  1. Acute pyelonephritis Improving. CVA tenderness and suprapubic pressure resolved. Pt is overall well appearing today, no distress. Appears like she feels much better. Vitals stable. UA improved. WBC decreased from 12.9 to 8.5. Urine culture with E.coli, which was sensitive to abx. Recommend she complete abx course. Return as needed.  - POCT urinalysis dipstick - POCT CBC  2. Acute upper respiratory infection Resolving.   3. Other chronic pain Last BMP 2 months ago with normal creatinine. Follow up in one month for diabetes follow up.  - ibuprofen (IBU) 800 MG tablet; Take 1 tablet (800 mg total) by mouth every 8 (eight) hours as needed for moderate pain.  Dispense: 30 tablet; Refill: 0  Benjiman CoreBrittany Wiseman PA-C  [facility] at [facility]  [facility] 11/07/2017 4:26 PM

## 2017-12-08 ENCOUNTER — Encounter: Payer: Self-pay | Admitting: Physician Assistant

## 2017-12-08 ENCOUNTER — Ambulatory Visit: Payer: PRIVATE HEALTH INSURANCE | Admitting: Physician Assistant

## 2017-12-08 ENCOUNTER — Other Ambulatory Visit: Payer: Self-pay

## 2017-12-08 VITALS — BP 122/75 | HR 110 | Temp 98.6°F | Resp 16 | Ht 64.25 in | Wt 193.2 lb

## 2017-12-08 DIAGNOSIS — Z124 Encounter for screening for malignant neoplasm of cervix: Secondary | ICD-10-CM

## 2017-12-08 DIAGNOSIS — E669 Obesity, unspecified: Secondary | ICD-10-CM

## 2017-12-08 DIAGNOSIS — Z6832 Body mass index (BMI) 32.0-32.9, adult: Secondary | ICD-10-CM | POA: Diagnosis not present

## 2017-12-08 DIAGNOSIS — Z720 Tobacco use: Secondary | ICD-10-CM | POA: Diagnosis not present

## 2017-12-08 DIAGNOSIS — E119 Type 2 diabetes mellitus without complications: Secondary | ICD-10-CM

## 2017-12-08 DIAGNOSIS — Z114 Encounter for screening for human immunodeficiency virus [HIV]: Secondary | ICD-10-CM | POA: Diagnosis not present

## 2017-12-08 LAB — POCT GLYCOSYLATED HEMOGLOBIN (HGB A1C): Hemoglobin A1C: 6.3

## 2017-12-08 MED ORDER — ZOSTER VAC RECOMB ADJUVANTED 50 MCG/0.5ML IM SUSR: 0.5000 mL | Freq: Once | INTRAMUSCULAR | 0 refills | Status: AC

## 2017-12-08 MED ORDER — METFORMIN HCL 500 MG PO TABS: 500.0000 mg | ORAL_TABLET | Freq: Two times a day (BID) | ORAL | 1 refills | Status: DC

## 2017-12-08 NOTE — Progress Notes (Signed)
MRN: [id number]  Subjective:   Robin Chen is a 57 y.o. female who presents for follow up of Type 2 diabetes mellitus.   Diagnosis was made in 2018. Patient is currently managed with Continued metformin 500 BID which has been effective. Admits excellent compliance. Denies adverse effects including metallic taste, hypoglycemia, nausea, vomiting.   Patient is checking home blood sugars. Home blood sugar records: BGs range between 117 and 135. Current symptoms include none. Patient denies foot ulcerations, increased appetite, nausea, paresthesia of the feet, polydipsia, polyuria, visual disturbances and vomiting. Patient is checking their feet daily. No foot concerns. Last diabetic eye exam eye exam 04/2017.   Diet: Tries to avoid breads and pastas. Eats lots of fruits and meat. Does like sweets. Drinks at least 60 oz of water a day. Exercise includes walking occasionally but "not too much." Smokes 1 ppd x 30 years.   Known diabetic complications: none  Immunizations: Flu vaccine: declines, shingles: , pneumococal vaccine 02/2017  Other concerns: Needs pap smear. Cannot remember the last time she had one. Seh is postmenopaual.  ROS Per HPI  Past Medical History:  Diagnosis Date  . Anemia   . Blood transfusion    2003  . Chronic kidney disease    Rt UPJ stone  . COPD (chronic obstructive pulmonary disease) ([place])   . Headache(784.0)   . Hypertension      Social History   Socioeconomic History  . Marital status: Married    Spouse name: Miralem  . Number of children: 3  . Years of education: Not on file  . Highest education level: Not on file  Occupational History  . Not on file  Social Needs  . Financial resource strain: Not on file  . Food insecurity:    Worry: Not on file    Inability: Not on file  . Transportation needs:    Medical: Not on file    Non-medical: Not on file  Tobacco Use  . Smoking status: Current Every Day Smoker    Packs/day: 1.00    Years:  30.00    Pack years: 30.00    Types: Cigarettes  . Smokeless tobacco: Never Used  Substance and Sexual Activity  . Alcohol use: No  . Drug use: No  . Sexual activity: Yes  Lifestyle  . Physical activity:    Days per week: Not on file    Minutes per session: Not on file  . Stress: Not on file  Relationships  . Social connections:    Talks on phone: Not on file    Gets together: Not on file    Attends religious service: Not on file    Active member of club or organization: Not on file    Attends meetings of clubs or organizations: Not on file    Relationship status: Not on file  . Intimate partner violence:    Fear of current or ex partner: Not on file    Emotionally abused: Not on file    Physically abused: Not on file    Forced sexual activity: Not on file  Other Topics Concern  . Not on file  Social History Narrative  . Not on file      Objective:   PHYSICAL EXAM BP 122/75 (BP Location: Right Arm)   Pulse (!) 110   Temp 98.6 F (37 C) (Oral)   Resp 16   Ht 5' 4.25" (1.632 m)   Wt 193 lb 3.2 oz (87.6 kg)   SpO2  97%   BMI 32.91 kg/m   Physical Exam  Constitutional: She is oriented to person, place, and time. She appears well-developed and well-nourished.  HENT:  Head: Normocephalic and atraumatic.  Eyes: Conjunctivae are normal.  Neck: Normal range of motion.  Cardiovascular: Normal rate, regular rhythm and normal heart sounds.  Pulmonary/Chest: Effort normal and breath sounds normal. She has no decreased breath sounds. She has no wheezes. She has no rhonchi. She has no rales.  Genitourinary: Vagina normal and uterus normal. Cervix exhibits no motion tenderness. Right adnexum displays no mass and no tenderness. Left adnexum displays no mass and no tenderness.  Neurological: She is alert and oriented to person, place, and time.  Skin: Skin is warm and dry.  Psychiatric: She has a normal mood and affect.  Vitals reviewed.   Results for orders placed or  performed in visit on 12/08/17 (from the past 24 hour(s))  POCT glycosylated hemoglobin (Hb A1C)     Status: None   Collection Time: 12/08/17  8:36 AM  Result Value Ref Range   Hemoglobin A1C 6.3    Wt Readings from Last 3 Encounters:  12/08/17 193 lb 3.2 oz (87.6 kg)  11/07/17 195 lb 6.4 oz (88.6 kg)  11/05/17 190 lb 12.8 oz (86.5 kg)    Assessment and Plan :  1. Type 2 diabetes mellitus without complication, without long-term current use of insulin (HCC) A1C of 6.3. Improved since last visit. Recommend continuing with medication as prescribed. Pt was given lifestyle modification goals to strive for before next visit such as: focusing on low carb, high protein and vegetable diet, increasing amount walking per week by 10-20 minutes, and eliminating 2-3 cigarettes per day for her normal routine. Pt is motivated to make these changes. Labs pending. Refills provided. Follow up in 6 months.  - CMP14+EGFR - Lipid panel - POCT glycosylated hemoglobin (Hb A1C) - Microalbumin/Creatinine Ratio, Urine - metFORMIN (GLUCOPHAGE) 500 MG tablet; Take 1 tablet (500 mg total) by mouth 2 (two) times daily with a meal.  Dispense: 180 tablet; Refill: 1  2. Tobacco use Continues to smoke 1 ppd. Discussed smoking cessation. Pt is not ready to quit at this time.   3. Class 1 obesity without serious comorbidity with body mass index (BMI) of 32.0 to 32.9 in adult, unspecified obesity type Weight remains stable in 190s. Discussed weight loss goals. See above.   4. Screening for HIV (human immunodeficiency virus) - HIV antibody  5. Screening for cervical cancer - Pap IG and HPV (high risk) DNA detection  Tenna Delaine, PA-C  [facility] 12/08/2017 8:50 AM

## 2017-12-08 NOTE — Patient Instructions (Addendum)
Keep trying to focus on a low carb, high protein and vegetable diet. Increase the amount you walk by at least 10-20 minutes per week. Cut down by a few cigarettes each day. Follow up in 6 months for reevaluation. Thank you for letting me participate in your health and well being.   Smoking Tobacco Information Smoking tobacco will very likely harm your health. Tobacco contains a poisonous (toxic), colorless chemical called nicotine. Nicotine affects the brain and makes tobacco addictive. This change in your brain can make it hard to stop smoking. Tobacco also has other toxic chemicals that can hurt your body and raise your risk of many cancers. How can smoking tobacco affect me? Smoking tobacco can increase your chances of having serious health conditions, such as:  Cancer. Smoking is most commonly associated with lung cancer, but can lead to cancer in other parts of the body.  Chronic obstructive pulmonary disease (COPD). This is a long-term lung condition that makes it hard to breathe. It also gets worse over time.  High blood pressure (hypertension), heart disease, stroke, or heart attack.  Lung infections, such as pneumonia.  Cataracts. This is when the lenses in the eyes become clouded.  Digestive problems. This may include peptic ulcers, heartburn, and gastroesophageal reflux disease (GERD).  Oral health problems, such as gum disease and tooth loss.  Loss of taste and smell.  Smoking can affect your appearance by causing:  Wrinkles.  Yellow or stained teeth, fingers, and fingernails.  Smoking tobacco can also affect your social life.  Many workplaces, [employer], hotels, and public places are tobacco-free. This means that you may experience challenges in finding places to smoke when away from home.  The cost of a smoking habit can be expensive. Expenses for someone who smokes come in two ways: ? You spend money on a regular basis to buy tobacco. ? Your health care costs in  the long-term are higher if you smoke.  Tobacco smoke can also affect the health of those around you. Children of smokers have greater chances of: ? Sudden infant death syndrome (SIDS). ? Ear infections. ? Lung infections.  What lifestyle changes can be made?  Do not start smoking. Quit if you already do.  To quit smoking: ? Make a plan to quit smoking and commit yourself to it. Look for programs to help you and ask your health care provider for recommendations and ideas. ? Talk with your health care provider about using nicotine replacement medicines to help you quit. Medicine replacement medicines include gum, lozenges, patches, sprays, or pills. ? Do not replace cigarette smoking with electronic cigarettes, which are commonly called e-cigarettes. The safety of e-cigarettes is not known, and some may contain harmful chemicals. ? Avoid places, people, or situations that tempt you to smoke. ? If you try to quit but return to smoking, don't give up hope. It is very common for people to try a number of times before they fully succeed. When you feel ready again, give it another try.  Quitting smoking might affect the way you eat as well as your weight. Be prepared to monitor your eating habits. Get support in planning and following a healthy diet.  Ask your health care provider about having regular tests (screenings) to check for cancer. This may include blood tests, imaging tests, and other tests.  Exercise regularly. Consider taking walks, joining a gym, or doing yoga or exercise classes.  Develop skills to manage your stress. These skills include meditation. What are the  benefits of quitting smoking? By quitting smoking, you may:  Lower your risk of getting cancer and other diseases caused by smoking.  Live longer.  Breathe better.  Lower your blood pressure and heart rate.  Stop your addiction to tobacco.  Stop creating secondhand smoke that hurts other people.  Improve your  sense of taste and smell.  Look better over time, due to having fewer wrinkles and less staining.  What can happen if changes are not made? If you do not stop smoking, you may:  Get cancer and other diseases.  Develop COPD or other long-term (chronic) lung conditions.  Develop serious problems with your heart and blood vessels (cardiovascular system).  Need more tests to screen for problems caused by smoking.  Have higher, long-term healthcare costs from medicines or treatments related to smoking.  Continue to have worsening changes in your lungs, mouth, and nose.  Where to find support: To get support to quit smoking, consider:  Asking your health care provider for more information and resources.  Taking classes to learn more about quitting smoking.  Looking for local organizations that offer resources about quitting smoking.  Joining a support group for people who want to quit smoking in your local community.  Where to find more information: You may find more information about quitting smoking from:  HelpGuide.org: www.helpguide.org/articles/addictions/how-to-quit-smoking.htm  BankRights.uySmokefree.gov: smokefree.gov  American Lung Association: www.lung.org  Contact a health care provider if:  You have problems breathing.  Your lips, nose, or fingers turn blue.  You have chest pain.  You are coughing up blood.  You feel faint or you pass out.  You have other noticeable changes that cause you to worry. Summary  Smoking tobacco can negatively affect your health, the health of those around you, your finances, and your social life.  Do not start smoking. Quit if you already do. If you need help quitting, ask your health care provider.  Think about joining a support group for people who want to quit smoking in your local community. There are many effective programs that will help you to quit this behavior. This information is not intended to replace advice given to you by  your health care provider. Make sure you discuss any questions you have with your health care provider. Document Released: 09/12/2016 Document Revised: 09/12/2016 Document Reviewed: 09/12/2016 Elsevier Interactive Patient Education  2018 [employer]    Steps to Quit Smoking Smoking tobacco can be bad for your health. It can also affect almost every organ in your body. Smoking puts you and people around you at risk for many serious long-lasting (chronic) diseases. Quitting smoking is hard, but it is one of the best things that you can do for your health. It is never too late to quit. What are the benefits of quitting smoking? When you quit smoking, you lower your risk for getting serious diseases and conditions. They can include:  Lung cancer or lung disease.  Heart disease.  Stroke.  Heart attack.  Not being able to have children (infertility).  Weak bones (osteoporosis) and broken bones (fractures).  If you have coughing, wheezing, and shortness of breath, those symptoms may get better when you quit. You may also get sick less often. If you are pregnant, quitting smoking can help to lower your chances of having a baby of low birth weight. What can I do to help me quit smoking? Talk with your doctor about what can help you quit smoking. Some things you can do (strategies) include:  Quitting smoking totally, instead of slowly cutting back how much you smoke over a period of time.  Going to in-person counseling. You are more likely to quit if you go to many counseling sessions.  Using resources and support systems, such as: ? Agricultural engineer with a Veterinary surgeon. ? Phone quitlines. ? Automotive engineer. ? Support groups or group counseling. ? Text messaging programs. ? Mobile phone apps or applications.  Taking medicines. Some of these medicines may have nicotine in them. If you are pregnant or breastfeeding, do not take any medicines to quit smoking unless your doctor says  it is okay. Talk with your doctor about counseling or other things that can help you.  Talk with your doctor about using more than one strategy at the same time, such as taking medicines while you are also going to in-person counseling. This can help make quitting easier. What things can I do to make it easier to quit? Quitting smoking might feel very hard at first, but there is a lot that you can do to make it easier. Take these steps:  Talk to your family and friends. Ask them to support and encourage you.  Call phone quitlines, reach out to support groups, or work with a Veterinary surgeon.  Ask people who smoke to not smoke around you.  Avoid places that make you want (trigger) to smoke, such as: ? Bars. ? Parties. ? Smoke-break areas at work.  Spend time with people who do not smoke.  Lower the stress in your life. Stress can make you want to smoke. Try these things to help your stress: ? Getting regular exercise. ? Deep-breathing exercises. ? Yoga. ? Meditating. ? Doing a body scan. To do this, close your eyes, focus on one area of your body at a time from head to toe, and notice which parts of your body are tense. Try to relax the muscles in those areas.  Download or buy apps on your mobile phone or tablet that can help you stick to your quit plan. There are many free apps, such as QuitGuide from the [employer] developer for Disease Control and Prevention). You can find more support from smokefree.gov and other websites.  This information is not intended to replace advice given to you by your health care provider. Make sure you discuss any questions you have with your health care provider. Document Released: 06/24/2009 Document Revised: 04/25/2016 Document Reviewed: 01/12/2015 Elsevier Interactive Patient Education  2018 [employer].  IF you received an x-ray today, you will receive an invoice from [facility]. Please contact [facility] at [phone] with questions or  concerns regarding your invoice.   IF you received labwork today, you will receive an invoice from [facility]. Please contact [facility] at [phone] with questions or concerns regarding your invoice.   Our billing staff will not be able to assist you with questions regarding bills from these companies.  You will be contacted with the lab results as soon as they are available. The fastest way to get your results is to activate your My Chart account. Instructions are located on the last page of this paperwork. If you have not heard from [place] regarding the results in 2 weeks, please contact this office.

## 2017-12-09 LAB — CMP14+EGFR
ALK PHOS: 102 IU/L (ref 39–117)
ALT: 32 IU/L (ref 0–32)
AST: 28 IU/L (ref 0–40)
Albumin/Globulin Ratio: 2 (ref 1.2–2.2)
Albumin: 4.7 g/dL (ref 3.5–5.5)
BUN/Creatinine Ratio: 29 — ABNORMAL HIGH (ref 9–23)
BUN: 19 mg/dL (ref 6–24)
Bilirubin Total: 0.4 mg/dL (ref 0.0–1.2)
CO2: 20 mmol/L (ref 20–29)
CREATININE: 0.65 mg/dL (ref 0.57–1.00)
Calcium: 9.3 mg/dL (ref 8.7–10.2)
Chloride: 106 mmol/L (ref 96–106)
GFR calc Af Amer: 114 mL/min/{1.73_m2} (ref >59)
GFR calc non Af Amer: 99 mL/min/{1.73_m2} (ref >59)
Globulin, Total: 2.4 g/dL (ref 1.5–4.5)
Glucose: 141 mg/dL — ABNORMAL HIGH (ref 65–99)
POTASSIUM: 4.4 mmol/L (ref 3.5–5.2)
Sodium: 141 mmol/L (ref 134–144)
Total Protein: 7.1 g/dL (ref 6.0–8.5)

## 2017-12-09 LAB — LIPID PANEL
CHOLESTEROL TOTAL: 219 mg/dL — AB (ref 100–199)
Chol/HDL Ratio: 4.8 ratio — ABNORMAL HIGH (ref 0.0–4.4)
HDL: 46 mg/dL (ref >39)
LDL CALC: 113 mg/dL — AB (ref 0–99)
TRIGLYCERIDES: 299 mg/dL — AB (ref 0–149)
VLDL Cholesterol Cal: 60 mg/dL — ABNORMAL HIGH (ref 5–40)

## 2017-12-09 LAB — MICROALBUMIN / CREATININE URINE RATIO
Creatinine, Urine: 61.9 mg/dL
Microalbumin, Urine: <3 ug/mL

## 2017-12-09 LAB — HIV ANTIBODY (ROUTINE TESTING W REFLEX): HIV SCREEN 4TH GENERATION: NONREACTIVE

## 2017-12-12 LAB — PAP IG AND HPV HIGH-RISK
HPV, high-risk: NEGATIVE
PAP SMEAR COMMENT: 0

## 2017-12-18 ENCOUNTER — Other Ambulatory Visit: Payer: Self-pay | Admitting: Physician Assistant

## 2017-12-18 MED ORDER — ATORVASTATIN CALCIUM 20 MG PO TABS: 20.0000 mg | ORAL_TABLET | Freq: Every day | ORAL | 3 refills | Status: DC

## 2017-12-18 NOTE — Progress Notes (Signed)
Meds ordered this encounter  Medications  . atorvastatin (LIPITOR) 20 MG tablet    Sig: Take 1 tablet (20 mg total) by mouth daily.    Dispense:  90 tablet    Refill:  3    Order Specific Question:   Supervising Provider    Answer:   SMITH, KRISTI M [[id number]]    

## 2018-02-18 ENCOUNTER — Ambulatory Visit: Payer: Self-pay | Admitting: *Deleted

## 2018-02-18 VITALS — BP 112/78 | HR 116 | Temp 98.6°F

## 2018-02-18 DIAGNOSIS — E11649 Type 2 diabetes mellitus with hypoglycemia without coma: Secondary | ICD-10-CM

## 2018-02-18 NOTE — Progress Notes (Signed)
1340: Pt ambulatory to clinic c/o n/v, dizziness, weakness. Pt appears pale, diaphoretic. Reports feeling well while eating lunch approx 20min pta. Went outside to smoke. Returned to workstation and noted nausea, weakness, just not feeling like herself. Went to restroom, self induced vomiting to try to relieve nausea. Small amt of emesis x1. She reports only liquid. Became dizzy, started sweating. Denies chest/arm/jaw pain, HA. Only pain is epigastric which she sts started with vomiting and is gradually improving.   Pt given 2 Pepto-Bismal chewables at 1340 for nausea. Hx DM2. Took 500mg  Metformin this am, 500mg  last pm as per Rx'd dosing. Ate nutrigrain bar for breakfast which is normal for her. For lunch she had small half portion of a pepperoni calzone that she shared with her husband but did not finish it. Then had greek yogurt and banana. Normally brings lunch from home of sandwich, side, drink.   CBG 47 at 1351. 1oz tube Instaglucose (24g sugar) administered. Pt tolerated well.   Pt feeling better approx 3-4 minutes after glucose adm. Diaphoresis and tachycardia persists.   CBG rechecked at 1359 at 73. Feeling much better now. Almost returned to her baseline. Slight nausea. Diaphoresis has resolved. Feels well to leave clinic. Going to find more sustaining food to keep cbg up. RN explained that instaglucose is temporary and body needs more fuel. She reports she has cbg meter with her at workstation and will eat something, check back in with RN in appox 30min and recheck cbg in approx one hour (RN will be out of office at this time.) Pt given ED precautions. Advised to f/u with pcp if similar episode recurs to discuss if meds need to be adjusted. She reports cbg at home usually 120-135 fasting. No Hx hypoglycemia before today. Does not believe she could have doubled up on meds accidentally today. Verbalizes understanding and plan of care for remainder of day and f/u with pcp/ED if needed.    1430: Pt  and son return to clinic. Pt reports feeling well, at her baseline. No longer pale. No diaphoresis noted. Pulse 116. She reports pulse is always high when at a doctors office. Per chart review, typically 98-115. Pt has tomorrow off work. Plans to rest at home. Son anxious for pt's condition. RN reviewed s/sx hypoglycemia with son. No changes to routine necessary at this time except to monitor for those s/sx. Pt and son agreeable to this and ambulatory from clinic. No further questions/concerns.

## 2018-02-26 ENCOUNTER — Ambulatory Visit: Payer: Self-pay | Admitting: Registered Nurse

## 2018-02-26 VITALS — BP 118/75 | HR 81 | Temp 98.2°F

## 2018-02-26 DIAGNOSIS — S39012A Strain of muscle, fascia and tendon of lower back, initial encounter: Secondary | ICD-10-CM

## 2018-02-26 DIAGNOSIS — R109 Unspecified abdominal pain: Secondary | ICD-10-CM

## 2018-02-26 LAB — POCT URINALYSIS DIPSTICK
BILIRUBIN UA: NEGATIVE
GLUCOSE UA: NEGATIVE
Ketones, POC UA: NEGATIVE mg/dL
Leukocytes, UA: NEGATIVE
Nitrite, UA: NEGATIVE
Protein, UA: NEGATIVE
RBC UA: NEGATIVE
SPEC GRAV UA: 1.01 (ref 1.010–1.025)
Urobilinogen, UA: 0.2 E.U./dL
pH, UA: 6.5 (ref 5.0–8.0)

## 2018-02-26 NOTE — Patient Instructions (Addendum)
Dietary Guidelines to Help Prevent Kidney Stones Kidney stones are deposits of minerals and salts that form inside your kidneys. Your risk of developing kidney stones may be greater depending on your diet, your lifestyle, the medicines you take, and whether you have certain medical conditions. Most people can reduce their chances of developing kidney stones by following the instructions below. Depending on your overall health and the type of kidney stones you tend to develop, your dietitian may give you more specific instructions. What are tips for following this plan? Reading food labels  Choose foods with "no salt added" or "low-salt" labels. Limit your sodium intake to less than 1500 mg per day.  Choose foods with calcium for each meal and snack. Try to eat about 300 mg of calcium at each meal. Foods that contain 200-500 mg of calcium per serving include: ? 8 oz (237 ml) of milk, fortified nondairy milk, and fortified fruit juice. ? 8 oz (237 ml) of kefir, yogurt, and soy yogurt. ? 4 oz (118 ml) of tofu. ? 1 oz of cheese. ? 1 cup (300 g) of dried figs. ? 1 cup (91 g) of cooked broccoli. ? 1-3 oz can of sardines or mackerel.  Most people need 1000 to 1500 mg of calcium each day. Talk to your dietitian about how much calcium is recommended for you. Shopping  Buy plenty of fresh fruits and vegetables. Most people do not need to avoid fruits and vegetables, even if they contain nutrients that may contribute to kidney stones.  When shopping for convenience foods, choose: ? Whole pieces of fruit. ? Premade salads with dressing on the side. ? Low-fat fruit and yogurt smoothies.  Avoid buying frozen meals or prepared deli foods.  Look for foods with live cultures, such as yogurt and kefir. Cooking  Do not add salt to food when cooking. Place a salt shaker on the table and allow each person to add his or her own salt to taste.  Use vegetable protein, such as beans, textured vegetable  protein (TVP), or tofu instead of meat in pasta, casseroles, and soups. Meal planning  Eat less salt, if told by your dietitian. To do this: ? Avoid eating processed or premade food. ? Avoid eating fast food.  Eat less animal protein, including cheese, meat, poultry, or fish, if told by your dietitian. To do this: ? Limit the number of times you have meat, poultry, fish, or cheese each week. Eat a diet free of meat at least 2 days a week. ? Eat only one serving each day of meat, poultry, fish, or seafood. ? When you prepare animal protein, cut pieces into small portion sizes. For most meat and fish, one serving is about the size of one deck of cards.  Eat at least 5 servings of fresh fruits and vegetables each day. To do this: ? Keep fruits and vegetables on hand for snacks. ? Eat 1 piece of fruit or a handful of berries with breakfast. ? Have a salad and fruit at lunch. ? Have two kinds of vegetables at dinner.  Limit foods that are high in a substance called oxalate. These include: ? Spinach. ? Rhubarb. ? Beets. ? Potato chips and french fries. ? Nuts.  If you regularly take a diuretic medicine, make sure to eat at least 1-2 fruits or vegetables high in potassium each day. These include: ? Avocado. ? Banana. ? [place], prune, carrot, or tomato juice. ? Baked potato. ? Cabbage. ? Beans and split peas.   General instructions  Drink enough fluid to keep your urine clear or pale yellow. This is the most important thing you can do.  Talk to your health care provider and dietitian about taking daily supplements. Depending on your health and the cause of your kidney stones, you may be advised: ? Not to take supplements with vitamin C. ? To take a calcium supplement. ? To take a daily probiotic supplement. ? To take other supplements such as magnesium, fish oil, or vitamin B6.  Take all medicines and supplements as told by your health care provider.  Limit alcohol intake to no more  than 1 drink a day for nonpregnant women and 2 drinks a day for men. One drink equals 12 oz of beer, 5 oz of wine, or 1 oz of hard liquor.  Lose weight if told by your health care provider. Work with your dietitian to find strategies and an eating plan that works best for you. What foods are not recommended? Limit your intake of the following foods, or as told by your dietitian. Talk to your dietitian about specific foods you should avoid based on the type of kidney stones and your overall health. Grains Breads. Bagels. Rolls. Baked goods. Salted crackers. Cereal. Pasta. Vegetables Spinach. Rhubarb. Beets. Canned vegetables. Angie Fava. Olives. Meats and other protein foods Nuts. Nut butters. Large portions of meat, poultry, or fish. Salted or cured meats. Deli meats. Hot dogs. Sausages. Dairy Cheese. Beverages Regular soft drinks. Regular vegetable juice. Seasonings and other foods Seasoning blends with salt. Salad dressings. Canned soups. Soy sauce. Ketchup. Barbecue sauce. Canned pasta sauce. Casseroles. Pizza. Lasagna. Frozen meals. Potato chips. [place] fries. Summary  You can reduce your risk of kidney stones by making changes to your diet.  The most important thing you can do is drink enough fluid. You should drink enough fluid to keep your urine clear or pale yellow.  Ask your health care provider or dietitian how much protein from animal sources you should eat each day, and also how much salt and calcium you should have each day. This information is not intended to replace advice given to you by your health care provider. Make sure you discuss any questions you have with your health care provider. Document Released: 12/23/2010 Document Revised: 08/08/2016 Document Reviewed: 08/08/2016 Elsevier Interactive Patient Education  2018 [employer]. Kidney Stones Kidney stones (urolithiasis) are solid, rock-like deposits that form inside of the organs that make urine (kidneys). A kidney  stone may form in a kidney and move into the bladder, where it can cause intense pain and block the flow of urine. Kidney stones are created when high levels of certain minerals are found in the urine. They are usually passed through urination, but in some cases, medical treatment may be needed to remove them. What are the causes? Kidney stones may be caused by:  A condition in which certain glands produce too much parathyroid hormone (primary hyperparathyroidism), which causes too much calcium buildup in the blood.  Buildup of uric acid crystals in the bladder (hyperuricosuria). Uric acid is a chemical that the body produces when you eat certain foods. It usually exits the body in the urine.  Narrowing (stricture) of one or both of the tubes that drain urine from the kidneys to the bladder (ureters).  A kidney blockage that is present at birth (congenital obstruction).  Past surgery on the kidney or the ureters, such as gastric bypass surgery.  What increases the risk? The following factors make you more  likely to develop kidney stones:  Having had a kidney stone in the past.  Having a family history of kidney stones.  Not drinking enough water.  Eating a diet that is high in protein, salt (sodium), or sugar.  Being overweight or obese.  What are the signs or symptoms? Symptoms of a kidney stone may include:  Nausea.  Vomiting.  Blood in the urine (hematuria).  Pain in the side of the abdomen, right below the ribs (flank pain). Pain usually spreads (radiates) to the groin.  Needing to urinate frequently or urgently.  How is this diagnosed? This condition may be diagnosed based on:  Your medical history.  A physical exam.  Blood tests.  Urine tests.  CT scan.  Abdominal X-ray.  A procedure to examine the inside of the bladder (cystoscopy).  How is this treated? Treatment for kidney stones depends on the size, location, and makeup of the stones. Treatment may  involve:  Analyzing your urine before and after you pass the stone through urination.  Being monitored at the hospital until you pass the stone through urination.  Increasing your fluid intake and decreasing the amount of calcium and protein in your diet.  A procedure to break up kidney stones in the bladder using: ? A focused beam of light (laser therapy). ? Shock waves (extracorporeal shock wave lithotripsy).  Surgery to remove kidney stones. This may be needed if you have severe pain or have stones that block your urinary tract.  Follow these instructions at home: Eating and drinking   Drink enough fluid to keep your urine clear or pale yellow. This will help you to pass the kidney stone.  If directed, change your diet. This may include: ? Limiting how much sodium you eat. ? Eating more fruits and vegetables. ? Limiting how much meat, poultry, fish, and eggs you eat.  Follow instructions from your health care provider about eating or drinking restrictions. General instructions  Collect urine samples as told by your health care provider. You may need to collect a urine sample: ? 24 hours after you pass the stone. ? 8-12 weeks after passing the kidney stone, and every 6-12 months after that.  Strain your urine every time you urinate, for as long as directed. Use the strainer that your health care provider recommends.  Do not throw out the kidney stone after passing it. Keep the stone so it can be tested by your health care provider. Testing the makeup of your kidney stone may help prevent you from getting kidney stones in the future.  Take over-the-counter and prescription medicines only as told by your health care provider.  Keep all follow-up visits as told by your health care provider. This is important. You may need follow-up X-rays or ultrasounds to make sure that your stone has passed. How is this prevented? To prevent another kidney stone:  Drink enough fluid to keep  your urine clear or pale yellow. This is the best way to prevent kidney stones.  Eat a healthy diet and follow recommendations from your health care provider about foods to avoid. You may be instructed to eat a low-protein diet. Recommendations vary depending on the type of kidney stone that you have.  Maintain a healthy weight.  Contact a health care provider if:  You have pain that gets worse or does not get better with medicine. Get help right away if:  You have a fever or chills.  You develop severe pain.  You develop new abdominal  pain.  You faint.  You are unable to urinate. This information is not intended to replace advice given to you by your health care provider. Make sure you discuss any questions you have with your health care provider. Document Released: 08/28/2005 Document Revised: 03/17/2016 Document Reviewed: 02/11/2016 Elsevier Interactive Patient Education  2018 [employer]. Low Back Strain Rehab Ask your health care provider which exercises are safe for you. Do exercises exactly as told by your health care provider and adjust them as directed. It is normal to feel mild stretching, pulling, tightness, or discomfort as you do these exercises, but you should stop right away if you feel sudden pain or your pain gets worse. Do not begin these exercises until told by your health care provider. Stretching and range of motion exercises These exercises warm up your muscles and joints and improve the movement and flexibility of your back. These exercises also help to relieve pain, numbness, and tingling. Exercise A: Single knee to chest  1. Lie on your back on a firm surface with both legs straight. 2. Bend one of your knees. Use your hands to move your knee up toward your chest until you feel a gentle stretch in your lower back and buttock. ? Hold your leg in this position by holding onto the front of your knee. ? Keep your other leg as straight as possible. 3. Hold for  ______15____ seconds. 4. Slowly return to the starting position. 5. Repeat with your other leg. Repeat ____3______ times. Complete this exercise ______2____ times a day. Exercise B: Prone extension on elbows  1. Lie on your abdomen on a firm surface. 2. Prop yourself up on your elbows. 3. Use your arms to help lift your chest up until you feel a gentle stretch in your abdomen and your lower back. ? This will place some of your body weight on your elbows. If this is uncomfortable, try stacking pillows under your chest. ? Your hips should stay down, against the surface that you are lying on. Keep your hip and back muscles relaxed. 4. Hold for ______5____ seconds. 5. Slowly relax your upper body and return to the starting position. Repeat _____3_____ times. Complete this exercise ____2______ times a day. Strengthening exercises These exercises build strength and endurance in your back. Endurance is the ability to use your muscles for a long time, even after they get tired. Exercise C: Pelvic tilt 1. Lie on your back on a firm surface. Bend your knees and keep your feet flat. 2. Tense your abdominal muscles. Tip your pelvis up toward the ceiling and flatten your lower back into the floor. ? To help with this exercise, you may place a small towel under your lower back and try to push your back into the towel. 3. Hold for ____15______ seconds. 4. Let your muscles relax completely before you repeat this exercise. Repeat ______3____ times. Complete this exercise _____2_____ times a day. Exercise D: Alternating arm and leg raises  1. Get on your hands and knees on a firm surface. If you are on a hard floor, you may want to use padding to cushion your knees, such as an exercise mat. 2. Line up your arms and legs. Your hands should be below your shoulders, and your knees should be below your hips. 3. Lift your left leg behind you. At the same time, raise your right arm and straighten it in front of  you. ? Do not lift your leg higher than your hip. ? Do not  lift your arm higher than your shoulder. ? Keep your abdominal and back muscles tight. ? Keep your hips facing the ground. ? Do not arch your back. ? Keep your balance carefully, and do not hold your breath. 4. Hold for ____5______ seconds. 5. Slowly return to the starting position and repeat with your right leg and your left arm. Repeat ______3____ times. Complete this exercise ___2_______times a day. Exercise J: Single leg lower with bent knees 1. Lie on your back on a firm surface. 2. Tense your abdominal muscles and lift your feet off the floor, one foot at a time, so your knees and hips are bent in an "L" shape (at about 90 degrees). ? Your knees should be over your hips and your lower legs should be parallel to the floor. 3. Keeping your abdominal muscles tense and your knee bent, slowly lower one of your legs so your toe touches the ground. 4. Lift your leg back up to return to the starting position. ? Do not hold your breath. ? Do not let your back arch. Keep your back flat against the ground. 5. Repeat with your other leg. Repeat _____3_____ times. Complete this exercise ______2____ times a day. Posture and body mechanics  Body mechanics refers to the movements and positions of your body while you do your daily activities. Posture is part of body mechanics. Good posture and healthy body mechanics can help to relieve stress in your body's tissues and joints. Good posture means that your spine is in its natural S-curve position (your spine is neutral), your shoulders are pulled back slightly, and your head is not tipped forward. The following are general guidelines for applying improved posture and body mechanics to your everyday activities. Standing   When standing, keep your spine neutral and your feet about hip-width apart. Keep a slight bend in your knees. Your ears, shoulders, and hips should line up.  When you do a  task in which you stand in one place for a long time, place one foot up on a stable object that is 2-4 inches (5-10 cm) high, such as a footstool. This helps keep your spine neutral. Sitting   When sitting, keep your spine neutral and keep your feet flat on the floor. Use a footrest, if necessary, and keep your thighs parallel to the floor. Avoid rounding your shoulders, and avoid tilting your head forward.  When working at a desk or a computer, keep your desk at a height where your hands are slightly lower than your elbows. Slide your chair under your desk so you are close enough to maintain good posture.  When working at a computer, place your monitor at a height where you are looking straight ahead and you do not have to tilt your head forward or downward to look at the screen. Resting   When lying down and resting, avoid positions that are most painful for you.  If you have pain with activities such as sitting, bending, stooping, or squatting (flexion-based activities), lie in a position in which your body does not bend very much. For example, avoid curling up on your side with your arms and knees near your chest (fetal position).  If you have pain with activities such as standing for a long time or reaching with your arms (extension-based activities), lie with your spine in a neutral position and bend your knees slightly. Try the following positions: ? Lying on your side with a pillow between your knees. ? Lying on your  back with a pillow under your knees. Lifting   When lifting objects, keep your feet at least shoulder-width apart and tighten your abdominal muscles.  Bend your knees and hips and keep your spine neutral. It is important to lift using the strength of your legs, not your back. Do not lock your knees straight out.  Always ask for help to lift heavy or awkward objects. This information is not intended to replace advice given to you by your health care provider. Make sure  you discuss any questions you have with your health care provider. Document Released: 08/28/2005 Document Revised: 05/04/2016 Document Reviewed: 06/09/2015 Elsevier Interactive Patient Education  2018 [employer] Low Back Strain A strain is a stretch or tear in a muscle or the strong cords of tissue that attach muscle to bone (tendons). Strains of the lower back (lumbar spine) are a common cause of low back pain. A strain occurs when muscles or tendons are torn or are stretched beyond their limits. The muscles may become inflamed, resulting in pain and sudden muscle tightening (spasms). A strain can happen suddenly due to an injury (trauma), or it can develop gradually due to overuse. There are three types of strains:  Grade 1 is a mild strain involving a minor tear of the muscle fibers or tendons. This may cause some pain but no loss of muscle strength.  Grade 2 is a moderate strain involving a partial tear of the muscle fibers or tendons. This causes more severe pain and some loss of muscle strength.  Grade 3 is a severe strain involving a complete tear of the muscle or tendon. This causes severe pain and complete or nearly complete loss of muscle strength.  What are the causes? This condition may be caused by:  Trauma, such as a fall or a hit to the body.  Twisting or overstretching the back. This may result from doing activities that require a lot of energy, such as lifting heavy objects.  What increases the risk? The following factors may increase your risk of getting this condition:  Playing contact sports.  Participating in sports or activities that put excessive stress on the back and require a lot of bending and twisting, including: ? Lifting weights or heavy objects. ? Gymnastics. ? Soccer. ? Figure skating. ? Snowboarding.  Being overweight or obese.  Having poor strength and flexibility.  What are the signs or symptoms? Symptoms of this condition may  include:  Sharp or dull pain in the lower back that does not go away. Pain may extend to the buttocks.  Stiffness.  Limited range of motion.  Inability to stand up straight due to stiffness or pain.  Muscle spasms.  How is this diagnosed? This condition may be diagnosed based on:  Your symptoms.  Your medical history.  A physical exam. ? Your health care provider may push on certain areas of your back to determine the source of your pain. ? You may be asked to bend forward, backward, and side to side to assess the severity of your pain and your range of motion.  Imaging tests, such as: ? X-rays. ? MRI.  How is this treated? Treatment for this condition may include:  Applying heat and cold to the affected area.  Medicines to help relieve pain and to relax your muscles (muscle relaxants).  NSAIDs to help reduce swelling and discomfort.  Physical therapy.  When your symptoms improve, it is important to gradually return to your normal routine as soon as  possible to reduce pain, avoid stiffness, and avoid loss of muscle strength. Generally, symptoms should improve within 6 weeks of treatment. However, recovery time varies. Follow these instructions at home: Managing pain, stiffness, and swelling  If directed, apply ice to the injured area during the first 24 hours after your injury. ? Put ice in a plastic bag. ? Place a towel between your skin and the bag. ? Leave the ice on for 20 minutes, 2-3 times a day.  If directed, apply heat to the affected area as often as told by your health care provider. Use the heat source that your health care provider recommends, such as a moist heat pack or a heating pad. ? Place a towel between your skin and the heat source. ? Leave the heat on for 20-30 minutes. ? Remove the heat if your skin turns bright red. This is especially important if you are unable to feel pain, heat, or cold. You may have a greater risk of getting  burned. Activity  Rest and return to your normal activities as told by your health care provider. Ask your health care provider what activities are safe for you.  Avoid activities that take a lot of effort (are strenuous) for as long as told by your health care provider.  Do exercises as told by your health care provider. General instructions   Take over-the-counter and prescription medicines only as told by your health care provider.  If you have questions or concerns about safety while taking pain medicine, talk with your health care provider.  Do not drive or operate heavy machinery until you know how your pain medicine affects you.  Do not use any tobacco products, such as cigarettes, chewing tobacco, and e-cigarettes. Tobacco can delay bone healing. If you need help quitting, ask your health care provider.  Keep all follow-up visits as told by your health care provider. This is important. How is this prevented?  Warm up and stretch before being active.  Cool down and stretch after being active.  Give your body time to rest between periods of activity.  Avoid: ? Being physically inactive for long periods at a time. ? Exercising or playing sports when you are tired or in pain.  Use correct form when playing sports and lifting heavy objects.  Use good posture when sitting and standing.  Maintain a healthy weight.  Sleep on a mattress with medium firmness to support your back.  Make sure to use equipment that fits you, including shoes that fit well.  Be safe and responsible while being active to avoid falls.  Do at least 150 minutes of moderate-intensity exercise each week, such as brisk walking or water aerobics. Try a form of exercise that takes stress off your back, such as swimming or stationary cycling.  Maintain physical fitness, including: ? Strength. ? Flexibility. ? Cardiovascular fitness. ? Endurance. Contact a health care provider if:  Your back pain  does not improve after 6 weeks of treatment.  Your symptoms get worse. Get help right away if:  Your back pain is severe.  You are unable to stand or walk.  You develop pain in your legs.  You develop weakness in your buttocks or legs.  You have difficulty controlling when you urinate or when you have a bowel movement. This information is not intended to replace advice given to you by your health care provider. Make sure you discuss any questions you have with your health care provider. Document Released: 08/28/2005 Document Revised:  05/04/2016 Document Reviewed: 06/09/2015 Elsevier Interactive Patient Education  2017 [employer].

## 2018-02-26 NOTE — Progress Notes (Signed)
Subjective:    Patient ID: Robin Chen, female    DOB: 1961/03/25, 57 y.o.   MRN: [id number]  57 y/o [place] caucasian female established pt c/o bilateral lower back pain x2 days. Denies known injury. Noticed pain upon waking yesterday and this morning. Sts it takes her about 2-3 hours of walking around to get back to loosen up. Pain extends in to R leg at times. No other radiation. Denies any dysuria sx. Took Bayer Back and Body this morning without much relief yet.  PMHx kidney stones and main symptom back pain in past year.  Denied dehydration this weekend or decreased po intake.  Denied loss of bowel/bladder control, saddle paresthesias, arm/leg weakness/falls/dropping items.     Review of Systems  Constitutional: Negative for activity change, appetite change, chills, diaphoresis, fatigue and fever.  HENT: Negative for congestion, trouble swallowing and voice change.   Eyes: Negative for photophobia and visual disturbance.  Respiratory: Negative for cough, shortness of breath and wheezing.   Cardiovascular: Negative for leg swelling.  Gastrointestinal: Negative for abdominal pain, blood in stool, diarrhea, nausea and vomiting.  Endocrine: Negative for cold intolerance and heat intolerance.  Genitourinary: Positive for flank pain. Negative for decreased urine volume, difficulty urinating, dyspareunia, dysuria, frequency, hematuria, menstrual problem, urgency and vaginal discharge.  Musculoskeletal: Positive for back pain and myalgias. Negative for arthralgias, gait problem, joint swelling, neck pain and neck stiffness.  Skin: Negative for color change, pallor, rash and wound.  Allergic/Immunologic: Negative for environmental allergies and food allergies.  Neurological: Negative for dizziness, tremors, seizures, syncope, facial asymmetry, speech difficulty, weakness, light-headedness, numbness and headaches.  Hematological: Negative for adenopathy. Does not bruise/bleed easily.   Psychiatric/Behavioral: Negative for agitation, confusion and sleep disturbance. The patient is not nervous/anxious.        Objective:   Physical Exam  Constitutional: She is oriented to person, place, and time. Vital signs are normal. She appears well-developed and well-nourished. She is active and cooperative.  Non-toxic appearance. She does not have a sickly appearance. She does not appear ill. No distress.  HENT:  Head: Normocephalic and atraumatic.  Right Ear: Hearing and external ear normal.  Left Ear: Hearing and external ear normal.  Nose: Nose normal.  Mouth/Throat: Uvula is midline, oropharynx is clear and moist and mucous membranes are normal. No oropharyngeal exudate.  Eyes: Pupils are equal, round, and reactive to light. Conjunctivae, EOM and lids are normal. Right eye exhibits no discharge. Left eye exhibits no discharge. No scleral icterus.  Neck: Trachea normal, normal range of motion and phonation normal. Neck supple. No tracheal deviation present.  Cardiovascular: Normal rate, regular rhythm, S1 normal, S2 normal, normal heart sounds and intact distal pulses. Exam reveals no gallop, no distant heart sounds and no friction rub.  No murmur heard. Pulmonary/Chest: Effort normal and breath sounds normal. No stridor. No respiratory distress. She has no decreased breath sounds. She has no wheezes. She has no rhonchi. She has no rales. She exhibits no tenderness.  No cough observed in exam room; spoke full sentences without difficulty  Abdominal: Soft. Normal appearance. She exhibits no shifting dullness, no distension, no pulsatile liver, no fluid wave, no abdominal bruit, no ascites, no pulsatile midline mass and no mass. Bowel sounds are decreased. There is no tenderness. There is CVA tenderness. There is no rigidity, no rebound, no guarding, no tenderness at McBurney's point and negative Murphy's sign. No hernia. Hernia confirmed negative in the ventral area.  Dull to percussion  x 4  quads; nontender hypoactive bowel sounds x 4  Musculoskeletal: She exhibits tenderness. She exhibits no edema or deformity.       Right shoulder: Normal.       Left shoulder: Normal.       Right elbow: Normal.      Left elbow: Normal.       Right hip: Normal.       Left hip: Normal.       Right knee: Normal.       Left knee: Normal.       Right ankle: Normal.       Left ankle: Normal.       Cervical back: Normal.       Thoracic back: She exhibits decreased range of motion, tenderness, pain and spasm. She exhibits no bony tenderness, no swelling, no edema, no deformity, no laceration and normal pulse.       Lumbar back: She exhibits decreased range of motion, tenderness, pain and spasm. She exhibits no bony tenderness, no swelling, no edema, no deformity, no laceration and normal pulse.       Back:  Bilateral thoracic paraspinals TTP T8-L1 and bilateral SI joints TTP right greater than left; unable to touch toes only knees due to pain; flexion, lateral bending and rotation right worsens pain greater than left.  Extension does not worsen pain  +right CVA tenderness  Lymphadenopathy:       Head (right side): No submental, no submandibular, no tonsillar, no preauricular, no posterior auricular and no occipital adenopathy present.       Head (left side): No submental, no submandibular, no tonsillar, no preauricular, no posterior auricular and no occipital adenopathy present.    She has no cervical adenopathy.       Right cervical: No superficial cervical, no deep cervical and no posterior cervical adenopathy present.      Left cervical: No superficial cervical, no deep cervical and no posterior cervical adenopathy present.  Neurological: She is alert and oriented to person, place, and time. She has normal strength. She is not disoriented. She displays no atrophy, no tremor and normal reflexes. No cranial nerve deficit or sensory deficit. She exhibits normal muscle tone. She displays no seizure  activity. Coordination and gait normal. GCS eye subscore is 4. GCS verbal subscore is 5. GCS motor subscore is 6.  Reflex Scores:      Patellar reflexes are 1+ on the right side and 2+ on the left side.      Achilles reflexes are 1+ on the right side and 2+ on the left side. On off exam table/in/out of chair without difficulty; slow supine to sitting due to pain; gait sure and steady in hallway normal heel toe  Skin: Skin is warm, dry and intact. Capillary refill takes less than 2 seconds. No abrasion, no bruising, no burn, no ecchymosis, no laceration and no rash noted. She is not diaphoretic. No cyanosis or erythema. No pallor. Nails show no clubbing.  Psychiatric: She has a normal mood and affect. Her speech is normal and behavior is normal. Judgment and thought content normal. She is not actively hallucinating. Cognition and memory are normal. She is attentive.  Nursing note and vitals reviewed.    Patient notified ua dipstick normal negative for bacteria/blood in clinic verbally.  Follow up in 48 hours at 1100 for repeat dipstick urinalysis with RN Rolly SalterHaley and appt with me.  Applied thermacare low back in clinic from clinic stock.  Patient reported she still has biofreeze  and hot/cold pack at home.  Patient verbalized understanding of information/instructions, agreed with plan of care and had no further questions at this time.     Assessment & Plan:  A-right flank pain and midline low back pain/right SI joint pain  P-cyclobenazeprine/flexeril 10mg  sig t1/2 to 1 po TID prn muscle spasms #30 RF0 dispensed from [facility].  Ibuprofen 800mg  po TID prn pain #90 RF0 dispensed from [facility].  Biofreeze topical QID prn pain. Avoid alcohol intake and driving while taking cyclobenazeprine/flexeril as drowsiness common side effect.  Slow position changes as medication also lower blood pressure.  Push po fluids as history of kidney stones to keep urine clear yellow tinged. Home stretches demonstrated to patient-e.g.  Arm circles, walking up wall, chest stretches, neck AROM, chin tucks, knee to chest and rock side to side on back. Self massage or professional prn, foam roller use or tennis/racquetball.  Heat/cryotherapy 15 minutes QID prn.  Trial thermacare 1 applied from clinic stock.  Consider physical therapy referral if no improvement with prescribed therapy from [facility] and/or chiropractic care.  Ensure ergonomics correct desk at work avoid repetitive motions if possible/holding phone/laptop in hand use desk/stand and/or break up lifting items into smaller loads/weights.  Patient was instructed to rest, ice, and ROM exercises.  Activity as tolerated.   Follow up if symptoms persist or worsen especially if loss of bowel/bladder control, arm/leg weakness and/or saddle paresthesias.  Exitcare handout on kidney stones, foods to avoid with kidney stones, low back strain and rehab exercises given to patient.  Patient verbalized agreement and understanding of treatment plan and had no further questions at this time.  P2:  Injury Prevention and Fitness.  Hydrate, hydrate. Consider Flomax 0.4mg  daily and urology follow up. Patient is also to push fluids and strain urine. Call or return to clinic as needed if these symptoms worsen or fail to improve as anticipated e.g. gross hematuria, fever, worsening pain, unable to void every 8 hours, tea or cola colored urine. Exitcare handout on nephrolithiasis and foods to avoid with kidney stones  Refilled motrin 800mg  po TID prn pain take with food #90 RF0 from [facility]. Patient verbalized agreement and understanding of treatment plan and had no further questions at this time.  P2: Hydrate, post coital urination, and cranberry juice

## 2018-02-28 ENCOUNTER — Ambulatory Visit: Payer: Self-pay | Admitting: Registered Nurse

## 2018-02-28 ENCOUNTER — Encounter: Payer: Self-pay | Admitting: Registered Nurse

## 2018-02-28 VITALS — BP 107/81 | HR 83 | Temp 98.4°F

## 2018-02-28 DIAGNOSIS — R109 Unspecified abdominal pain: Secondary | ICD-10-CM

## 2018-02-28 DIAGNOSIS — M545 Low back pain, unspecified: Secondary | ICD-10-CM

## 2018-02-28 DIAGNOSIS — R030 Elevated blood-pressure reading, without diagnosis of hypertension: Secondary | ICD-10-CM

## 2018-02-28 LAB — POCT URINALYSIS DIPSTICK
BILIRUBIN UA: NEGATIVE
BILIRUBIN UA: NEGATIVE mg/dL
GLUCOSE UA: NEGATIVE
Leukocytes, UA: NEGATIVE
Nitrite, UA: NEGATIVE
Protein, UA: NEGATIVE
RBC UA: NEGATIVE
Spec Grav, UA: 1.01 (ref 1.010–1.025)
Urobilinogen, UA: 0.2 E.U./dL
pH, UA: 7 (ref 5.0–8.0)

## 2018-02-28 NOTE — Patient Instructions (Signed)
Flank Pain, Adult Flank pain is pain that is located on the side of the body between the upper abdomen and the back. This area is called the flank. The pain may occur over a short period of time (acute), or it may be long-term or recurring (chronic). It may be mild or severe. Flank pain can be caused by many things, including:  Muscle soreness or injury.  Kidney stones or kidney disease.  Stress.  A disease of the spine (vertebral disk disease).  A lung infection (pneumonia).  Fluid around the lungs (pulmonary edema).  A skin rash caused by the chickenpox virus (shingles).  Tumors that affect the back of the abdomen.  Gallbladder disease.  Follow these instructions at home:  Drink enough fluid to keep your urine clear or pale yellow.  Rest as told by your health care provider.  Take over-the-counter and prescription medicines only as told by your health care provider.  Keep a journal to track what has caused your flank pain and what has made it feel better.  Keep all follow-up visits as told by your health care provider. This is important. Contact a health care provider if:  Your pain is not controlled with medicine.  You have new symptoms.  Your pain gets worse.  You have a fever.  Your symptoms last longer than 2-3 days.  You have trouble urinating or you are urinating very frequently. Get help right away if:  You have trouble breathing or you are short of breath.  Your abdomen hurts or it is swollen or red.  You have nausea or vomiting.  You feel faint or you pass out.  You have blood in your urine. Summary  Flank pain is pain that is located on the side of the body between the upper abdomen and the back.  The pain may occur over a short period of time (acute), or it may be long-term or recurring (chronic). It may be mild or severe.  Flank pain can be caused by many things.  Contact your health care provider if your symptoms get worse or they last  longer than 2-3 days. This information is not intended to replace advice given to you by your health care provider. Make sure you discuss any questions you have with your health care provider. Document Released: 10/19/2005 Document Revised: 11/10/2016 Document Reviewed: 11/10/2016 Elsevier Interactive Patient Education  2018 [employer]. Low Back Strain A strain is a stretch or tear in a muscle or the strong cords of tissue that attach muscle to bone (tendons). Strains of the lower back (lumbar spine) are a common cause of low back pain. A strain occurs when muscles or tendons are torn or are stretched beyond their limits. The muscles may become inflamed, resulting in pain and sudden muscle tightening (spasms). A strain can happen suddenly due to an injury (trauma), or it can develop gradually due to overuse. There are three types of strains:  Grade 1 is a mild strain involving a minor tear of the muscle fibers or tendons. This may cause some pain but no loss of muscle strength.  Grade 2 is a moderate strain involving a partial tear of the muscle fibers or tendons. This causes more severe pain and some loss of muscle strength.  Grade 3 is a severe strain involving a complete tear of the muscle or tendon. This causes severe pain and complete or nearly complete loss of muscle strength.  What are the causes? This condition may be caused by:  Trauma, such as a fall or a hit to the body.  Twisting or overstretching the back. This may result from doing activities that require a lot of energy, such as lifting heavy objects.  What increases the risk? The following factors may increase your risk of getting this condition:  Playing contact sports.  Participating in sports or activities that put excessive stress on the back and require a lot of bending and twisting, including: ? Lifting weights or heavy objects. ? Gymnastics. ? Soccer. ? Figure skating. ? Snowboarding.  Being overweight or  obese.  Having poor strength and flexibility.  What are the signs or symptoms? Symptoms of this condition may include:  Sharp or dull pain in the lower back that does not go away. Pain may extend to the buttocks.  Stiffness.  Limited range of motion.  Inability to stand up straight due to stiffness or pain.  Muscle spasms.  How is this diagnosed? This condition may be diagnosed based on:  Your symptoms.  Your medical history.  A physical exam. ? Your health care provider may push on certain areas of your back to determine the source of your pain. ? You may be asked to bend forward, backward, and side to side to assess the severity of your pain and your range of motion.  Imaging tests, such as: ? X-rays. ? MRI.  How is this treated? Treatment for this condition may include:  Applying heat and cold to the affected area.  Medicines to help relieve pain and to relax your muscles (muscle relaxants).  NSAIDs to help reduce swelling and discomfort.  Physical therapy.  When your symptoms improve, it is important to gradually return to your normal routine as soon as possible to reduce pain, avoid stiffness, and avoid loss of muscle strength. Generally, symptoms should improve within 6 weeks of treatment. However, recovery time varies. Follow these instructions at home: Managing pain, stiffness, and swelling  If directed, apply ice to the injured area during the first 24 hours after your injury. ? Put ice in a plastic bag. ? Place a towel between your skin and the bag. ? Leave the ice on for 20 minutes, 2-3 times a day.  If directed, apply heat to the affected area as often as told by your health care provider. Use the heat source that your health care provider recommends, such as a moist heat pack or a heating pad. ? Place a towel between your skin and the heat source. ? Leave the heat on for 20-30 minutes. ? Remove the heat if your skin turns bright red. This is  especially important if you are unable to feel pain, heat, or cold. You may have a greater risk of getting burned. Activity  Rest and return to your normal activities as told by your health care provider. Ask your health care provider what activities are safe for you.  Avoid activities that take a lot of effort (are strenuous) for as long as told by your health care provider.  Do exercises as told by your health care provider. General instructions   Take over-the-counter and prescription medicines only as told by your health care provider.  If you have questions or concerns about safety while taking pain medicine, talk with your health care provider.  Do not drive or operate heavy machinery until you know how your pain medicine affects you.  Do not use any tobacco products, such as cigarettes, chewing tobacco, and e-cigarettes. Tobacco can delay bone healing. If you  need help quitting, ask your health care provider.  Keep all follow-up visits as told by your health care provider. This is important. How is this prevented?  Warm up and stretch before being active.  Cool down and stretch after being active.  Give your body time to rest between periods of activity.  Avoid: ? Being physically inactive for long periods at a time. ? Exercising or playing sports when you are tired or in pain.  Use correct form when playing sports and lifting heavy objects.  Use good posture when sitting and standing.  Maintain a healthy weight.  Sleep on a mattress with medium firmness to support your back.  Make sure to use equipment that fits you, including shoes that fit well.  Be safe and responsible while being active to avoid falls.  Do at least 150 minutes of moderate-intensity exercise each week, such as brisk walking or water aerobics. Try a form of exercise that takes stress off your back, such as swimming or stationary cycling.  Maintain physical fitness,  including: ? Strength. ? Flexibility. ? Cardiovascular fitness. ? Endurance. Contact a health care provider if:  Your back pain does not improve after 6 weeks of treatment.  Your symptoms get worse. Get help right away if:  Your back pain is severe.  You are unable to stand or walk.  You develop pain in your legs.  You develop weakness in your buttocks or legs.  You have difficulty controlling when you urinate or when you have a bowel movement. This information is not intended to replace advice given to you by your health care provider. Make sure you discuss any questions you have with your health care provider. Document Released: 08/28/2005 Document Revised: 05/04/2016 Document Reviewed: 06/09/2015 Elsevier Interactive Patient Education  2017 [employer].

## 2018-02-28 NOTE — Progress Notes (Signed)
Subjective:    Patient ID: Robin Chen, female    DOB: Mar 24, 1961, 57 y.o.   MRN: [id number]  57y/o caucasian female established patient  Here for re-evaluation low back pain.  Stated stiff/worst pain midline upon awakening and after walking around at work 1-2 hours feels better.  Thermacare helped a lot.  No longer with right flank pain denied dysuria/hematuria/fever/chills/nausea/vomiting/loss of bowel bladder control, saddle paresthesias or arm/leg weakness.  Motrin/flexeril/tylenol and biofreeze prn pain.  Here for repeat urinalysis and repeat exam as history kidney stones that present only as back pain.     Review of Systems  Constitutional: Negative for activity change, appetite change, chills, diaphoresis, fatigue and fever.  HENT: Negative for trouble swallowing and voice change.   Eyes: Negative for photophobia and visual disturbance.  Respiratory: Negative for cough, choking, shortness of breath, wheezing and stridor.   Cardiovascular: Negative for leg swelling.  Gastrointestinal: Negative for abdominal pain, blood in stool, constipation, diarrhea, nausea and vomiting.  Endocrine: Negative for cold intolerance and heat intolerance.  Genitourinary: Negative for decreased urine volume, difficulty urinating, dysuria, enuresis, frequency, genital sores, hematuria and urgency.  Musculoskeletal: Positive for back pain and myalgias. Negative for arthralgias, gait problem, joint swelling, neck pain and neck stiffness.  Skin: Negative for color change, pallor, rash and wound.  Allergic/Immunologic: Negative for environmental allergies and food allergies.  Neurological: Negative for dizziness, tremors, seizures, syncope, facial asymmetry, speech difficulty, weakness, light-headedness, numbness and headaches.  Hematological: Negative for adenopathy. Does not bruise/bleed easily.  Psychiatric/Behavioral: Negative for agitation, confusion and sleep disturbance.       Objective:   Physical  Exam  Constitutional: She is oriented to person, place, and time. Vital signs are normal. She appears well-developed and well-nourished. She is active and cooperative.  Non-toxic appearance. She does not have a sickly appearance. She does not appear ill. No distress.  HENT:  Head: Normocephalic and atraumatic.  Right Ear: Hearing and external ear normal.  Left Ear: Hearing and external ear normal.  Nose: Nose normal. Right sinus exhibits no maxillary sinus tenderness and no frontal sinus tenderness. Left sinus exhibits no maxillary sinus tenderness and no frontal sinus tenderness.  Mouth/Throat: Uvula is midline, oropharynx is clear and moist and mucous membranes are normal. No oropharyngeal exudate.  Eyes: Pupils are equal, round, and reactive to light. Conjunctivae, EOM and lids are normal. Right eye exhibits no discharge. Left eye exhibits no discharge. No scleral icterus.  Neck: Trachea normal, normal range of motion and phonation normal. Neck supple. No JVD present. No tracheal deviation present. No thyromegaly present.  Cardiovascular: Normal rate, regular rhythm, S1 normal, S2 normal, normal heart sounds and intact distal pulses.  No murmur heard. Pulses:      Radial pulses are 2+ on the right side, and 2+ on the left side.  Pulmonary/Chest: Effort normal and breath sounds normal. No stridor. No respiratory distress. She has no decreased breath sounds. She has no wheezes. She has no rhonchi. She has no rales.  No cough observed in exam room ;spoke full sentences without difficulty  Abdominal: Soft. Normal appearance. She exhibits no distension, no fluid wave and no ascites. There is no tenderness. There is no rigidity, no rebound, no guarding, no CVA tenderness, no tenderness at McBurney's point and negative Murphy's sign. No hernia.  Musculoskeletal: She exhibits tenderness. She exhibits no edema or deformity.       Right shoulder: Normal.       Left shoulder: Normal.  Right elbow:  Normal.      Left elbow: Normal.       Right hip: Normal.       Left hip: Normal. She exhibits normal range of motion, normal strength, no tenderness, no bony tenderness, no swelling, no crepitus, no deformity and no laceration.       Right knee: Normal.       Left knee: Normal.       Right ankle: Normal.       Left ankle: Normal.       Cervical back: Normal.       Thoracic back: Normal.       Lumbar back: She exhibits decreased range of motion, tenderness, bony tenderness and pain. She exhibits no swelling, no edema, no deformity, no laceration, no spasm and normal pulse.       Right hand: Normal.       Left hand: Normal.       Right upper leg: Normal.       Left upper leg: Normal. She exhibits no tenderness, no bony tenderness, no swelling, no edema, no deformity and no laceration.       Right lower leg: Normal.       Left lower leg: Normal.  Bilateral SI joints TTP along with paraspinals lumbar and sacral TTP; normal heel toe gait; on/off exam table and in/out of chair without difficulty; upper and lower extremity strength equal 5/5 bilaterally  Lymphadenopathy:    She has no cervical adenopathy.       Right cervical: No superficial cervical adenopathy present.      Left cervical: No superficial cervical adenopathy present.  Neurological: She is alert and oriented to person, place, and time. She has normal strength. She is not disoriented. She displays no atrophy, no tremor and normal reflexes. No cranial nerve deficit or sensory deficit. She exhibits normal muscle tone. She displays no seizure activity. Coordination and gait normal. GCS eye subscore is 4. GCS verbal subscore is 5. GCS motor subscore is 6.  Reflex Scores:      Patellar reflexes are 1+ on the right side and 1+ on the left side. Strength 5/5 bilaterally arms/legs  Skin: Skin is warm and dry. Capillary refill takes less than 2 seconds. No rash noted. She is not diaphoretic. No cyanosis or erythema. No pallor. Nails show no  clubbing.  Psychiatric: She has a normal mood and affect. Her speech is normal and behavior is normal. Judgment and thought content normal. She is not actively hallucinating. Cognition and memory are normal. She is attentive.  Nursing note and vitals reviewed.    Able to touch ankles today with ease; no CVA tenderness bilaterally; pain with right lateral bending and rotation; none with left lateral or rotation or extension; TTP T10 paraspinals to L1.  Dull to percussion x 4; hypoactive bowel sounds x 4 quads; DTRs equal 1+  Discussed ua dipstick normal except for concentrated needs to increase water intake     Assessment & Plan:  A-low back strain  P-given thermacare from clinic stock; continue tylenol, biofreeze, stretching, flexeril and motrin prn.cyclobenazeprine/flexeril 10mg  sig t1/2 to 1 po TID prn muscle spasms #30 RF0 dispensed from [facility].  Ibuprofen 800mg  po TID prn pain #90 RF0 dispensed from [facility].  Biofreeze topical QID prn pain. Avoid alcohol intake and driving while taking cyclobenazeprine/flexeril as drowsiness common side effect.  Slow position changes as medication also lower blood pressure.  Push po fluids as history of kidney stones to keep  urine clear yellow tinged. Home stretches demonstrated to patient-e.g. Arm circles, walking up wall, chest stretches, neck AROM, chin tucks, knee to chest and rock side to side on back. Self massage or professional prn, foam roller use or tennis/racquetball.  Heat/cryotherapy 15 minutes QID prn.  Trial thermacare 1 applied from clinic stock.  Consider physical therapy referral if no improvement with [facility] and/or chiropractic care.  Ensure ergonomics correct desk at work avoid repetitive motions if possible/holding phone/laptop in hand use desk/stand and/or break up lifting items into smaller loads/weights.  Patient was instructed to rest, ice, and ROM exercises.  Activity as tolerated.   Follow up if symptoms persist or worsen  especially if loss of bowel/bladder control, arm/leg weakness and/or saddle paresthesias.  Exitcare handout on low back strain and flank pain.  Patient verbalized agreement and understanding of treatment plan and had no further questions at this time.  P2:  Injury Prevention and Fitness.  Hydrate, hydrate. Patient is also to push fluids and strain urine. Call or return to clinic as needed if these symptoms worsen or fail to improve as anticipated e.g. gross hematuria, fever, worsening pain, unable to void every 8 hours, tea or cola colored urine. Exitcare handout on nephrolithiasis and foods to avoid with kidney stones  tylenol 1000mg  po QID prn pain may alternate with motrin 800mg  po TID prn pain take with food at home. Patient verbalized agreement and understanding of treatment plan and had no further questions at this time.  P2: Hydrate, post coital urination, and cranberry juice

## 2018-06-10 ENCOUNTER — Ambulatory Visit: Payer: PRIVATE HEALTH INSURANCE | Admitting: Physician Assistant

## 2018-08-13 ENCOUNTER — Encounter: Payer: Self-pay | Admitting: Registered Nurse

## 2018-08-13 ENCOUNTER — Ambulatory Visit: Payer: Self-pay | Admitting: Registered Nurse

## 2018-08-13 VITALS — BP 127/90 | HR 90 | Temp 98.8°F

## 2018-08-13 DIAGNOSIS — S60052A Contusion of left little finger without damage to nail, initial encounter: Secondary | ICD-10-CM

## 2018-08-13 DIAGNOSIS — M25542 Pain in joints of left hand: Secondary | ICD-10-CM

## 2018-08-13 MED ORDER — ACETAMINOPHEN 500 MG PO TABS: 1000.0000 mg | ORAL_TABLET | Freq: Four times a day (QID) | ORAL | 0 refills | Status: AC | PRN

## 2018-08-13 MED ORDER — IBUPROFEN 800 MG PO TABS: 800.0000 mg | ORAL_TABLET | Freq: Three times a day (TID) | ORAL | 0 refills | Status: AC | PRN

## 2018-08-13 NOTE — Progress Notes (Signed)
Subjective:    Patient ID: Robin Chen, female    DOB: [birth year], 57 y.o.   MRN: [id number]  57y/o Caucasian [place] established female pt c/o L 5th finger pain after accidentally hitting the side of her hand on a wall last night at home. Swelling noted to 5th finger. TTP. Limited ROM 2/2 pain. Took 200mg  Ibuprofen last night with little relief. Used ice after injury last pm. Given 800mg  Ibuprofen in clinic this am and small splint applied to finger to protect during interim waiting for appt with PA this am. Some swelling, pain and bruising  Right hand dominant.  Pain with flexing finger.      Review of Systems  Constitutional: Negative for activity change, appetite change, chills, diaphoresis, fatigue and fever.  HENT: Negative for trouble swallowing and voice change.   Eyes: Negative for photophobia and visual disturbance.  Respiratory: Negative for shortness of breath, wheezing and stridor.   Cardiovascular: Negative for chest pain.  Gastrointestinal: Negative for abdominal distention, abdominal pain, diarrhea, nausea and vomiting.  Endocrine: Negative for cold intolerance and heat intolerance.  Genitourinary: Negative for difficulty urinating and dysuria.  Musculoskeletal: Positive for arthralgias, joint swelling and myalgias. Negative for back pain, neck pain and neck stiffness.  Skin: Positive for color change. Negative for pallor, rash and wound.  Allergic/Immunologic: Negative for environmental allergies and food allergies.  Neurological: Negative for tremors, weakness, numbness and headaches.  Hematological: Negative for adenopathy. Does not bruise/bleed easily.  Psychiatric/Behavioral: Negative for agitation, confusion and sleep disturbance. The patient is not nervous/anxious.        Objective:   Physical Exam  Constitutional: She is oriented to person, place, and time. Vital signs are normal. She appears well-developed and well-nourished. She is cooperative.  Non-toxic  appearance. She does not have a sickly appearance. She does not appear ill. No distress.  HENT:  Head: Normocephalic and atraumatic.  Right Ear: Hearing and external ear normal.  Left Ear: Hearing and external ear normal.  Nose: Nose normal.  Mouth/Throat: Uvula is midline, oropharynx is clear and moist and mucous membranes are normal. No oropharyngeal exudate. No tonsillar exudate.  Eyes: Pupils are equal, round, and reactive to light. Conjunctivae, EOM and lids are normal. Right eye exhibits no discharge. Left eye exhibits no discharge. No scleral icterus.  Neck: Trachea normal, normal range of motion and phonation normal. Neck supple. No spinous process tenderness present. No neck rigidity. No tracheal deviation, no edema and normal range of motion present.  Cardiovascular: Normal rate, regular rhythm, normal heart sounds and intact distal pulses.  Pulses:      Radial pulses are 2+ on the right side, and 2+ on the left side.  Pulmonary/Chest: Effort normal and breath sounds normal. No stridor. No respiratory distress. She has no decreased breath sounds. She has no wheezes. She has no rhonchi. She has no rales.  Abdominal: Soft. Normal appearance. She exhibits no distension. There is no rigidity and no guarding.  Musculoskeletal: She exhibits edema and tenderness. She exhibits no deformity.       Right shoulder: Normal.       Left shoulder: Normal.       Right elbow: Normal.      Left elbow: Normal.       Right wrist: Normal.       Left wrist: Normal.       Right hip: Normal.       Left hip: Normal.       Right knee: Normal.  Left knee: Normal.       Right ankle: Normal.       Left ankle: Normal.       Cervical back: Normal.       Thoracic back: Normal.       Lumbar back: Normal.       Right forearm: Normal.       Left forearm: Normal.       Right hand: Normal.       Left hand: She exhibits decreased range of motion, tenderness, bony tenderness and swelling. She exhibits  normal two-point discrimination, normal capillary refill, no deformity and no laceration. Normal sensation noted. Decreased strength noted. She exhibits finger abduction and thumb/finger opposition. She exhibits no wrist extension trouble.       Hands: 1+/4 5th digit and lateral hand nonpitting edema; ecchymosis at MCP joint palmar surface and PIP joint dorsal surface; unable to flex 5th left digit greater than 5 degrees had pain with any movement and palpation of PIP and MCP joint palmar and dorsal surfaces; pain with ab/adduction/flexion 5th left digit with and without resistence that radiates into 5th MCP/lateral hand; no erythema or crepitus; all other fingers bilateral hands full AROM DIP/PIP/MCP joints/wrist/elbow/shoulder without pain  Lymphadenopathy:       Head (right side): No submental, no submandibular, no tonsillar, no preauricular, no posterior auricular and no occipital adenopathy present.       Head (left side): No submental, no submandibular, no tonsillar, no preauricular, no posterior auricular and no occipital adenopathy present.    She has no cervical adenopathy.       Right cervical: No superficial cervical adenopathy present.      Left cervical: No superficial cervical adenopathy present.  Neurological: She is alert and oriented to person, place, and time. She is not disoriented. She displays no atrophy and no tremor. No cranial nerve deficit or sensory deficit. She exhibits normal muscle tone. She displays no seizure activity. Coordination and gait normal. GCS eye subscore is 4. GCS verbal subscore is 5. GCS motor subscore is 6.  Extremities bilaterally equal strength 5/5 except 5th left digit 4/5 due to pain with and without resistance  Skin: Skin is warm, dry and intact. Capillary refill takes less than 2 seconds. Bruising noted. No abrasion, no burn, no ecchymosis, no laceration, no lesion, no petechiae and no rash noted. She is not diaphoretic. No cyanosis or erythema. No  pallor. Nails show no clubbing.     MCP and PIP joint 5th left digit  Psychiatric: She has a normal mood and affect. Her speech is normal and behavior is normal. Judgment and thought content normal. She is not actively hallucinating. Cognition and memory are normal. She does not express impulsivity or inappropriate judgment. She is attentive.  Nursing note and vitals reviewed.         Assessment & Plan:  A-left 5th digit pain, contusion  P-ulnar gutter splint with SAM splint as fiberglass or plaster not available in this clinic.  Patient wanted to go to [facility] office for xray as less expensive for her at [facility].  Motrin 800mg  po x 1 helped this morning administered by RN Rolly Salter from clinic stock and 5th digit splint application removed for evaluation by me.  Changed finger splint to SAM ulnar gutter in clinic and secured with cobain.  Given 1 chemical ice pack and tylenol 1000mg  po QID prn pain/swelling 8 UD from clinic stock.  If having throbbing pain elevate and ice.  If finger turns  blue cold and had not had recent cryotherapy elevate to try to decrease swelling and if no improvement seek immediate re-evaluation as decreased circulation symptoms.  Discussed with patient I suspect boxers fracture keep little finger straight.  May remove splint to shower.  Avoid impact and keeping in dependent position for extended periods or lifting with left hand.  If boxer's fracture seen on xray recommend orthopedics appt.  Exitcare handout printed and given on contusion and boxers fracture.  Patient verbalized understanding information/instructions, agreed with plan of care and had no further questions at this time.

## 2018-08-13 NOTE — Patient Instructions (Signed)
Contusion A contusion is a deep bruise. Contusions are the result of a blunt injury to tissues and muscle fibers under the skin. The injury causes bleeding under the skin. The skin overlying the contusion may turn blue, purple, or yellow. Minor injuries will give you a painless contusion, but more severe contusions may stay painful and swollen for a few weeks. What are the causes? This condition is usually caused by a blow, trauma, or direct force to an area of the body. What are the signs or symptoms? Symptoms of this condition include:  Swelling of the injured area.  Pain and tenderness in the injured area.  Discoloration. The area may have redness and then turn blue, purple, or yellow.  How is this diagnosed? This condition is diagnosed based on a physical exam and medical history. An X-ray, CT scan, or MRI may be needed to determine if there are any associated injuries, such as broken bones (fractures). How is this treated? Specific treatment for this condition depends on what area of the body was injured. In general, the best treatment for a contusion is resting, icing, applying pressure to (compression), and elevating the injured area. This is often called the RICE strategy. Over-the-counter anti-inflammatory medicines may also be recommended for pain control. Follow these instructions at home:  Rest the injured area.  If directed, apply ice to the injured area: ? Put ice in a plastic bag. ? Place a towel between your skin and the bag. ? Leave the ice on for 20 minutes, 2-3 times per day.  If directed, apply light compression to the injured area using an elastic bandage. Make sure the bandage is not wrapped too tightly. Remove and reapply the bandage as directed by your health care provider.  If possible, raise (elevate) the injured area above the level of your heart while you are sitting or lying down.  Take over-the-counter and prescription medicines only as told by your health  care provider. Contact a health care provider if:  Your symptoms do not improve after several days of treatment.  Your symptoms get worse.  You have difficulty moving the injured area. Get help right away if:  You have severe pain.  You have numbness in a hand or foot.  Your hand or foot turns pale or cold. This information is not intended to replace advice given to you by your health care provider. Make sure you discuss any questions you have with your health care provider. Document Released: 06/07/2005 Document Revised: 01/06/2016 Document Reviewed: 01/13/2015 Elsevier Interactive Patient Education  2018 [employer]. Boxer's Fracture A boxer's fracture is a break (fracture) of the bone in your hand that connects your little finger to your wrist (fifth metacarpal). This type of fracture usually happens at the end of the bone, closest to the little finger. The knuckle is often pushed down by the impact. In some cases, only a splint or brace is needed, or you may need a cast. Casting or splinting may include taping your injured finger to the next finger (buddy taping). You may need surgery to repair the fracture. This may involve the use of wires, screws, or plates to hold the bone pieces in place. What are the causes? This injury may be caused by:  Hitting an object with a clenched fist.  A hard, direct hit to the hand.  An injury that crushes the hand.  What increases the risk? This injury is more likely to occur if:  You are in a fistfight.  You have certain bone diseases.  What are the signs or symptoms? Symptoms of this type of fracture develop soon after the injury. Symptoms may include:  Swelling of the hand.  Pain.  Pain when moving the fifth finger or touching the hand.  Abnormal position of the finger.  Not being able to move the finger.  A shortened finger.  A finger knuckle that looks sunken in.  How is this diagnosed? This injury may be diagnosed  based on your symptoms, especially if you had a recent hand injury. Your health care provider will perform a physical exam, and you may also have X-rays to confirm the diagnosis. How is this treated? Treatment for this injury depends on how severe it is. Possible treatments include:  Closed reduction. If your bone is stable and can be moved back into place, you may only need to wear a cast or splint or have buddy taping.  Open reduction with internal fixation (ORIF). This may be needed if your fracture is far out of place or goes through the joint surface of the bone. This treatment involves open surgery to move your bones back into the right position. Screws, wires, or plates may be used to stabilize the fracture.  You may need to wear a cast or a splint for several weeks. You will also need to have follow-up X-rays to make sure that the bone is healing well and staying in position. After you no longer need the cast or splint, you may need physical therapy. This will help you to regain full movement and strength in your hand. Follow these instructions at home: If you have a cast:  Do not stick anything inside the cast to scratch your skin. Doing that increases your risk of infection.  Check the skin around the cast every day. Report any concerns to your health care provider. You may put lotion on dry skin around the edges of the cast. Do not apply lotion to the skin underneath the cast. If you have a splint:  Wear it as directed by your health care provider. Remove it only as directed by your health care provider.  Loosen the splint if your fingers become numb and tingle, or if they turn cold and blue. Bathing  Cover the cast or splint with a watertight plastic bag to protect it from water while you take a bath or a shower. Do not let the cast or splint get wet. Managing pain, stiffness, and swelling  If directed, apply ice to the injured area (if you have a splint, not a cast): ? Put ice  in a plastic bag. ? Place a towel between your skin and the bag. ? Leave the ice on for 20 minutes, 2-3 times a day.  Move your fingers often to avoid stiffness and to lessen swelling.  Raise the injured area above the level of your heart while you are sitting or lying down. Driving  Do not drive or operate heavy machinery while taking pain medicine.  Do not drive while wearing a cast or splint on a hand or foot that you use for driving. Activity  Return to your normal activities as directed by your health care provider. Ask your health care provider what activities are safe for you. General instructions  Do not put pressure on any part of the cast or splint until it is fully hardened. This may take several hours.  Keep the cast or splint clean and dry.  Do not use any tobacco products,  including cigarettes, chewing tobacco, or electronic cigarettes. Tobacco can delay bone healing. If you need help quitting, ask your health care provider.  Take medicines only as directed by your health care provider.  Keep all follow-up visits as directed by your health care provider. This is important. Contact a health care provider if:  Your pain is getting worse.  You have redness, swelling, or pain in the injured area.  You have fluid, blood, or pus coming from under your cast or splint.  You notice a bad smell coming from under your cast or splint.  You have a fever.  Your cast or splint feels too tight or too loose.  You cast is coming apart. Get help right away if:  You develop a rash.  You have trouble breathing.  Your skin or nails on your injured hand turn blue or gray even after you loosen your splint.  Your injured hand feels cold or becomes numb even after you loosen your splint.  You develop severe pain under the cast or in your hand. This information is not intended to replace advice given to you by your health care provider. Make sure you discuss any questions you  have with your health care provider. Document Released: 08/28/2005 Document Revised: 02/03/2016 Document Reviewed: 06/17/2014 Elsevier Interactive Patient Education  2018 [employer]

## 2018-08-16 ENCOUNTER — Ambulatory Visit: Payer: Self-pay | Admitting: *Deleted

## 2018-08-16 DIAGNOSIS — M25542 Pain in joints of left hand: Secondary | ICD-10-CM

## 2018-08-16 DIAGNOSIS — S60052A Contusion of left little finger without damage to nail, initial encounter: Secondary | ICD-10-CM

## 2018-08-16 NOTE — Progress Notes (Signed)
Pt in to RN clinic this am requesting re wrap of 4th and 5th fingers on L hand. [facility] after hitting side of hand on wall at home the night before. Splinted with SAM splint in ulnar gutter fashion on 08/13/18 by clinic NP since fiberglass or plaster not available.  She presents today with gauze between 4th and 5th fingers and those buddy wrapped with Coban. She sts this is securement that ortho suggested. Reports she tried to be seen at [facility] [place] yesterday but could not be seen there or at [facility] due to wait times. [facility]-not on Epic) and had xray performed. They reported blurry images and ortho consult needed and were [facility]. Pt arrived to Ortho appt but no communication had been received from them so she repeated the xray. She points to 4th and 5th MCP joints, and 5th PIP and sts "they said I broke here, here, and here." Denies pain extending past knuckles into hand. Ecchymosis more extensive than at 12/3 OV, now at 4th/5th MCP joints dorsal surface extending to wrist, 5th PIP dorsal surface, and 4th/5th MCP, 5th PIP palmar surface extending to 5th DIP and mid lateral palm.   Ortho suggested surgery to repair but she is scared of this. They offered alternative of conservative treatment with wrapping as described above and at least 6 weeks healing time with f/u in office. She does report they informed her of the chance that her 5th finger could curve inward towards her 4th finger if it heals incorrectly and she sts she is fine with this if it avoids surgery.

## 2018-08-29 ENCOUNTER — Telehealth: Payer: Self-pay | Admitting: Registered Nurse

## 2018-08-29 ENCOUNTER — Ambulatory Visit: Payer: Self-pay | Admitting: Registered Nurse

## 2018-08-29 ENCOUNTER — Encounter: Payer: Self-pay | Admitting: Registered Nurse

## 2018-08-29 NOTE — Telephone Encounter (Signed)
Here for dressing change left hand cobain and gauze.  Patient reported she was seen by orthopedics, xray showed 2 fingers fractured.  recommended surgery for 4th and 5th digit fractures but she refused.  They told her not to use finger splint but to buddy tape fingers and she has been doing so with gauze and cobain here for assistance to change buddy tape/gauze.  Given 1 roll cobain from clinic stock.  Capillary refill less than 2 seconds right hand/fingers, skin warm dry and pink ecchymosis noted 5th PIP and MCP and slightly TTP no obvious deformity noted alignment good MCP all left and digits.  Mild discomfort with palpation no crepitus.  Patient to follow up tomorrow with RN Rolly SalterHaley for assist with buddy taping and [place]   Unable to see xray or provider orthopedic notes in Epic.  Avoid impact to left hand  Patient verbalized understanding information/instructions, agreed with plan of care and had no further questions at this time.

## 2018-10-20 ENCOUNTER — Encounter (HOSPITAL_COMMUNITY): Payer: Self-pay | Admitting: *Deleted

## 2018-10-20 ENCOUNTER — Inpatient Hospital Stay (HOSPITAL_COMMUNITY)
Admission: AD | Admit: 2018-10-20 | Discharge: 2018-10-20 | Disposition: A | Discharge status: Home / Self Care | Payer: No Typology Code available for payment source | Attending: Obstetrics and Gynecology | Admitting: Obstetrics and Gynecology

## 2018-10-20 DIAGNOSIS — R102 Pelvic and perineal pain: Secondary | ICD-10-CM | POA: Diagnosis present

## 2018-10-20 DIAGNOSIS — N764 Abscess of vulva: Secondary | ICD-10-CM

## 2018-10-20 DIAGNOSIS — Z880 Allergy status to penicillin: Secondary | ICD-10-CM | POA: Insufficient documentation

## 2018-10-20 DIAGNOSIS — F17210 Nicotine dependence, cigarettes, uncomplicated: Secondary | ICD-10-CM | POA: Insufficient documentation

## 2018-10-20 HISTORY — DX: Type 2 diabetes mellitus without complications: E11.9

## 2018-10-20 LAB — URINALYSIS, ROUTINE W REFLEX MICROSCOPIC
Bilirubin Urine: NEGATIVE
Glucose, UA: NEGATIVE mg/dL
Hgb urine dipstick: NEGATIVE
KETONES UR: NEGATIVE mg/dL
LEUKOCYTES UA: NEGATIVE
NITRITE: NEGATIVE
PROTEIN: NEGATIVE mg/dL
Specific Gravity, Urine: 1.01 (ref 1.005–1.030)
pH: 6 (ref 5.0–8.0)

## 2018-10-20 MED ORDER — SULFAMETHOXAZOLE-TRIMETHOPRIM 800-160 MG PO TABS: 1.0000 | ORAL_TABLET | Freq: Two times a day (BID) | ORAL | 0 refills | Status: DC

## 2018-10-20 NOTE — MAU Note (Addendum)
Robin ClasZdravka Chen is a 58 y.o.here in MAU reporting:  " bump on vagina" . Right side. Swollen, itching, and getting bigger. Onset of complaint: started 2 days ago Pain score: denies Vitals:   10/20/18 1020  BP: (!) 146/83  Pulse: (!) 106  Resp: 19  Temp: 98.1 F (36.7 C)  SpO2: 95%      Lab orders placed from triage: ua Patient declines interpreter for [name]. States she will let us know if she changes her mind.

## 2018-10-20 NOTE — MAU Provider Note (Signed)
History     CSN: [id number]  Arrival date and time: 10/20/18 1005   First Provider Initiated Contact with Patient 10/20/18 1041      Chief Complaint  Patient presents with  . vaginal pain   58 y.o. postmenopausal female presenting with vaginal bump. First noticed it about 2 days ago. Bump is painful. Has not taken anything for pain. Bump was larger today. Noticed some bloody drainage on pad after arrival here. No fevers. Reports using a new soap while on vacation recently. She is sexually active. No new partner.    OB History    Gravida  3   Para      Term      Preterm      AB      Living        SAB      TAB      Ectopic      Multiple      Live Births              Past Medical History:  Diagnosis Date  . Anemia   . Blood transfusion    2003  . Chronic kidney disease    Rt UPJ stone  . COPD (chronic obstructive pulmonary disease) (HCC)   . Diabetes mellitus without complication (HCC)   . Headache(784.0)   . Hypertension     Past Surgical History:  Procedure Laterality Date  . CYSTOSCOPY W/ URETERAL STENT PLACEMENT    . DILATION AND CURETTAGE OF UTERUS     2003    Family History  Problem Relation Age of Onset  . Cancer Mother     Social History   Tobacco Use  . Smoking status: Current Every Day Smoker    Packs/day: 1.00    Years: 30.00    Pack years: 30.00    Types: Cigarettes  . Smokeless tobacco: Never Used  Substance Use Topics  . Alcohol use: No  . Drug use: No    Allergies:  Allergies  Allergen Reactions  . Penicillins Rash and Other (See Comments)    Has patient had a PCN reaction causing immediate rash, facial/tongue/throat swelling, SOB or lightheadedness with hypotension: No Has patient had a PCN reaction causing severe rash involving mucus membranes or skin necrosis: No Has patient had a PCN reaction that required hospitalization No Has patient had a PCN reaction occurring within the last 10 years: No If all of the  above answers are "NO", then may proceed with Cephalosporin use.  . Ciprofloxacin Rash    Medications Prior to Admission  Medication Sig Dispense Refill Last Dose  . atorvastatin (LIPITOR) 20 MG tablet Take 1 tablet (20 mg total) by mouth daily. 90 tablet 3 Taking  . etodolac (LODINE XL) 400 MG 24 hr tablet Take 1 tablet by mouth daily as needed.     Marland Kitchen HYDROcodone-acetaminophen (NORCO/VICODIN) 5-325 MG tablet Take 1 tablet by mouth daily as needed.     Marland Kitchen lisinopril (PRINIVIL,ZESTRIL) 5 MG tablet Take 0.5 tablets (2.5 mg total) by mouth daily. 90 tablet 1 Taking  . metFORMIN (GLUCOPHAGE) 500 MG tablet Take 1 tablet (500 mg total) by mouth 2 (two) times daily with a meal. 180 tablet 1 Taking    Review of Systems  Constitutional: Negative for chills and fever.  Genitourinary: Positive for vaginal pain.   Physical Exam   Blood pressure (!) 146/83, pulse (!) 106, temperature 98.1 F (36.7 C), temperature source Oral, resp. rate 19, weight 90.3 kg, SpO2  95 %.  Physical Exam  Nursing note and vitals reviewed. Constitutional: She appears well-developed and well-nourished. No distress.  HENT:  Head: Normocephalic and atraumatic.  Neck: Normal range of motion.  Respiratory: No respiratory distress.  Genitourinary:      MAU Course  Procedures  MDM Labial abscess identified. No I&D indicated since already draining. Will start Bactrim and sitz bath TID. Stable for discharge home.  Assessment and Plan   1. Labial abscess    Discharge home Follow up in [facility] in 2 weeks, sooner if not improving- message sent Rx Bactrim Ibuprofen prn  Allergies as of 10/20/2018      Reactions   Penicillins Rash, Other (See Comments)   Has patient had a PCN reaction causing immediate rash, facial/tongue/throat swelling, SOB or lightheadedness with hypotension: No Has patient had a PCN reaction causing severe rash involving mucus membranes or skin necrosis: No Has patient had a PCN reaction that  required hospitalization No Has patient had a PCN reaction occurring within the last 10 years: No If all of the above answers are "NO", then may proceed with Cephalosporin use.   Ciprofloxacin Rash      Medication List    TAKE these medications   atorvastatin 20 MG tablet Commonly known as:  LIPITOR Take 1 tablet (20 mg total) by mouth daily.   etodolac 400 MG 24 hr tablet Commonly known as:  LODINE XL Take 1 tablet by mouth daily as needed.   HYDROcodone-acetaminophen 5-325 MG tablet Commonly known as:  NORCO/VICODIN Take 1 tablet by mouth daily as needed.   lisinopril 5 MG tablet Commonly known as:  PRINIVIL,ZESTRIL Take 0.5 tablets (2.5 mg total) by mouth daily.   metFORMIN 500 MG tablet Commonly known as:  GLUCOPHAGE Take 1 tablet (500 mg total) by mouth 2 (two) times daily with a meal.   sulfamethoxazole-trimethoprim 800-160 MG tablet Commonly known as:  BACTRIM DS,SEPTRA DS Take 1 tablet by mouth 2 (two) times daily.      Donette Larry, CNM 10/20/2018, 10:52 AM

## 2018-10-20 NOTE — Discharge Instructions (Signed)

## 2018-11-05 ENCOUNTER — Ambulatory Visit (INDEPENDENT_AMBULATORY_CARE_PROVIDER_SITE_OTHER): Payer: No Typology Code available for payment source | Admitting: Obstetrics & Gynecology

## 2018-11-05 ENCOUNTER — Encounter: Payer: Self-pay | Admitting: Obstetrics & Gynecology

## 2018-11-05 VITALS — BP 155/84 | HR 108 | Ht 64.0 in | Wt 198.6 lb

## 2018-11-05 DIAGNOSIS — N764 Abscess of vulva: Secondary | ICD-10-CM | POA: Diagnosis not present

## 2018-11-05 DIAGNOSIS — Z0000 Encounter for general adult medical examination without abnormal findings: Secondary | ICD-10-CM

## 2018-11-05 NOTE — Progress Notes (Signed)
   Subjective:    Patient ID: Robin Chen, female    DOB: [birth year], 59 y.o.   MRN: [id number]  HPI 58 yo lady here for follow up after a MAU visit about 2 weeks ago for a right labia majora draining abscess. She was given bactrim and sitz baths were recommended. She comes in today very happy, the area has almost completely resolved. She is not having any pain or other complaints.   Review of Systems Pap smear normal 3/19    Objective:   Physical Exam Breathing, conversing, and ambulating normally Well nourished, well hydrated White female, no apparent distress Her vulva appears normal with the exception of a small (about 5 mm) hardened non-tender area on her lower right labia majora      Assessment & Plan:  H/o vulvar abscess- now resolved Preventative care - mammogram ordered

## 2018-12-05 ENCOUNTER — Ambulatory Visit: Payer: Self-pay

## 2019-01-06 ENCOUNTER — Ambulatory Visit: Payer: Self-pay

## 2019-06-24 ENCOUNTER — Ambulatory Visit: Payer: Self-pay | Admitting: *Deleted

## 2019-06-24 ENCOUNTER — Other Ambulatory Visit: Payer: Self-pay

## 2019-06-24 VITALS — BP 151/105 | HR 120

## 2019-06-24 DIAGNOSIS — M5442 Lumbago with sciatica, left side: Secondary | ICD-10-CM

## 2019-06-24 MED ORDER — CYCLOBENZAPRINE HCL 10 MG PO TABS: 10.0000 mg | ORAL_TABLET | Freq: Three times a day (TID) | ORAL | 0 refills | Status: DC | PRN

## 2019-06-24 MED ORDER — IBUPROFEN 800 MG PO TABS: 800.0000 mg | ORAL_TABLET | Freq: Three times a day (TID) | ORAL | 0 refills | Status: DC | PRN

## 2019-06-24 NOTE — Progress Notes (Signed)
Pt ambulatory to clinic. Approaches with L hand over L flank and limping, favoring L leg. She reports since Sunday she has had L lower back pain radiating into L leg down to calf. Sts pain is sharp, shooting. Denies numbness/tingling, loss of bowel or bladder control, saddle paresthesias, foot drop.   Sts she bent over to pick up something at home on Sunday and felt a pull in her back that has progressively worsened. She had something similar occur June 2019 per chart review and was Rx'd Motrin, Flexeril, Tylenol.  She has taken Ibuprofen at home. Last dose 600mg  at 1100 today. She also has a Ther[employer]are patch applied to lower back that she has received no relief from yet. Left lower back, SI joint TTP, active spasm evident upon exam.  Due to previous fill of Flexeril in clinic in 2019 and NP out of clinic until Thursday, external refill placed to pharmacy of choice, Flexeril 10mg  1/2 to 1 tablet TID po prn muscle spams #30 RF0. Pt requests Ibuprofen 800mg  po TID prn pain #30 RF0 from pharmacy as well. Advised pt no driving while taking flexeril due to drowsiness potential. Avoid alcohol intake. Continue thermacare patches prn. Given Tylenol 1000mg  po q6-8hours prn pain from clinic stock with instructions to continue alternating with Ibuprofen if not well controlled with Ibuprofen and Flexeril. Also encouraged professional or self massage for muscle spasm. BP and pulse increased in clinic despite rest and sitting. Asked pt to return to clinic Thursday or Friday afternoon for VS recheck.   She verbalizes understanding and agreement with plan of care.

## 2019-06-26 ENCOUNTER — Telehealth: Payer: Self-pay | Admitting: Registered Nurse

## 2019-06-26 ENCOUNTER — Encounter: Payer: Self-pay | Admitting: Registered Nurse

## 2019-06-26 NOTE — Telephone Encounter (Signed)
Reviewed RN Cheri Guppy note.  Agreed with plan of care for acute back pain.  Patient coming to clinic for re-evaluation today.  Flexeril 10mg  take 1/2-1 tab po TID prn muscle spasms #30 RF0 electronic Rx to her pharmacy of choice and motrin 800mg  po TID prn pain #30 RF0 take with food.  Patient to have slow position changes as flexeril can lower blood pressure and cause sedation/drowsiness.  Patient has taken in the past without problems.  Caution patient no alcohol while taking flexeril and avoid driving for 8 hours after taking flexeril.

## 2019-09-30 ENCOUNTER — Encounter: Payer: Self-pay | Admitting: Registered Nurse

## 2019-09-30 ENCOUNTER — Ambulatory Visit: Payer: Self-pay | Admitting: Registered Nurse

## 2019-09-30 ENCOUNTER — Other Ambulatory Visit: Payer: Self-pay

## 2019-09-30 VITALS — BP 140/92 | HR 112 | Temp 98.4°F | Resp 16 | Ht 64.0 in | Wt 167.0 lb

## 2019-09-30 DIAGNOSIS — I10 Essential (primary) hypertension: Secondary | ICD-10-CM

## 2019-09-30 DIAGNOSIS — Z8639 Personal history of other endocrine, nutritional and metabolic disease: Secondary | ICD-10-CM

## 2019-09-30 DIAGNOSIS — R634 Abnormal weight loss: Secondary | ICD-10-CM

## 2019-09-30 DIAGNOSIS — F17218 Nicotine dependence, cigarettes, with other nicotine-induced disorders: Secondary | ICD-10-CM

## 2019-09-30 DIAGNOSIS — E119 Type 2 diabetes mellitus without complications: Secondary | ICD-10-CM

## 2019-09-30 DIAGNOSIS — Z6828 Body mass index (BMI) 28.0-28.9, adult: Secondary | ICD-10-CM

## 2019-09-30 DIAGNOSIS — E559 Vitamin D deficiency, unspecified: Secondary | ICD-10-CM

## 2019-09-30 NOTE — Patient Instructions (Addendum)
Diabetes Basics  Diabetes (diabetes mellitus) is a long-term (chronic) disease. It occurs when the body does not properly use sugar (glucose) that is released from food after you eat. Diabetes may be caused by one or both of these problems:  Your pancreas does not make enough of a hormone called insulin.  Your body does not react in a normal way to insulin that it makes. Insulin lets sugars (glucose) go into cells in your body. This gives you energy. If you have diabetes, sugars cannot get into cells. This causes high blood sugar (hyperglycemia). Follow these instructions at home: How is diabetes treated? You may need to take insulin or other diabetes medicines daily to keep your blood sugar in balance. Take your diabetes medicines every day as told by your doctor. List your diabetes medicines here: How do I manage my blood sugar?  Check your blood sugar levels using a blood glucose monitor as directed by your doctor. Your doctor will set treatment goals for you. Generally, you should have these blood sugar levels:  Before meals (preprandial): 80-130 mg/dL (0.9-8.1 mmol/L).  After meals (postprandial): below 180 mg/dL (10 mmol/L).  A1c level: less than 7%. Write down the times that you will check your blood sugar levels: Blood sugar checks  Time: _______________ Notes: ___________________________________  Time: _______________ Notes: ___________________________________  Time: _______________ Notes: ___________________________________  Time: _______________ Notes: ___________________________________  Time: _______________ Notes: ___________________________________  Time: _______________ Notes: ___________________________________  What do I need to know about low blood sugar? Low blood sugar is called hypoglycemia. This is when blood sugar is at or below 70 mg/dL (3.9 mmol/L). Symptoms may include:  Feeling: ? Hungry. ? Worried or nervous (anxious). ? Sweaty and clammy. ?  Confused. ? Dizzy. ? Sleepy. ? Sick to your stomach (nauseous).  Having: ? A fast heartbeat. ? A headache. ? A change in your vision. ? Tingling or no feeling (numbness) around the mouth, lips, or tongue. ? Jerky movements that you cannot control (seizure).  Having trouble with: ? Moving (coordination). ? Sleeping. ? Passing out (fainting). ? Getting upset easily (irritability). Treating low blood sugar To treat low blood sugar, eat or drink something sugary right away. If you can think clearly and swallow safely, follow the 15:15 rule:  Take 15 grams of a fast-acting carb (carbohydrate). Talk with your doctor about how much you should take.  Some fast-acting carbs are: ? Sugar tablets (glucose pills). Take 3-4 glucose pills. ? 6-8 pieces of hard candy. ? 4-6 oz (120-150 mL) of fruit juice. ? 4-6 oz (120-150 mL) of regular (not diet) soda. ? 1 Tbsp (15 mL) honey or sugar.  Check your blood sugar 15 minutes after you take the carb.  If your blood sugar is still at or below 70 mg/dL (3.9 mmol/L), take 15 grams of a carb again.  If your blood sugar does not go above 70 mg/dL (3.9 mmol/L) after 3 tries, get help right away.  After your blood sugar goes back to normal, eat a meal or a snack within 1 hour. Treating very low blood sugar If your blood sugar is at or below 54 mg/dL (3 mmol/L), you have very low blood sugar (severe hypoglycemia). This is an emergency. Do not wait to see if the symptoms will go away. Get medical help right away. Call your local emergency services (911 in the [place] Do not drive yourself to the hospital. Questions to ask your health care provider  Do I need to meet with a diabetes  educator?  What equipment will I need to care for myself at home?  What diabetes medicines do I need? When should I take them?  How often do I need to check my blood sugar?  What number can I call if I have questions?  When is my next doctor's visit?  Where can I  find a support group for people with diabetes? Where to find more information  American Diabetes Association: www.diabetes.org  American Association of Diabetes Educators: www.diabeteseducator.org/patient-resources Contact a doctor if:  Your blood sugar is at or above 240 mg/dL (08.6 mmol/L) for 2 days in a row.  You have been sick or have had a fever for 2 days or more, and you are not getting better.  You have any of these problems for more than 6 hours: ? You cannot eat or drink. ? You feel sick to your stomach (nauseous). ? You throw up (vomit). ? You have watery poop (diarrhea). Get help right away if:  Your blood sugar is lower than 54 mg/dL (3 mmol/L).  You get confused.  You have trouble: ? Thinking clearly. ? Breathing. Summary  Diabetes (diabetes mellitus) is a long-term (chronic) disease. It occurs when the body does not properly use sugar (glucose) that is released from food after digestion.  Take insulin and diabetes medicines as told.  Check your blood sugar every day, as often as told.  Keep all follow-up visits as told by your doctor. This is important. This information is not intended to replace advice given to you by your health care provider. Make sure you discuss any questions you have with your health care provider. Document Revised: 05/21/2019 Document Reviewed: 11/30/2017 Elsevier Patient Education  2020 Else[employer] Inc. Diabetes Mellitus and Nutrition, Adult When you have diabetes (diabetes mellitus), it is very important to have healthy eating habits because your blood sugar (glucose) levels are greatly affected by what you eat and drink. Eating healthy foods in the appropriate amounts, at about the same times every day, can help you:  Control your blood glucose.  Lower your risk of heart disease.  Improve your blood pressure.  Reach or maintain a healthy weight. Every person with diabetes is different, and each person has different needs for a  meal plan. Your health care provider may recommend that you work with a diet and nutrition specialist (dietitian) to make a meal plan that is best for you. Your meal plan may vary depending on factors such as:  The calories you need.  The medicines you take.  Your weight.  Your blood glucose, blood pressure, and cholesterol levels.  Your activity level.  Other health conditions you have, such as heart or kidney disease. How do carbohydrates affect me? Carbohydrates, also called carbs, affect your blood glucose level more than any other type of food. Eating carbs naturally raises the amount of glucose in your blood. Carb counting is a method for keeping track of how many carbs you eat. Counting carbs is important to keep your blood glucose at a healthy level, especially if you use insulin or take certain oral diabetes medicines. It is important to know how many carbs you can safely have in each meal. This is different for every person. Your dietitian can help you calculate how many carbs you should have at each meal and for each snack. Foods that contain carbs include:  Bread, cereal, rice, pasta, and crackers.  Potatoes and corn.  Peas, beans, and lentils.  Milk and yogurt.  Fruit and juice.  Desserts, such as cakes, cookies, ice cream, and candy. How does alcohol affect me? Alcohol can cause a sudden decrease in blood glucose (hypoglycemia), especially if you use insulin or take certain oral diabetes medicines. Hypoglycemia can be a life-threatening condition. Symptoms of hypoglycemia (sleepiness, dizziness, and confusion) are similar to symptoms of having too much alcohol. If your health care provider says that alcohol is safe for you, follow these guidelines:  Limit alcohol intake to no more than 1 drink per day for nonpregnant women and 2 drinks per day for men. One drink equals 12 oz of beer, 5 oz of wine, or 1 oz of hard liquor.  Do not drink on an empty stomach.  Keep  yourself hydrated with water, diet soda, or unsweetened iced tea.  Keep in mind that regular soda, juice, and other mixers may contain a lot of sugar and must be counted as carbs. What are tips for following this plan?  Reading food labels  Start by checking the serving size on the "Nutrition Facts" label of packaged foods and drinks. The amount of calories, carbs, fats, and other nutrients listed on the label is based on one serving of the item. Many items contain more than one serving per package.  Check the total grams (g) of carbs in one serving. You can calculate the number of servings of carbs in one serving by dividing the total carbs by 15. For example, if a food has 30 g of total carbs, it would be equal to 2 servings of carbs.  Check the number of grams (g) of saturated and trans fats in one serving. Choose foods that have low or no amount of these fats.  Check the number of milligrams (mg) of salt (sodium) in one serving. Most people should limit total sodium intake to less than 2,300 mg per day.  Always check the nutrition information of foods labeled as "low-fat" or "nonfat". These foods may be higher in added sugar or refined carbs and should be avoided.  Talk to your dietitian to identify your daily goals for nutrients listed on the label. Shopping  Avoid buying canned, premade, or processed foods. These foods tend to be high in fat, sodium, and added sugar.  Shop around the outside edge of the grocery store. This includes fresh fruits and vegetables, bulk grains, fresh meats, and fresh dairy. Cooking  Use low-heat cooking methods, such as baking, instead of high-heat cooking methods like deep frying.  Cook using healthy oils, such as olive, canola, or sunflower oil.  Avoid cooking with butter, cream, or high-fat meats. Meal planning  Eat meals and snacks regularly, preferably at the same times every day. Avoid going long periods of time without eating.  Eat foods  high in fiber, such as fresh fruits, vegetables, beans, and whole grains. Talk to your dietitian about how many servings of carbs you can eat at each meal.  Eat 4-6 ounces (oz) of lean protein each day, such as lean meat, chicken, fish, eggs, or tofu. One oz of lean protein is equal to: ? 1 oz of meat, chicken, or fish. ? 1 egg. ?  cup of tofu.  Eat some foods each day that contain healthy fats, such as avocado, nuts, seeds, and fish. Lifestyle  Check your blood glucose regularly.  Exercise regularly as told by your health care provider. This may include: ? 150 minutes of moderate-intensity or vigorous-intensity exercise each week. This could be brisk walking, biking, or water aerobics. ? Stretching and doing  strength exercises, such as yoga or weightlifting, at least 2 times a week.  Take medicines as told by your health care provider.  Do not use any products that contain nicotine or tobacco, such as cigarettes and e-cigarettes. If you need help quitting, ask your health care provider.  Work with a Social worker or diabetes educator to identify strategies to manage stress and any emotional and social challenges. Questions to ask a health care provider  Do I need to meet with a diabetes educator?  Do I need to meet with a dietitian?  What number can I call if I have questions?  When are the best times to check my blood glucose? Where to find more information:  American Diabetes Association: diabetes.org  Academy of Nutrition and Dietetics: www.eatright.[employer] of Diabetes and Digestive and Kidney Diseases (NIH): [employer] Summary  A healthy meal plan will help you control your blood glucose and maintain a healthy lifestyle.  Working with a diet and nutrition specialist (dietitian) can help you make a meal plan that is best for you.  Keep in mind that carbohydrates (carbs) and alcohol have immediate effects on your blood glucose levels. It is important  to count carbs and to use alcohol carefully. This information is not intended to replace advice given to you by your health care provider. Make sure you discuss any questions you have with your health care provider. Document Revised: 08/10/2017 Document Reviewed: 10/02/2016 Elsevier Patient Education  2020 [employer]. Managing Your Hypertension Hypertension is commonly called high blood pressure. This is when the force of your blood pressing against the walls of your arteries is too strong. Arteries are blood vessels that carry blood from your heart throughout your body. Hypertension forces the heart to work harder to pump blood, and may cause the arteries to become narrow or stiff. Having untreated or uncontrolled hypertension can cause heart attack, stroke, kidney disease, and other problems. What are blood pressure readings? A blood pressure reading consists of a higher number over a lower number. Ideally, your blood pressure should be below 120/80. The first ("top") number is called the systolic pressure. It is a measure of the pressure in your arteries as your heart beats. The second ("bottom") number is called the diastolic pressure. It is a measure of the pressure in your arteries as the heart relaxes. What does my blood pressure reading mean? Blood pressure is classified into four stages. Based on your blood pressure reading, your health care provider may use the following stages to determine what type of treatment you need, if any. Systolic pressure and diastolic pressure are measured in a unit called mm Hg. Normal  Systolic pressure: below 854.  Diastolic pressure: below 80. Elevated  Systolic pressure: 627-035.  Diastolic pressure: below 80. Hypertension stage 1  Systolic pressure: 009-381.  Diastolic pressure: 82-99. Hypertension stage 2  Systolic pressure: 371 or above.  Diastolic pressure: 90 or above. What health risks are associated with hypertension? Managing your  hypertension is an important responsibility. Uncontrolled hypertension can lead to:  A heart attack.  A stroke.  A weakened blood vessel (aneurysm).  Heart failure.  Kidney damage.  Eye damage.  Metabolic syndrome.  Memory and concentration problems. What changes can I make to manage my hypertension? Hypertension can be managed by making lifestyle changes and possibly by taking medicines. Your health care provider will help you make a plan to bring your blood pressure within a normal range. Eating and drinking   Eat a  diet that is high in fiber and potassium, and low in salt (sodium), added sugar, and fat. An example eating plan is called the DASH (Dietary Approaches to Stop Hypertension) diet. To eat this way: ? Eat plenty of fresh fruits and vegetables. Try to fill half of your plate at each meal with fruits and vegetables. ? Eat whole grains, such as whole wheat pasta, brown rice, or whole grain bread. Fill about one quarter of your plate with whole grains. ? Eat low-fat diary products. ? Avoid fatty cuts of meat, processed or cured meats, and poultry with skin. Fill about one quarter of your plate with lean proteins such as fish, chicken without skin, beans, eggs, and tofu. ? Avoid premade and processed foods. These tend to be higher in sodium, added sugar, and fat.  Reduce your daily sodium intake. Most people with hypertension should eat less than 1,500 mg of sodium a day.  Limit alcohol intake to no more than 1 drink a day for nonpregnant women and 2 drinks a day for men. One drink equals 12 oz of beer, 5 oz of wine, or 1 oz of hard liquor. Lifestyle  Work with your health care provider to maintain a healthy body weight, or to lose weight. Ask what an ideal weight is for you.  Get at least 30 minutes of exercise that causes your heart to beat faster (aerobic exercise) most days of the week. Activities may include walking, swimming, or biking.  Include exercise to  strengthen your muscles (resistance exercise), such as weight lifting, as part of your weekly exercise routine. Try to do these types of exercises for 30 minutes at least 3 days a week.  Do not use any products that contain nicotine or tobacco, such as cigarettes and e-cigarettes. If you need help quitting, ask your health care provider.  Control any long-term (chronic) conditions you have, such as high cholesterol or diabetes. Monitoring  Monitor your blood pressure at home as told by your health care provider. Your personal target blood pressure may vary depending on your medical conditions, your age, and other factors.  Have your blood pressure checked regularly, as often as told by your health care provider. Working with your health care provider  Review all the medicines you take with your health care provider because there may be side effects or interactions.  Talk with your health care provider about your diet, exercise habits, and other lifestyle factors that may be contributing to hypertension.  Visit your health care provider regularly. Your health care provider can help you create and adjust your plan for managing hypertension. Will I need medicine to control my blood pressure? Your health care provider may prescribe medicine if lifestyle changes are not enough to get your blood pressure under control, and if:  Your systolic blood pressure is 130 or higher.  Your diastolic blood pressure is 80 or higher. Take medicines only as told by your health care provider. Follow the directions carefully. Blood pressure medicines must be taken as prescribed. The medicine does not work as well when you skip doses. Skipping doses also puts you at risk for problems. Contact a health care provider if:  You think you are having a reaction to medicines you have taken.  You have repeated (recurrent) headaches.  You feel dizzy.  You have swelling in your ankles.  You have trouble with your  vision. Get help right away if:  You develop a severe headache or confusion.  You have unusual weakness  or numbness, or you feel faint.  You have severe pain in your chest or abdomen.  You vomit repeatedly.  You have trouble breathing. Summary  Hypertension is when the force of blood pumping through your arteries is too strong. If this condition is not controlled, it may put you at risk for serious complications.  Your personal target blood pressure may vary depending on your medical conditions, your age, and other factors. For most people, a normal blood pressure is less than 120/80.  Hypertension is managed by lifestyle changes, medicines, or both. Lifestyle changes include weight loss, eating a healthy, low-sodium diet, exercising more, and limiting alcohol. This information is not intended to replace advice given to you by your health care provider. Make sure you discuss any questions you have with your health care provider. Document Revised: 12/20/2018 Document Reviewed: 07/26/2016 Elsevier Patient Education  2020 Elsevier Inc.  Preventing Health Risks of Being Overweight Maintaining a healthy body weight is an important part of your overall health. Your healthy body weight depends on your age, gender, and height. Being overweight puts you at risk for many health problems, including:  Heart disease.  Diabetes.  Problems sleeping.  Joint problems. You can make changes to your diet and lifestyle to prevent these risks. Consider working with a health care provider or a dietitian to make these changes. What nutrition changes can be made?   Eat only as much as your body needs. In most cases, this is about 2,000 calories a day, but the amount varies depending on your height, gender, and activity level. Ask your health care provider how many calories you should have each day. Eating more than your body needs on a regular basis can cause you to become overweight or obese.  Eat  slowly, and stop eating when you feel full.  Choose healthy foods, including: ? Fruits and vegetables. ? Lean meats. ? Low-fat dairy products. ? High-fiber foods, such as whole grains and beans. ? Healthy snacks like vegetable sticks, a piece of fruit, or a small amount of yogurt or cheese.  Avoid foods and drinks that are high in sugar, salt (sodium), saturated fat, or trans fat. This includes: ? Many desserts such as candy, cookies, and ice cream. ? Soda. ? Fried foods. ? Processed meats such as hot dogs or lunch meats. ? Prepackaged snack foods. What lifestyle changes can be made?   Exercise for at least 150 minutes a week to prevent weight gain, or as often as recommended by your health care provider. Do moderate-intensity exercise, such as brisk walking. ? Spread it out by exercising for 30 minutes 5 days a week, or in short 10-minute bursts several times a day.  Find other ways to stay active and burn calories, such as yard work or a hobby that involves physical activity.  Get at least 8 hours of sleep each night. When you are well-rested, you are more likely to be active and make healthy choices during the day. To sleep better: ? Try to go to bed and wake up at about the same time every day. ? Keep your bedroom dark, quiet, and cool. ? Make sure that your bed is comfortable. ? Avoid stimulating activities, such as watching television or exercising, for at least one hour before bedtime. Why are these changes important? Eating healthy and being active helps you lose weight and prevent health problems caused by being overweight. Making these changes can also help you manage stress, feel better mentally, and connect with  friends and family. What can happen if changes are not made? Being overweight can affect you for your entire life. You may develop joint or bone problems that make it painful or difficult for you to play sports or do activities you enjoy. Being overweight puts  stress on your heart and lungs and can lead to medical problems like diabetes, heart disease, and sleeping problems. Where to find support You can get support for preventing health risks of being overweight from:  Your health care provider or a dietitian. They can provide guidance about healthy eating and healthy lifestyle choices.  Weight loss support groups, online or in-person. Where to find more information  MyPlate: https://ball-collins.biz/ ? This an online tool that provides personalized recommendations about foods to eat each day.  The Centers for Disease Control and Prevention: [employer]Scrapbook.gl ? This resource gives tips for managing weight and having an active lifestyle. Summary  To prevent unhealthy weight gain, it is important to maintain a healthy diet high in vegetables and whole grains, exercise regularly, and get at least 8 hours of sleep each night.  Making these changes helps prevent many long-term (chronic) health conditions that can shorten your life, such as diabetes, heart disease, and stroke. This information is not intended to replace advice given to you by your health care provider. Make sure you discuss any questions you have with your health care provider. Document Revised: 05/21/2019 Document Reviewed: 07/25/2017 Elsevier Patient Education  2020 Elsevier Inc.  Tobacco Use Disorder Tobacco use disorder (TUD) occurs when a person craves, seeks, and uses tobacco, regardless of the consequences. This disorder can cause problems with mental and physical health. It can affect your ability to have healthy relationships, and it can keep you from meeting your responsibilities at work, home, or school. Tobacco may be:  Smoked as a cigarette or cigar.  Inhaled using e-cigarettes.  Smoked in a pipe or hookah.  Chewed as smokeless tobacco.  Inhaled into the nostrils as snuff. Tobacco products contain a dangerous chemical called nicotine, which is very  addictive. Nicotine triggers hormones that make the body feel stimulated and works on areas of the brain that make you feel good. These effects can make it hard for people to quit nicotine. Tobacco contains many other unsafe chemicals that can damage almost every organ in the body. Smoking tobacco also puts others in danger due to fire risk and possible health problems caused by breathing in secondhand smoke. What are the signs or symptoms? Symptoms of TUD may include:  Being unable to slow down or stop your tobacco use.  Spending an abnormal amount of time getting or using tobacco.  Craving tobacco. Cravings may last for up to 6 months after quitting.  Tobacco use that: ? Interferes with your work, school, or home life. ? Interferes with your personal and social relationships. ? Makes you give up activities that you once enjoyed or found important.  Using tobacco even though you know that it is: ? Dangerous or bad for your health or someone else's health. ? Causing problems in your life.  Needing more and more of the substance to get the same effect (developing tolerance).  Experiencing unpleasant symptoms if you do not use the substance (withdrawal). Withdrawal symptoms may include: ? Depressed, anxious, or irritable mood. ? Difficulty concentrating. ? Increased appetite. ? Restlessness or trouble sleeping.  Using the substance to avoid withdrawal. How is this diagnosed? This condition may be diagnosed based on:  Your current and past tobacco use.  Your health care provider may ask questions about how your tobacco use affects your life.  A physical exam. You may be diagnosed with TUD if you have at least two symptoms within a 8970-month period. How is this treated? This condition is treated by stopping tobacco use. Many people are unable to quit on their own and need help. Treatment may include:  Nicotine replacement therapy (NRT). NRT provides nicotine without the other harmful  chemicals in tobacco. NRT gradually lowers the dosage of nicotine in the body and reduces withdrawal symptoms. NRT is available as: ? Over-the-counter gums, lozenges, and skin patches. ? Prescription mouth inhalers and nasal sprays.  Medicine that acts on the brain to reduce cravings and withdrawal symptoms.  A type of talk therapy that examines your triggers for tobacco use, how to avoid them, and how to cope with cravings (behavioral therapy).  Hypnosis. This may help with withdrawal symptoms.  Joining a support group for others coping with TUD. The best treatment for TUD is usually a combination of medicine, talk therapy, and support groups. Recovery can be a long process. Many people start using tobacco again after stopping (relapse). If you relapse, it does not mean that treatment will not work. Follow these instructions at home:  Lifestyle  Do not use any products that contain nicotine or tobacco, such as cigarettes and e-cigarettes.  Avoid things that trigger tobacco use as much as you can. Triggers include people and situations that usually cause you to use tobacco.  Avoid drinks that contain caffeine, including coffee. These may worsen some withdrawal symptoms.  Find ways to manage stress. Wanting to smoke may cause stress, and stress can make you want to smoke. Relaxation techniques such as deep breathing, meditation, and yoga may help.  Attend support groups as needed. These groups are an important part of long-term recovery for many people. General instructions  Take over-the-counter and prescription medicines only as told by your health care provider.  Check with your health care provider before taking any new prescription or over-the-counter medicines.  Decide on a friend, family member, or smoking quit-line (such as 1-800-QUIT-NOW in the [place] that you can call or text when you feel the urge to smoke or when you need help coping with cravings.  Keep all follow-up  visits as told by your health care provider and therapist. This is important. Contact a health care provider if:  You are not able to take your medicines as prescribed.  Your symptoms get worse, even with treatment. Summary  Tobacco use disorder (TUD) occurs when a person craves, seeks, and uses tobacco regardless of the consequences.  This condition may be diagnosed based on your current and past tobacco use and a physical exam.  Many people are unable to quit on their own and need help. Recovery can be a long process.  The most effective treatment for TUD is usually a combination of medicine, talk therapy, and support groups. This information is not intended to replace advice given to you by your health care provider. Make sure you discuss any questions you have with your health care provider. Document Revised: 08/15/2017 Document Reviewed: 08/15/2017 Elsevier Patient Education  2020 [employer]

## 2019-09-30 NOTE — Progress Notes (Signed)
Subjective:    Patient ID: Robin Chen, female    DOB: 10/09/1960, 59 y.o.   MRN: [id number]  58y/o Caucasian married female pt c/o unintentional wt loss over past 3 months. She went out of work in mid Oct 2020 due to back pain/sciatica flare. Was on med leave for approx 6 weeks. Had decreased appetite and energy levels at that time. Some expected wt loss occurred then. But she reports feeling back to normal appetite and activity by the time she returned to work late November. Reports a total wt loss of 22# by home scale in 3 months, and estimates 12# in the last month. Has no other complaints such as anorexia, n/v/d, night sweats, ShOB, cough, changes in stool, blood in urine/stool or tarry/coffee ground stools. States her sugars have been "up and down" reporting most recent readings of 120 and 176. No episodes of symptomatic hypo- or hyperglycemia. Weight had been stable 198 since 2015 per chart review  Last annual with Gyn 05 Nov 2018 had not scheduled mammogram due to covid pandemic order still active through Apr 2021 per epic review.  Patient typically skips lunch eats lunch and dinner and snack after dinner cooks meals for herself.  Eats leftovers or sandwich and salad.  PMHx dyslipidemia, fatty liver, vitamin D deficiency, type II diabetes, kidney stones, lumbar DDD/DJD moderate canal stenosis L3-4 per MRI results in Epic.  Last labs 2019 per chart review Hgba1c/CBC stable.  Reported was not eating or sleeping well and not working for a couple months due to back pain just returned to work.  Feeling good denies any pain now, swelling hands/feet, rashes, dysphasia, dyspnea, sob, chest pain.  States she has been stressed unable to meet her second grandchild-boy 24 months old now lives in [place] and having to maintain social distancing.  Patient did not eat breakfast only 1 cup coffee 8 oz with splenda this morning so far.     Review of Systems  Constitutional: Positive for fatigue. Negative for  activity change, appetite change, chills, diaphoresis and fever.  HENT: Negative for ear discharge, ear pain, postnasal drip, rhinorrhea, sinus pressure, sinus pain, sneezing, sore throat, trouble swallowing and voice change.   Eyes: Negative for photophobia, pain, discharge, redness, itching and visual disturbance.  Respiratory: Negative for cough, choking, chest tightness, shortness of breath, wheezing and stridor.   Cardiovascular: Negative for chest pain, palpitations and leg swelling.  Gastrointestinal: Negative for abdominal distention, abdominal pain, blood in stool, constipation, diarrhea, nausea and vomiting.  Endocrine: Negative for cold intolerance and heat intolerance.  Genitourinary: Negative for difficulty urinating, flank pain and hematuria.  Musculoskeletal: Negative for back pain, gait problem, joint swelling, neck pain and neck stiffness.  Skin: Negative for rash.  Allergic/Immunologic: Positive for immunocompromised state. Negative for environmental allergies and food allergies.  Neurological: Negative for dizziness, tremors, seizures, syncope, facial asymmetry, speech difficulty, weakness, light-headedness, numbness and headaches.  Hematological: Negative for adenopathy. Does not bruise/bleed easily.  Psychiatric/Behavioral: Negative for agitation, confusion and sleep disturbance.       Objective:   Physical Exam Vitals and nursing note reviewed.  Constitutional:      General: She is awake. She is not in acute distress.    Appearance: Normal appearance. She is well-developed, well-groomed and overweight. She is not ill-appearing, toxic-appearing or diaphoretic.  HENT:     Head: Normocephalic and atraumatic.     Jaw: There is normal jaw occlusion. No trismus.     Salivary Glands: Right salivary gland  is not diffusely enlarged or tender. Left salivary gland is not diffusely enlarged or tender.     Right Ear: Hearing, ear canal and external ear normal. A middle ear  effusion is present. There is no impacted cerumen.     Left Ear: Hearing, ear canal and external ear normal. A middle ear effusion is present. There is no impacted cerumen.     Nose: No nasal deformity, septal deviation, signs of injury, laceration, nasal tenderness, mucosal edema, congestion or rhinorrhea.     Right Turbinates: Not enlarged or swollen.     Left Turbinates: Not enlarged or swollen.     Right Sinus: No maxillary sinus tenderness or frontal sinus tenderness.     Left Sinus: No maxillary sinus tenderness or frontal sinus tenderness.     Mouth/Throat:     Lips: Pink. No lesions.     Mouth: Mucous membranes are moist. Mucous membranes are not pale, not dry and not cyanotic. No injury, lacerations, oral lesions or angioedema.     Dentition: No gingival swelling, dental caries, dental abscesses or gum lesions.     Tongue: No lesions. Tongue does not deviate from midline.     Palate: No mass and lesions.     Pharynx: Uvula midline. Pharyngeal swelling and posterior oropharyngeal erythema present. No oropharyngeal exudate or uvula swelling.     Tonsils: No tonsillar exudate or tonsillar abscesses.     Comments: Cobblestoning posterior pharynx; bilateral allergic shiners; bilateral TMs air fluid level clear Eyes:     General: Lids are normal. Vision grossly intact. Gaze aligned appropriately. Allergic shiner present. No visual field deficit or scleral icterus.       Right eye: No foreign body, discharge or hordeolum.        Left eye: No foreign body, discharge or hordeolum.     Extraocular Movements: Extraocular movements intact.     Right eye: Normal extraocular motion and no nystagmus.     Left eye: Normal extraocular motion and no nystagmus.     Conjunctiva/sclera: Conjunctivae normal.     Right eye: Right conjunctiva is not injected. No chemosis, exudate or hemorrhage.    Left eye: Left conjunctiva is not injected. No chemosis, exudate or hemorrhage.    Pupils: Pupils are equal,  round, and reactive to light. Pupils are equal.     Right eye: Pupil is round and reactive.     Left eye: Pupil is round and reactive.  Neck:     Thyroid: No thyroid mass, thyromegaly or thyroid tenderness.     Trachea: Trachea and phonation normal. No tracheal tenderness or tracheal deviation.  Cardiovascular:     Rate and Rhythm: Regular rhythm. Tachycardia present.     Chest Wall: PMI is not displaced.     Pulses: Normal pulses.          Radial pulses are 2+ on the right side and 2+ on the left side.     Heart sounds: Normal heart sounds, S1 normal and S2 normal. No murmur. No friction rub. No gallop.      Comments: Rechecked pulse after exam/blood draw unchanged 110s.   Pulmonary:     Effort: Pulmonary effort is normal. No accessory muscle usage or respiratory distress.     Breath sounds: Normal breath sounds and air entry. No stridor, decreased air movement or transmitted upper airway sounds. No decreased breath sounds, wheezing, rhonchi or rales.     Comments: Patient wearing cloth mask due to covid 19 pandemic; spoke  full sentences without difficulty; no cough observed in exam room Chest:     Chest wall: No tenderness.  Abdominal:     General: Bowel sounds are decreased. There is no distension.     Palpations: Abdomen is soft. There is no shifting dullness, fluid wave, hepatomegaly, splenomegaly, mass or pulsatile mass.     Tenderness: There is no abdominal tenderness. There is no right CVA tenderness, left CVA tenderness, guarding or rebound. Negative signs include Murphy's sign.     Hernia: No hernia is present. There is no hernia in the umbilical area or ventral area.     Comments: Dull to percussion x 4 quads; hypoactive bowel sounds x 4 quads; standing to sitting to supine and reverse quickly on exam table without difficulty  Musculoskeletal:        General: No swelling, tenderness, deformity or signs of injury. Normal range of motion.     Right shoulder: Normal.     Left  shoulder: Normal.     Right elbow: Normal.     Left elbow: Normal.     Right wrist: Normal.     Left wrist: Normal.     Right hand: Normal.     Left hand: Normal.     Cervical back: Normal, normal range of motion and neck supple. No swelling, edema, deformity, erythema, signs of trauma, lacerations, rigidity, spasms, torticollis, tenderness, bony tenderness or crepitus. No pain with movement, spinous process tenderness or muscular tenderness. Normal range of motion.     Thoracic back: Normal.     Lumbar back: Normal.     Right hip: Normal.     Left hip: Normal.     Right knee: Normal.     Left knee: Normal.     Right lower leg: Normal. No swelling or tenderness. No edema.     Left lower leg: Normal. No swelling or tenderness. No edema.     Right ankle: Normal.     Left ankle: Normal.     Right foot: Normal.     Left foot: Normal.  Lymphadenopathy:     Head:     Right side of head: No submental, submandibular, tonsillar, preauricular, posterior auricular or occipital adenopathy.     Left side of head: No submental, submandibular, tonsillar, preauricular, posterior auricular or occipital adenopathy.     Cervical: No cervical adenopathy.     Right cervical: No superficial, deep or posterior cervical adenopathy.    Left cervical: No superficial, deep or posterior cervical adenopathy.  Skin:    General: Skin is warm and dry.     Capillary Refill: Capillary refill takes less than 2 seconds.     Coloration: Skin is not ashen, cyanotic, jaundiced, mottled, pale or sallow.     Findings: No abrasion, abscess, acne, bruising, burn, ecchymosis, erythema, signs of injury, laceration, lesion, petechiae, rash or wound.     Nails: There is no clubbing.  Neurological:     General: No focal deficit present.     Mental Status: She is alert and oriented to person, place, and time. Mental status is at baseline. She is not disoriented.     GCS: GCS eye subscore is 4. GCS verbal subscore is 5. GCS motor  subscore is 6.     Cranial Nerves: Cranial nerves are intact. No cranial nerve deficit, dysarthria or facial asymmetry.     Sensory: Sensation is intact. No sensory deficit.     Motor: Motor function is intact. No weakness, tremor, atrophy, abnormal  muscle tone or seizure activity.     Coordination: Coordination is intact. Coordination normal.     Gait: Gait is intact. Gait normal.     Comments: In/out of chair and on/off exam table without difficulty; gait sure and steady in hallway; bilateral hand grasp equal 5/5 and upper and lower extremity strength equal 5/5  Psychiatric:        Attention and Perception: Attention and perception normal.        Mood and Affect: Mood and affect normal.        Speech: Speech normal.        Behavior: Behavior normal. Behavior is cooperative.        Thought Content: Thought content normal.        Cognition and Memory: Cognition and memory normal.        Judgment: Judgment normal.           Assessment & Plan:  A-unintentional weight loss, Type II diabetes, BMI 28, hyperlipidemia, history of vitamin D deficiency, essential hypertension, nicotine dependence with COPD  P-BMI was 32 now 28.  Patient reports being stressed.  Has not been able to travel to see children/grandchildren during covid 19 pandemic.  Last gyn visit Feb 2020 mammogram still needs to be scheduled patient reminded and RN Rolly Salter can assist her if she needs phone number to schedule appt.  Patient skipping breakfast (her usual) and eating two meals plus snacks per day.  Diabetes annual Hgba1c to be drawn today along with female exec panel, B12 and Vitamin D.  Did not see history of colon cancer screening on file for patient in Epic.  Will encourage her to schedule as overdue.  History of cigarette smoking.  History of anemia requiring blood transfusion.  I recommend covid 19 vaccination when available to her age group currently [place] ages 96 and up and healthcare workers. Exitcare handout preventing  health problems overweight. Patient verbalized understanding information/instructions, agreed with plan of care and had no further questions at this time.  Last PCM visit 12/08/2017.  Patient to schedule optometry exam.  Last [facility] pdrx fill metformin 02/26/2018.  Contacted [facility] pharmacy and patient last fill 2019.  Will need to check with patient at follow up appt if she has changed pharmacies.  Last fill at [facility] Neurontin for back pain Nov 2020.  Patient to follow up in 48 hours for lab results with RN Rolly Salter.  HgbA1c and B12 pending today drawn and sent to [facility].  Discussed avoid dehydration and drink more water as elevated pulse today.  Had caffeine this morning.  Exitcare handout type II diabetes basics, nutrition and foot care.  Patient verbalized understanding information/instructions, agreed with plan of care and had no further questions at this time.  Lowest Vitamin D level 10 and highest 25 per paper chart review in previous 10 years.  Consider supplementation.  Vitamin D level pending/drawn today.  I recommend 15 minutes sunlight in tank top/skirt/shorts when weather warm/sunny.  If supplement recommended to take with fattiest meal of the day.  Exitcare handout vitamin D deficiency.  Patient verbalized understanding information/instructions, agreed with plan of care and had no further questions at this time.  Hyperlipidemia lipitor 20mg  po daily blood drawn today for annual check.  exitcare handout hyperlipidemia.  Patient verbalized understanding information/instructions, agreed with plan of care and had no further questions at this time.  Lisinopril 2.5mg  po daily for hypertension.  Continue medication  Exec panel female fasting today drawn and results pending.  Schedule routine optometry appt.  Avoid weight gain.  Exitcare handout managing hypertension.  Patient verbalized understanding information/instructions, agreed with plan of care and had no further questions at this  time.  Does not want to stop cigarettes at this time.  Exitcare handout on smoking.  Patient verbalized understanding information/instructions and had no further questions at this time.

## 2019-10-01 ENCOUNTER — Telehealth: Payer: Self-pay | Admitting: Registered Nurse

## 2019-10-01 ENCOUNTER — Encounter: Payer: Self-pay | Admitting: Registered Nurse

## 2019-10-01 DIAGNOSIS — E559 Vitamin D deficiency, unspecified: Secondary | ICD-10-CM

## 2019-10-01 LAB — VITAMIN D 25 HYDROXY (VIT D DEFICIENCY, FRACTURES): Vit D, 25-Hydroxy: 4.8 ng/mL — ABNORMAL LOW (ref 30.0–100.0)

## 2019-10-01 LAB — CMP12+LP+TP+TSH+6AC+CBC/D/PLT
ALT: 22 IU/L (ref 0–32)
AST: 22 IU/L (ref 0–40)
Albumin/Globulin Ratio: 2.1 (ref 1.2–2.2)
Albumin: 5 g/dL — ABNORMAL HIGH (ref 3.8–4.9)
Alkaline Phosphatase: 156 IU/L — ABNORMAL HIGH (ref 39–117)
BUN/Creatinine Ratio: 16 (ref 9–23)
BUN: 10 mg/dL (ref 6–24)
Basophils Absolute: 0.1 10*3/uL (ref 0.0–0.2)
Basos: 1 %
Bilirubin Total: 0.5 mg/dL (ref 0.0–1.2)
Calcium: 10.1 mg/dL (ref 8.7–10.2)
Chloride: 98 mmol/L (ref 96–106)
Chol/HDL Ratio: 9.9 ratio — ABNORMAL HIGH (ref 0.0–4.4)
Cholesterol, Total: 316 mg/dL — ABNORMAL HIGH (ref 100–199)
Creatinine, Ser: 0.62 mg/dL (ref 0.57–1.00)
EOS (ABSOLUTE): 0.1 10*3/uL (ref 0.0–0.4)
Eos: 2 %
Estimated CHD Risk: 2.7 times avg. — ABNORMAL HIGH (ref 0.0–1.0)
Free Thyroxine Index: 2.3 (ref 1.2–4.9)
GFR calc Af Amer: 114 mL/min/{1.73_m2} (ref >59)
GFR calc non Af Amer: 99 mL/min/{1.73_m2} (ref >59)
GGT: 35 IU/L (ref 0–60)
Globulin, Total: 2.4 g/dL (ref 1.5–4.5)
Glucose: 372 mg/dL — ABNORMAL HIGH (ref 65–99)
HDL: 32 mg/dL — ABNORMAL LOW (ref >39)
Hematocrit: 48.2 % — ABNORMAL HIGH (ref 34.0–46.6)
Hemoglobin: 16.5 g/dL — ABNORMAL HIGH (ref 11.1–15.9)
Immature Grans (Abs): 0 10*3/uL (ref 0.0–0.1)
Immature Granulocytes: 0 %
Iron: 92 ug/dL (ref 27–159)
LDH: 160 IU/L (ref 119–226)
LDL Chol Calc (NIH): 138 mg/dL — ABNORMAL HIGH (ref 0–99)
Lymphocytes Absolute: 2.5 10*3/uL (ref 0.7–3.1)
Lymphs: 43 %
MCH: 30.7 pg (ref 26.6–33.0)
MCHC: 34.2 g/dL (ref 31.5–35.7)
MCV: 90 fL (ref 79–97)
Monocytes Absolute: 0.4 10*3/uL (ref 0.1–0.9)
Monocytes: 7 %
Neutrophils Absolute: 2.8 10*3/uL (ref 1.4–7.0)
Neutrophils: 47 %
Phosphorus: 4.1 mg/dL (ref 3.0–4.3)
Platelets: 168 10*3/uL (ref 150–450)
Potassium: 4.2 mmol/L (ref 3.5–5.2)
RBC: 5.38 x10E6/uL — ABNORMAL HIGH (ref 3.77–5.28)
RDW: 12.8 % (ref 11.7–15.4)
Sodium: 136 mmol/L (ref 134–144)
T3 Uptake Ratio: 26 % (ref 24–39)
T4, Total: 8.8 ug/dL (ref 4.5–12.0)
TSH: 1.27 u[IU]/mL (ref 0.450–4.500)
Total Protein: 7.4 g/dL (ref 6.0–8.5)
Triglycerides: 754 mg/dL (ref 0–149)
Uric Acid: 2.6 mg/dL — ABNORMAL LOW (ref 3.0–7.2)
VLDL Cholesterol Cal: 146 mg/dL — ABNORMAL HIGH (ref 5–40)
WBC: 6 10*3/uL (ref 3.4–10.8)

## 2019-10-01 LAB — HEMOGLOBIN A1C
Est. average glucose Bld gHb Est-mCnc: 346 mg/dL
Hgb A1c MFr Bld: 13.7 % — ABNORMAL HIGH (ref 4.8–5.6)

## 2019-10-01 LAB — VITAMIN B12: Vitamin B-12: 400 pg/mL (ref 232–1245)

## 2019-10-01 MED ORDER — CHOLECALCIFEROL 1.25 MG (50000 UT) PO TABS: 1.0000 | ORAL_TABLET | ORAL | 0 refills | Status: AC

## 2019-10-01 NOTE — Telephone Encounter (Signed)
Attempted to reach patient with lab results and to ask that she bring her medicine bottles with her to clinic tomorrow for appt review.  Electronic Rx sent to her pharmacy of choice for Vitamin D 50,000 international units po weekly #12 RF0.  Her cell number states that [employer] jack number is not assigned to a customer.  Home number states that number not in service and message left for [place] son for mother to bring her medicine bottles with her tomorrow to clinic appt to review lab results.  I can be reached at [email or web address] if further questions or concerns today clinic closed but RN Rolly Salter and I will be in clinic tomorrow.  Contacted HR Tim and verify patient telephone number on file in Epic matched [place].  Will need to get new telephone number from patient tomorrow at clinic appt.

## 2019-10-01 NOTE — Progress Notes (Signed)
I have called patient and asked her to bring medication bottles with her to appt tomorrow.  Please print results to discuss at appt tomorrow with patient.  Patient reported she had "coffee with sugar/splenda" prior to blood draw.  Avoid dehydration. Drink water to keep urine clear yellow tinged and voiding every 3-4 hours while awake. Avoid added sugar drinks/nonnutritional snacks high in sugar.  I suspect patient diabetes has not been well controlled and will need adjustment of her medications (metformin) and repeat HgbA1c, CMET, CBC plus microalbumin/urinalysis in 3 months.  Patient again restated she had filled her metformin at [facility] [place] location and is taking daily when I saw her at [employer] prior to leaving [facility] yesterday.  Please ask if we can send results electronically to [facility].  Follow up with PCM elevated total cholesterol, triglyceride, LDL, blood sugar, albumin, alkaline phosphatase, RBC, Hgb, Hct and low Vitamin D, HDL, uric acid; I have sent electronic Rx to her pharmacy of choice for Vitamin D 50,000 units by mouth weekly #12 RF0 and repeat Vitamin D level in 3 months.  I also recommend 15 minutes sunlight on skin daily on warm weather/temperature days in tank top/shorts/skirt and will need supplement more than likely after Rx dose po daily.  Take Vitamin D supplement with fattiest meal for best absorption;  and BMI elevated above goal of 25, I recommend 150 minutes per week; dietary fiber 20 grams women per up to date; eat whole grains/fruits/vegetables; keep added sugars to less than 100 calories/ 5 teaspoons for women per American [employer] Association;  electrolytes, iron, kidney function, thyroid, iron and vitamin B12 normal  HgbA1c still pending results.

## 2019-10-02 ENCOUNTER — Encounter: Payer: Self-pay | Admitting: Registered Nurse

## 2019-10-02 ENCOUNTER — Other Ambulatory Visit: Payer: Self-pay

## 2019-10-02 ENCOUNTER — Ambulatory Visit: Payer: Self-pay | Admitting: Registered Nurse

## 2019-10-02 VITALS — BP 113/77 | HR 94 | Temp 98.1°F

## 2019-10-02 DIAGNOSIS — E11649 Type 2 diabetes mellitus with hypoglycemia without coma: Secondary | ICD-10-CM

## 2019-10-02 DIAGNOSIS — E1169 Type 2 diabetes mellitus with other specified complication: Secondary | ICD-10-CM

## 2019-10-02 DIAGNOSIS — Z6828 Body mass index (BMI) 28.0-28.9, adult: Secondary | ICD-10-CM

## 2019-10-02 DIAGNOSIS — E559 Vitamin D deficiency, unspecified: Secondary | ICD-10-CM

## 2019-10-02 DIAGNOSIS — I10 Essential (primary) hypertension: Secondary | ICD-10-CM

## 2019-10-02 MED ORDER — ATORVASTATIN CALCIUM 20 MG PO TABS: 20.0000 mg | ORAL_TABLET | Freq: Every day | ORAL | 0 refills | Status: DC

## 2019-10-02 MED ORDER — LISINOPRIL 5 MG PO TABS: 2.5000 mg | ORAL_TABLET | Freq: Every day | ORAL | 0 refills | Status: DC

## 2019-10-02 MED ORDER — METFORMIN HCL 1000 MG PO TABS: 1000.0000 mg | ORAL_TABLET | Freq: Two times a day (BID) | ORAL | 1 refills | Status: DC

## 2019-10-02 NOTE — Progress Notes (Signed)
Subjective:    Patient ID: Robin Chen, female    DOB: [birth year], 60 y.o.   MRN: [id number]  58y/o Caucasian established female pt presenting for f/u and lab result review. She brings med bottles from home. Bactrim from Feb 2020 that has approx 5 pills remaining. Has not been taking this. Cyclobenzaprine prn for back pain/spasms. One bottle Metformin dated March 2019. Sts she fills up original bottle with refills. Doesn't always take BID. Sts she takes it depending on how she feels. Sometimes just once daily. Now today sts she has been out of Lisinopril for approx one month. Has also been out of Atorvastatin for more than one month but previously reported she was taking these medications every day. Patient reported she picked up vitamin D prescription yesterday from [facility] and started last night.  Listed pcp is no longer at [facility]. Pt is to call office and request appt with new provider. Lab results routed to general [employer] with note of pt calling for new provider assignment.  Patient reported new eyeglasses Oct 2020.  She is aware overdue for colonoscopy spouse just had his this week.  Afraid to get flu shot due to her penicillin allergy has never received before.     Review of Systems  Constitutional: Negative for activity change, appetite change, chills, diaphoresis, fatigue and fever.  HENT: Negative for trouble swallowing and voice change.   Eyes: Negative for photophobia, pain, discharge, redness, itching and visual disturbance.  Respiratory: Negative for cough, chest tightness, shortness of breath, wheezing and stridor.   Cardiovascular: Negative for chest pain and leg swelling.  Gastrointestinal: Negative for abdominal pain, blood in stool, diarrhea, nausea and vomiting.  Endocrine: Negative for cold intolerance and heat intolerance.  Genitourinary: Negative for difficulty urinating.  Musculoskeletal: Negative for back pain, gait problem, joint swelling, myalgias,  neck pain and neck stiffness.  Skin: Negative for rash.  Allergic/Immunologic: Positive for immunocompromised state. Negative for environmental allergies and food allergies.  Neurological: Negative for dizziness, tremors, seizures, syncope, facial asymmetry, speech difficulty, weakness, light-headedness, numbness and headaches.  Hematological: Negative for adenopathy. Does not bruise/bleed easily.  Psychiatric/Behavioral: Negative for agitation, confusion and sleep disturbance.       Objective:   Physical Exam Vitals and nursing note reviewed.  Constitutional:      General: She is awake. She is not in acute distress.    Appearance: Normal appearance. She is well-developed, well-groomed and overweight. She is not ill-appearing, toxic-appearing or diaphoretic.  HENT:     Head: Normocephalic and atraumatic.     Jaw: There is normal jaw occlusion.     Right Ear: Hearing and external ear normal.     Left Ear: Hearing and external ear normal.     Nose: Nose normal.     Mouth/Throat:     Lips: Pink. No lesions.     Mouth: Mucous membranes are moist. No oral lesions or angioedema.  Eyes:     General: Lids are normal. Vision grossly intact. Gaze aligned appropriately. Allergic shiner present. No visual field deficit or scleral icterus.       Right eye: No discharge.        Left eye: No discharge.     Extraocular Movements: Extraocular movements intact.     Conjunctiva/sclera: Conjunctivae normal.     Pupils: Pupils are equal, round, and reactive to light.  Neck:     Thyroid: No thyromegaly.     Trachea: Trachea normal.  Cardiovascular:  Rate and Rhythm: Normal rate and regular rhythm.     Pulses: Normal pulses.          Dorsalis pedis pulses are 2+ on the right side and 2+ on the left side.     Heart sounds: Normal heart sounds.  Pulmonary:     Effort: Pulmonary effort is normal.     Breath sounds: Normal breath sounds and air entry. No stridor, decreased air movement or  transmitted upper airway sounds. No decreased breath sounds, wheezing, rhonchi or rales.     Comments: No cough observed in exam room; wearing cloth mask due to covid pandemic; spoke full sentences without difficulty Abdominal:     General: Abdomen is flat. There is no distension.  Musculoskeletal:        General: No swelling, tenderness, deformity or signs of injury. Normal range of motion.     Right shoulder: Normal.     Left shoulder: Normal.     Right elbow: Normal.     Left elbow: Normal.     Right wrist: Normal.     Left wrist: Normal.     Right hand: Normal.     Left hand: Normal.     Cervical back: Normal, normal range of motion and neck supple. No swelling, edema, deformity, erythema, signs of trauma, lacerations, rigidity, torticollis or crepitus. No pain with movement.     Thoracic back: Normal.     Lumbar back: Normal.     Right hip: Normal.     Left hip: Normal.     Right knee: Normal.     Left knee: Normal.     Right lower leg: No edema.     Left lower leg: No edema.     Right ankle: Normal.     Left ankle: Normal.     Right foot: Normal. Normal range of motion. No deformity, Charcot foot or foot drop.     Left foot: Normal. Normal range of motion. No deformity, Charcot foot or foot drop.  Feet:     Right foot:     Skin integrity: Callus present. No ulcer, blister, skin breakdown, erythema, warmth, dry skin or fissure.     Toenail Condition: Right toenails are normal.     Left foot:     Skin integrity: Callus present. No ulcer, blister, skin breakdown, erythema, warmth, dry skin or fissure.     Toenail Condition: Left toenails are normal.     Comments: Wearing ankle no show cotton socks and slip on sketchers; callouses noted calcaneous and midfoot under MTP joints.  No pain with palpation/erythema/rash/ecchymosis and nails without discoloration/fungal disease. Lymphadenopathy:     Head:     Right side of head: No submental, submandibular, tonsillar or preauricular  adenopathy.     Left side of head: No submental, submandibular, tonsillar or preauricular adenopathy.     Cervical: No cervical adenopathy.     Right cervical: No superficial cervical adenopathy.    Left cervical: No superficial cervical adenopathy.  Skin:    General: Skin is warm and dry.     Capillary Refill: Capillary refill takes less than 2 seconds.     Coloration: Skin is not ashen, cyanotic, jaundiced, mottled, pale or sallow.     Findings: No abrasion, abscess, acne, bruising, burn, ecchymosis, erythema, signs of injury, laceration, lesion, petechiae, rash or wound.     Nails: There is no clubbing.  Neurological:     General: No focal deficit present.     Mental  Status: She is alert and oriented to person, place, and time. Mental status is at baseline.     GCS: GCS eye subscore is 4. GCS verbal subscore is 5. GCS motor subscore is 6.     Cranial Nerves: Cranial nerves are intact. No cranial nerve deficit, dysarthria or facial asymmetry.     Sensory: Sensation is intact. No sensory deficit.     Motor: Motor function is intact. No weakness, tremor, atrophy, abnormal muscle tone or seizure activity.     Coordination: Coordination is intact. Coordination normal.     Gait: Gait is intact. Gait normal.     Comments: Gait sure and steady in hallway; in/out of chair without difficulty; upper and lower extremity strength equal bilaterally 5/5  Psychiatric:        Attention and Perception: Attention and perception normal.        Mood and Affect: Mood and affect normal.        Speech: Speech normal.        Behavior: Behavior normal. Behavior is cooperative.        Thought Content: Thought content normal.        Cognition and Memory: Cognition and memory normal.        Judgment: Judgment normal.      Reviewed 09/30/2019 labs with patient and she was given printed copy by [employer].     Assessment & Plan:  A-uncontrolled type II diabetes mellitus, essential hypertension,  hyperlipidemia, BMI 28, Vitamin D deficiency, breast cancer screening, colon cancer screening, need for flu vaccine  P-Patient given printed copy of lab results and instructions.  Fasting labs for 3 months scheduled with RN Hildred Alamin and patient  Approved lab result sharing with [facility] office.  Increased metformin to 1000mg  po BID patient to take 2 500mg  po BID to finish the pills she has and dispensed metforming 1000mg  po BID #90 RF1 from [facility] to patient today.  Discussed possible side effects of metformin and hypo/hyperglycemia symptoms and printed exitcare handouts and given to patient.  Foot exam without rash or ulcer.  Callouses noted midfoot and calcaneous. Patient reported her home fingerstick machine with adequate supplies to check blood sugar prn and she can also come to clinic for check with RN Hildred Alamin.  Discussed avoid dehydration and keep urine clear yellow tinged not gold/orange or tea colored and voiding every 3-4 hours while awake.  Patient to take her medication every day twice a day.  Patient to schedule colonoscopy once sugar back into a more normal range.  May call and schedule mammogram now.  Avoid weight gain.  Exercise 150 minutes per week/patient very active at work in [place] works full time. Next Hgba1C 29 Dec 2019 with RN Hildred Alamin along with CMET, microalbumin and Vitamin D.  Optometry appt due Oct 2021.  Follow up with PCM to be scheduled.  Discussed covid and flu vaccine.  Flu available in clinic with RN Hildred Alamin patient refused at this time.  Covid vaccine would need to be scheduled in community once patient eligible currently age 45 and up.  Patient verbalized understanding information/instructions, agreed with plan of care and had no further questions at this time.  Patient to restart lisinopril 5mg  take 1/2 po daily #60 RF0 electronic Rx sent to her pharmacy of choice. See RN Hildred Alamin if feeling dizzy, visual changes, headache, shortness of breath or chest pain.  Repeat blood pressure in 3 months  and prn.  Patient verbalized understanding information/instructions, agreed with plan of care and  had no further questions at this time.  Restart atorvastatin 20mg  po daily #90 RF0 dispensed from [facility] to patient today.  Repeat lipids in 3 months fasting with RN scheduled 29 Dec 2019  Patient verbalized understanding information/instructions, agreed with plan of care and had no further questions at this time.  Continue cholecalciferol 50,000 international units po weekly #12 RF0 and next repeat lab 29 Dec 2019 with RN 31 Dec 2019.  Discussed sunlight on skin 15 minutes per day once temperatures improved/spring.  Reiterated currently the medication is once a week with fatty meal.  Patient verbalized understanding information/instructions, agreed with plan of care and had no further questions at this time.

## 2019-10-02 NOTE — Progress Notes (Signed)
Noted, patient agreed to sending copy to [facility] office and was given printed copy by RN Rolly Salter also

## 2019-10-02 NOTE — Patient Instructions (Signed)
Hypoglycemia Hypoglycemia occurs when the level of sugar (glucose) in the blood is too low. Hypoglycemia can happen in people who do or do not have diabetes. It can develop quickly, and it can be a medical emergency. For most people with diabetes, a blood glucose level below 70 mg/dL (3.9 mmol/L) is considered hypoglycemia. Glucose is a type of sugar that provides the body's main source of energy. Certain hormones (insulin and glucagon) control the level of glucose in the blood. Insulin lowers blood glucose, and glucagon raises blood glucose. Hypoglycemia can result from having too much insulin in the bloodstream, or from not eating enough food that contains glucose. You may also have reactive hypoglycemia, which happens within 4 hours after eating a meal. What are the causes? Hypoglycemia occurs most often in people who have diabetes and may be caused by:  Diabetes medicine.  Not eating enough, or not eating often enough.  Increased physical activity.  Drinking alcohol on an empty stomach. If you do not have diabetes, hypoglycemia may be caused by:  A tumor in the pancreas.  Not eating enough, or not eating for long periods at a time (fasting).  A severe infection or illness.  Certain medicines. What increases the risk? Hypoglycemia is more likely to develop in:  People who have diabetes and take medicines to lower blood glucose.  People who abuse alcohol.  People who have a severe illness. What are the signs or symptoms? Mild symptoms Mild hypoglycemia may not cause any symptoms. If you do have symptoms, they may include:  Hunger.  Anxiety.  Sweating and feeling clammy.  Dizziness or feeling light-headed.  Sleepiness.  Nausea.  Increased heart rate.  Headache.  Blurry vision.  Irritability.  Tingling or numbness around the mouth, lips, or tongue.  A change in coordination.  Restless sleep. Moderate symptoms Moderate hypoglycemia can cause:  Mental  confusion and poor judgment.  Behavior changes.  Weakness.  Irregular heartbeat. Severe symptoms Severe hypoglycemia is a medical emergency. It can cause:  Fainting.  Seizures.  Loss of consciousness (coma).  Death. How is this diagnosed? Hypoglycemia is diagnosed with a blood test to measure your blood glucose level. This blood test is done while you are having symptoms. Your health care provider may also do a physical exam and review your medical history. How is this treated? This condition can often be treated by immediately eating or drinking something that contains sugar, such as:  Fruit juice, 4-6 oz (120-150 mL).  Regular soda (not diet soda), 4-6 oz (120-150 mL).  Low-fat milk, 4 oz (120 mL).  Several pieces of hard candy.  Sugar or honey, 1 Tbsp (15 mL). Treating hypoglycemia if you have diabetes If you are alert and able to swallow safely, follow the 15:15 rule:  Take 15 grams of a rapid-acting carbohydrate. Talk with your health care provider about how much you should take.  Rapid-acting options include: ? Glucose pills (take 15 grams). ? 6-8 pieces of hard candy. ? 4-6 oz (120-150 mL) of fruit juice. ? 4-6 oz (120-150 mL) of regular (not diet) soda. ? 1 Tbsp (15 mL) honey or sugar.  Check your blood glucose 15 minutes after you take the carbohydrate.  If the repeat blood glucose level is still at or below 70 mg/dL (3.9 mmol/L), take 15 grams of a carbohydrate again.  If your blood glucose level does not increase above 70 mg/dL (3.9 mmol/L) after 3 tries, seek emergency medical care.  After your blood glucose level returns   to normal, eat a meal or a snack within 1 hour.  Treating severe hypoglycemia Severe hypoglycemia is when your blood glucose level is at or below 54 mg/dL (3 mmol/L). Severe hypoglycemia is a medical emergency. Get medical help right away. If you have severe hypoglycemia and you cannot eat or drink, you may need an injection of  glucagon. A family member or close friend should learn how to check your blood glucose and how to give you a glucagon injection. Ask your health care provider if you need to have an emergency glucagon injection kit available. Severe hypoglycemia may need to be treated in a hospital. The treatment may include getting glucose through an IV. You may also need treatment for the cause of your hypoglycemia. Follow these instructions at home:  General instructions  Take over-the-counter and prescription medicines only as told by your health care provider.  Monitor your blood glucose as told by your health care provider.  Limit alcohol intake to no more than 1 drink a day for nonpregnant women and 2 drinks a day for men. One drink equals 12 oz of beer (355 mL), 5 oz of wine (148 mL), or 1 oz of hard liquor (44 mL).  Keep all follow-up visits as told by your health care provider. This is important. If you have diabetes:  Always have a rapid-acting carbohydrate snack with you to treat low blood glucose.  Follow your diabetes management plan as directed. Make sure you: ? Know the symptoms of hypoglycemia. It is important to treat it right away to prevent it from becoming severe. ? Take your medicines as directed. ? Follow your exercise plan. ? Follow your meal plan. Eat on time, and do not skip meals. ? Check your blood glucose as often as directed. Always check before and after exercise. ? Follow your sick day plan whenever you cannot eat or drink normally. Make this plan in advance with your health care provider.  Share your diabetes management plan with people in your workplace, school, and household.  Check your urine for ketones when you are ill and as told by your health care provider.  Carry a medical alert card or wear medical alert jewelry. Contact a health care provider if:  You have problems keeping your blood glucose in your target range.  You have frequent episodes of  hypoglycemia. Get help right away if:  You continue to have hypoglycemia symptoms after eating or drinking something containing glucose.  Your blood glucose is at or below 54 mg/dL (3 mmol/L).  You have a seizure.  You faint. These symptoms may represent a serious problem that is an emergency. Do not wait to see if the symptoms will go away. Get medical help right away. Call your local emergency services (911 in the [place] Summary  Hypoglycemia occurs when the level of sugar (glucose) in the blood is too low.  Hypoglycemia can happen in people who do or do not have diabetes. It can develop quickly, and it can be a medical emergency.  Make sure you know the symptoms of hypoglycemia and how to treat it.  Always have a rapid-acting carbohydrate snack with you to treat low blood sugar. This information is not intended to replace advice given to you by your health care provider. Make sure you discuss any questions you have with your health care provider. Document Revised: 02/18/2018 Document Reviewed: 10/01/2015 Elsevier Patient Education  [employer]. Hyperglycemia Hyperglycemia occurs when the level of sugar (glucose) in the blood  is too high. Glucose is a type of sugar that provides the body's main source of energy. Certain hormones (insulin and glucagon) control the level of glucose in the blood. Insulin lowers blood glucose, and glucagon increases blood glucose. Hyperglycemia can result from having too little insulin in the bloodstream, or from the body not responding normally to insulin. Hyperglycemia occurs most often in people who have diabetes (diabetes mellitus), but it can happen in people who do not have diabetes. It can develop quickly, and it can be life-threatening if it causes you to become severely dehydrated (diabetic ketoacidosis or hyperglycemic hyperosmolar state). Severe hyperglycemia is a medical emergency. What are the causes? If you have diabetes, hyperglycemia  may be caused by:  Diabetes medicine.  Medicines that increase blood glucose or affect your diabetes control.  Not eating enough, or not eating often enough.  Changes in physical activity level.  Being sick or having an infection. If you have prediabetes or undiagnosed diabetes:  Hyperglycemia may be caused by those conditions. If you do not have diabetes, hyperglycemia may be caused by:  Certain medicines, including steroid medicines, beta-blockers, epinephrine, and thiazide diuretics.  Stress.  Serious illness.  Surgery.  Diseases of the pancreas.  Infection. What increases the risk? Hyperglycemia is more likely to develop in people who have risk factors for diabetes, such as:  Having a family member with diabetes.  Having a gene for type 1 diabetes that is passed from parent to child (inherited).  Living in an area with cold weather conditions.  Exposure to certain viruses.  Certain conditions in which the body's disease-fighting (immune) system attacks itself (autoimmune disorders).  Being overweight or obese.  Having an inactive (sedentary) lifestyle.  Having been diagnosed with insulin resistance.  Having a history of prediabetes, gestational diabetes, or polycystic ovarian syndrome (PCOS).  Being of American-Indian, African-American, Hispanic/Latino, or Asian/Pacific Islander descent. What are the signs or symptoms? Hyperglycemia may not cause any symptoms. If you do have symptoms, they may include early warning signs, such as:  Increased thirst.  Hunger.  Feeling very tired.  Needing to urinate more often than usual.  Blurry vision. Other symptoms may develop if hyperglycemia gets worse, such as:  Dry mouth.  Loss of appetite.  Fruity-smelling breath.  Weakness.  Unexpected or rapid weight gain or weight loss.  Tingling or numbness in the hands or feet.  Headache.  Skin that does not quickly return to normal after being lightly  pinched and released (poor skin turgor).  Abdominal pain.  Cuts or bruises that are slow to heal. How is this diagnosed? Hyperglycemia is diagnosed with a blood test to measure your blood glucose level. This blood test is usually done while you are having symptoms. Your health care provider may also do a physical exam and review your medical history. You may have more tests to determine the cause of your hyperglycemia, such as:  A fasting blood glucose (FBG) test. You will not be allowed to eat (you will fast) for at least 8 hours before a blood sample is taken.  An A1c (hemoglobin A1c) blood test. This provides information about blood glucose control over the previous 2-3 months.  An oral glucose tolerance test (OGTT). This measures your blood glucose at two times: ? After fasting. This is your baseline blood glucose level. ? Two hours after drinking a beverage that contains glucose. How is this treated? Treatment depends on the cause of your hyperglycemia. Treatment may include:  Taking medicine to regulate  your blood glucose levels. If you take insulin or other diabetes medicines, your medicine or dosage may be adjusted.  Lifestyle changes, such as exercising more, eating healthier foods, or losing weight.  Treating an illness or infection, if this caused your hyperglycemia.  Checking your blood glucose more often.  Stopping or reducing steroid medicines, if these caused your hyperglycemia. If your hyperglycemia becomes severe and it results in hyperglycemic hyperosmolar state, you must be hospitalized and given IV fluids. Follow these instructions at home:  General instructions  Take over-the-counter and prescription medicines only as told by your health care provider.  Do not use any products that contain nicotine or tobacco, such as cigarettes and e-cigarettes. If you need help quitting, ask your health care provider.  Limit alcohol intake to no more than 1 drink per day  for nonpregnant women and 2 drinks per day for men. One drink equals 12 oz of beer, 5 oz of wine, or 1 oz of hard liquor.  Learn to manage stress. If you need help with this, ask your health care provider.  Keep all follow-up visits as told by your health care provider. This is important. Eating and drinking   Maintain a healthy weight.  Exercise regularly, as directed by your health care provider.  Stay hydrated, especially when you exercise, get sick, or spend time in hot temperatures.  Eat healthy foods, such as: ? Lean proteins. ? Complex carbohydrates. ? Fresh fruits and vegetables. ? Low-fat dairy products. ? Healthy fats.  Drink enough fluid to keep your urine clear or pale yellow. If you have diabetes:  Make sure you know the symptoms of hyperglycemia.  Follow your diabetes management plan, as told by your health care provider. Make sure you: ? Take your insulin and medicines as directed. ? Follow your exercise plan. ? Follow your meal plan. Eat on time, and do not skip meals. ? Check your blood glucose as often as directed. Make sure to check your blood glucose before and after exercise. If you exercise longer or in a different way than usual, check your blood glucose more often. ? Follow your sick day plan whenever you cannot eat or drink normally. Make this plan in advance with your health care provider.  Share your diabetes management plan with people in your workplace, school, and household.  Check your urine for ketones when you are ill and as told by your health care provider.  Carry a medical alert card or wear medical alert jewelry. Contact a health care provider if:  Your blood glucose is at or above 240 mg/dL (13.3 mmol/L) for 2 days in a row.  You have problems keeping your blood glucose in your target range.  You have frequent episodes of hyperglycemia. Get help right away if:  You have difficulty breathing.  You have a change in how you think,  feel, or act (mental status).  You have nausea or vomiting that does not go away. These symptoms may represent a serious problem that is an emergency. Do not wait to see if the symptoms will go away. Get medical help right away. Call your local emergency services (911 in the [place] Do not drive yourself to the hospital. Summary  Hyperglycemia occurs when the level of sugar (glucose) in the blood is too high.  Hyperglycemia is diagnosed with a blood test to measure your blood glucose level. This blood test is usually done while you are having symptoms. Your health care provider may also do a physical  exam and review your medical history.  If you have diabetes, follow your diabetes management plan as told by your health care provider.  Contact your health care provider if you have problems keeping your blood glucose in your target range. This information is not intended to replace advice given to you by your health care provider. Make sure you discuss any questions you have with your health care provider. Document Revised: 05/15/2016 Document Reviewed: 05/15/2016 Elsevier Patient Education  [employer].

## 2019-10-06 NOTE — Telephone Encounter (Signed)
noted 

## 2019-10-06 NOTE — Telephone Encounter (Signed)
Updated mobile phone number received from pt via email and updated in [employer].

## 2020-01-05 ENCOUNTER — Telehealth: Payer: Self-pay | Admitting: *Deleted

## 2020-01-05 DIAGNOSIS — E785 Hyperlipidemia, unspecified: Secondary | ICD-10-CM

## 2020-01-05 DIAGNOSIS — E1169 Type 2 diabetes mellitus with other specified complication: Secondary | ICD-10-CM

## 2020-01-05 DIAGNOSIS — E119 Type 2 diabetes mellitus without complications: Secondary | ICD-10-CM

## 2020-01-05 DIAGNOSIS — I10 Essential (primary) hypertension: Secondary | ICD-10-CM

## 2020-01-05 MED ORDER — LISINOPRIL 5 MG PO TABS: 2.5000 mg | ORAL_TABLET | Freq: Every day | ORAL | 0 refills | Status: DC

## 2020-01-05 NOTE — Telephone Encounter (Signed)
Noted agreed with plan of care 

## 2020-01-05 NOTE — Telephone Encounter (Signed)
Refill request from pharmacy. Pt was supposed to scheduled primary care appt after bridge refill in January. Verbal order received from NP Inetta Fermo, okay to bridge again, but remind pt to schedule and check a BP. Overhead paged pt and sent email requesting BP check today and instructing her to call pcp office for new appt.

## 2020-01-06 ENCOUNTER — Ambulatory Visit: Payer: Self-pay | Admitting: *Deleted

## 2020-01-06 ENCOUNTER — Other Ambulatory Visit: Payer: Self-pay

## 2020-01-06 VITALS — BP 133/91 | HR 97

## 2020-01-06 DIAGNOSIS — I10 Essential (primary) hypertension: Secondary | ICD-10-CM | POA: Insufficient documentation

## 2020-01-06 MED ORDER — METFORMIN HCL 1000 MG PO TABS: 1000.0000 mg | ORAL_TABLET | Freq: Two times a day (BID) | ORAL | 0 refills | Status: DC

## 2020-01-06 MED ORDER — ATORVASTATIN CALCIUM 20 MG PO TABS: 20.0000 mg | ORAL_TABLET | Freq: Every day | ORAL | 0 refills | Status: DC

## 2020-01-06 NOTE — Telephone Encounter (Signed)
Dispensed from [facility] to patient metformin 1000mg  po BID #180 RF0 and atorvastatin 20mg  po daily #90 RF0.  Patient to schedule follow up with PCM.  Electronic Rx to pharmacy of choice lisinopril 5mg  take .05 po daily #60 RF0  Labs completed 09/30/2019 needs repeat lipids, Hgba1c, microalbumin and vitamin D.  Patient still needs BP check also.

## 2020-01-06 NOTE — Telephone Encounter (Signed)
noted 

## 2020-01-06 NOTE — Telephone Encounter (Signed)
Pt scheduled to return to clinic this afternoon at 3:15 when she reports on 2nd shift. Will attempt to schedule labs with her at that time. She is also supposed to be scheduling pcp appt.

## 2020-01-06 NOTE — Progress Notes (Signed)
Pt in for BP check. Meds refilled through [facility] on-site and pt overdue for labs, annual exam with pcp. She has scheduled CPE with Janne Lab, NP at [facility] for 01/22/20 at 10:10. Fasting labs scheduled here in clinic ahead of this on 01/13/20 at 1500 as pt works 2nd shift.

## 2020-01-14 NOTE — Telephone Encounter (Signed)
Please follow up with patient as still needs labs and PCM appt

## 2020-01-15 NOTE — Telephone Encounter (Signed)
Pt has pcp appt scheduled for 01/22/20. Will email pt to schedule fasting labs for 01/20/20 at start of her 2nd shift workday. Pt came by clinic for f/u earlier in week but clinic had closed early that day.

## 2020-01-15 NOTE — Telephone Encounter (Signed)
Noted PCM appt scheduled and labs to be drawn prior to [facility] appt this month.

## 2020-01-20 ENCOUNTER — Other Ambulatory Visit: Payer: Self-pay

## 2020-01-20 ENCOUNTER — Ambulatory Visit: Payer: Self-pay | Admitting: *Deleted

## 2020-01-20 DIAGNOSIS — E785 Hyperlipidemia, unspecified: Secondary | ICD-10-CM

## 2020-01-20 DIAGNOSIS — I10 Essential (primary) hypertension: Secondary | ICD-10-CM

## 2020-01-20 DIAGNOSIS — E1169 Type 2 diabetes mellitus with other specified complication: Secondary | ICD-10-CM

## 2020-01-20 DIAGNOSIS — E559 Vitamin D deficiency, unspecified: Secondary | ICD-10-CM

## 2020-01-20 DIAGNOSIS — E119 Type 2 diabetes mellitus without complications: Secondary | ICD-10-CM

## 2020-01-20 NOTE — Progress Notes (Signed)
Fasting labs in advance of upcoming annual exam 5/13.

## 2020-01-21 LAB — CMP12+LP+TP+TSH+6AC+CBC/D/PLT
ALT: 22 IU/L (ref 0–32)
AST: 27 IU/L (ref 0–40)
Albumin/Globulin Ratio: 2.2 (ref 1.2–2.2)
Albumin: 5 g/dL — ABNORMAL HIGH (ref 3.8–4.9)
Alkaline Phosphatase: 100 IU/L (ref 39–117)
BUN/Creatinine Ratio: 27 — ABNORMAL HIGH (ref 9–23)
BUN: 18 mg/dL (ref 6–24)
Basophils Absolute: 0.1 10*3/uL (ref 0.0–0.2)
Basos: 1 %
Bilirubin Total: 0.5 mg/dL (ref 0.0–1.2)
Calcium: 10.4 mg/dL — ABNORMAL HIGH (ref 8.7–10.2)
Chloride: 101 mmol/L (ref 96–106)
Chol/HDL Ratio: 2.7 ratio (ref 0.0–4.4)
Cholesterol, Total: 181 mg/dL (ref 100–199)
Creatinine, Ser: 0.67 mg/dL (ref 0.57–1.00)
EOS (ABSOLUTE): 0.2 10*3/uL (ref 0.0–0.4)
Eos: 3 %
Estimated CHD Risk: <0.5 times avg. (ref 0.0–1.0)
Free Thyroxine Index: 2 (ref 1.2–4.9)
GFR calc Af Amer: 111 mL/min/{1.73_m2} (ref >59)
GFR calc non Af Amer: 97 mL/min/{1.73_m2} (ref >59)
GGT: 19 IU/L (ref 0–60)
Globulin, Total: 2.3 g/dL (ref 1.5–4.5)
Glucose: 100 mg/dL — ABNORMAL HIGH (ref 65–99)
HDL: 66 mg/dL (ref >39)
Hematocrit: 44 % (ref 34.0–46.6)
Hemoglobin: 14.9 g/dL (ref 11.1–15.9)
Immature Grans (Abs): 0 10*3/uL (ref 0.0–0.1)
Immature Granulocytes: 0 %
Iron: 100 ug/dL (ref 27–159)
LDH: 184 IU/L (ref 119–226)
LDL Chol Calc (NIH): 92 mg/dL (ref 0–99)
Lymphocytes Absolute: 3.6 10*3/uL — ABNORMAL HIGH (ref 0.7–3.1)
Lymphs: 44 %
MCH: 30.8 pg (ref 26.6–33.0)
MCHC: 33.9 g/dL (ref 31.5–35.7)
MCV: 91 fL (ref 79–97)
Monocytes Absolute: 0.6 10*3/uL (ref 0.1–0.9)
Monocytes: 7 %
Neutrophils Absolute: 3.6 10*3/uL (ref 1.4–7.0)
Neutrophils: 45 %
Phosphorus: 4.2 mg/dL (ref 3.0–4.3)
Platelets: 195 10*3/uL (ref 150–450)
Potassium: 4.4 mmol/L (ref 3.5–5.2)
RBC: 4.84 x10E6/uL (ref 3.77–5.28)
RDW: 12.7 % (ref 11.7–15.4)
Sodium: 141 mmol/L (ref 134–144)
T3 Uptake Ratio: 23 % — ABNORMAL LOW (ref 24–39)
T4, Total: 8.9 ug/dL (ref 4.5–12.0)
TSH: 1.71 u[IU]/mL (ref 0.450–4.500)
Total Protein: 7.3 g/dL (ref 6.0–8.5)
Triglycerides: 133 mg/dL (ref 0–149)
Uric Acid: 4 mg/dL (ref 3.0–7.2)
VLDL Cholesterol Cal: 23 mg/dL (ref 5–40)
WBC: 8.1 10*3/uL (ref 3.4–10.8)

## 2020-01-21 LAB — VITAMIN D 25 HYDROXY (VIT D DEFICIENCY, FRACTURES): Vit D, 25-Hydroxy: 37.2 ng/mL (ref 30.0–100.0)

## 2020-01-21 LAB — HGB A1C W/O EAG: Hgb A1c MFr Bld: 7 % — ABNORMAL HIGH (ref 4.8–5.6)

## 2020-01-22 ENCOUNTER — Encounter: Payer: PRIVATE HEALTH INSURANCE | Admitting: Adult Health Nurse Practitioner

## 2020-01-23 NOTE — Progress Notes (Signed)
Contacted pt and scheduled her for urine sample for microalbumin/creatinine for Tuesday 5/15 at 1500.

## 2020-01-27 ENCOUNTER — Ambulatory Visit: Payer: Self-pay | Admitting: *Deleted

## 2020-01-27 DIAGNOSIS — E119 Type 2 diabetes mellitus without complications: Secondary | ICD-10-CM

## 2020-01-28 LAB — MICROALBUMIN / CREATININE URINE RATIO
Creatinine, Urine: 23.5 mg/dL
Microalb/Creat Ratio: <13 mg/g creat (ref 0–29)
Microalbumin, Urine: <3 ug/mL

## 2020-01-29 NOTE — Progress Notes (Signed)
Notified pt by email of normal result. Pt 's CPE moved to 02/04/20. Results not forwarded to pcp as on [facility] system and will be reviewing other labs previously routed during pt's appt as well.

## 2020-01-29 NOTE — Progress Notes (Signed)
noted 

## 2020-01-30 NOTE — Telephone Encounter (Signed)
Per epic appt now 02/04/2020 with PCM

## 2020-02-04 ENCOUNTER — Encounter: Payer: Self-pay | Admitting: Gastroenterology

## 2020-02-04 ENCOUNTER — Ambulatory Visit: Payer: PRIVATE HEALTH INSURANCE | Admitting: Registered Nurse

## 2020-02-04 ENCOUNTER — Encounter: Payer: Self-pay | Admitting: Registered Nurse

## 2020-02-04 ENCOUNTER — Other Ambulatory Visit: Payer: Self-pay

## 2020-02-04 ENCOUNTER — Other Ambulatory Visit: Payer: Self-pay | Admitting: Registered Nurse

## 2020-02-04 VITALS — BP 157/87 | HR 97 | Temp 98.3°F | Ht 64.0 in | Wt 175.2 lb

## 2020-02-04 DIAGNOSIS — E119 Type 2 diabetes mellitus without complications: Secondary | ICD-10-CM | POA: Diagnosis not present

## 2020-02-04 DIAGNOSIS — E1159 Type 2 diabetes mellitus with other circulatory complications: Secondary | ICD-10-CM

## 2020-02-04 DIAGNOSIS — Z1211 Encounter for screening for malignant neoplasm of colon: Secondary | ICD-10-CM

## 2020-02-04 DIAGNOSIS — E785 Hyperlipidemia, unspecified: Secondary | ICD-10-CM

## 2020-02-04 DIAGNOSIS — E1169 Type 2 diabetes mellitus with other specified complication: Secondary | ICD-10-CM

## 2020-02-04 DIAGNOSIS — I10 Essential (primary) hypertension: Secondary | ICD-10-CM

## 2020-02-04 MED ORDER — METFORMIN HCL 1000 MG PO TABS: 1000.0000 mg | ORAL_TABLET | Freq: Two times a day (BID) | ORAL | 1 refills | Status: DC

## 2020-02-04 MED ORDER — ATORVASTATIN CALCIUM 20 MG PO TABS: 20.0000 mg | ORAL_TABLET | Freq: Every day | ORAL | 1 refills | Status: DC

## 2020-02-04 MED ORDER — LISINOPRIL 5 MG PO TABS: 5.0000 mg | ORAL_TABLET | Freq: Every day | ORAL | 1 refills | Status: DC

## 2020-02-04 NOTE — Patient Instructions (Signed)
° ° ° °  If you have lab work done today you will be contacted with your lab results within the next 2 weeks.  If you have not heard from us then please contact us. The fastest way to get your results is to register for My Chart. ° ° °IF you received an x-ray today, you will receive an invoice from [facility]. Please contact  Radiology at [phone] with questions or concerns regarding your invoice.  ° °IF you received labwork today, you will receive an invoice from [facility]. Please contact [facility] at [phone] with questions or concerns regarding your invoice.  ° °Our billing staff will not be able to assist you with questions regarding bills from these companies. ° °You will be contacted with the lab results as soon as they are available. The fastest way to get your results is to activate your My Chart account. Instructions are located on the last page of this paperwork. If you have not heard from us regarding the results in 2 weeks, please contact this office. °  ° ° ° °

## 2020-02-04 NOTE — Progress Notes (Signed)
Established Patient Office Visit  Subjective:  Patient ID: Robin Chen, female    DOB: 1960/10/10  Age: 59 y.o. MRN: [id number]  CC:  Chief Complaint  Patient presents with  . New Patient (Initial Visit)    Pt is here to get ubder a provider's care to help manage her chronic conditions    HPI Robin Chen presents for visit to establish care.  Last seen in 2019 by [place].  Needs to establish care for management of t2dm, hld, and htn. In January, was noted to have A1c of 13.2 - restarted metformin 1000mg  PO bid and tight diet control and was 7.0 as of 01/20/20. Pt feeling much better. Denies symptoms of t2dm, htn, or hld. Due for ophthalmology exam and foot exam  Taking metformin 1000mg  PO bid, atorvastatin 20mg  PO qd, and lisinopril 2.5mg  PO qd. Tolerating all well  BP mildly elevated on arrival today.   Due for colonoscopy, will send referral.   Past Medical History:  Diagnosis Date  . Anemia   . Blood transfusion    2003  . Chronic kidney disease    Rt UPJ stone  . COPD (chronic obstructive pulmonary disease) ([place])   . Diabetes mellitus without complication ([place])   . Headache(784.0)   . Hypertension     Past Surgical History:  Procedure Laterality Date  . CYSTOSCOPY W/ URETERAL STENT PLACEMENT    . DILATION AND CURETTAGE OF UTERUS     2003    Family History  Problem Relation Age of Onset  . Cancer Mother     Social History   Socioeconomic History  . Marital status: Married    Spouse name: Miralem  . Number of children: 3  . Years of education: Not on file  . Highest education level: Not on file  Occupational History  . Not on file  Tobacco Use  . Smoking status: Current Every Day Smoker    Packs/day: 1.00    Years: 30.00    Pack years: 30.00    Types: Cigarettes  . Smokeless tobacco: Never Used  Substance and Sexual Activity  . Alcohol use: No  . Drug use: No  . Sexual activity: Yes  Other Topics Concern  . Not on file  Social  History Narrative  . Not on file   Social Determinants of Health   Financial Resource Strain:   . Difficulty of Paying Living Expenses:   Food Insecurity:   . Worried About Charity fundraiser in the Last Year:   . Arboriculturist in the Last Year:   Transportation Needs:   . Film/video editor (Medical):   Marland Kitchen Lack of Transportation (Non-Medical):   Physical Activity:   . Days of Exercise per Week:   . Minutes of Exercise per Session:   Stress:   . Feeling of Stress :   Social Connections:   . Frequency of Communication with Friends and Family:   . Frequency of Social Gatherings with Friends and Family:   . Attends Religious Services:   . Active Member of Clubs or Organizations:   . Attends Archivist Meetings:   Marland Kitchen Marital Status:   Intimate Partner Violence:   . Fear of Current or Ex-Partner:   . Emotionally Abused:   Marland Kitchen Physically Abused:   . Sexually Abused:     Outpatient Medications Prior to Visit  Medication Sig Dispense Refill  . albuterol (VENTOLIN HFA) 108 (90 Base) MCG/ACT inhaler     .  lisinopril (ZESTRIL) 5 MG tablet Take 0.5 tablets (2.5 mg total) by mouth daily. 60 tablet 0  . metFORMIN (GLUCOPHAGE) 1000 MG tablet Take 1 tablet (1,000 mg total) by mouth 2 (two) times daily with a meal. 180 tablet 0  . atorvastatin (LIPITOR) 20 MG tablet Take 1 tablet (20 mg total) by mouth daily. (Patient not taking: Reported on 02/04/2020) 90 tablet 0  . cyclobenzaprine (FLEXERIL) 10 MG tablet Take 1 tablet (10 mg total) by mouth 3 (three) times daily as needed for muscle spasms. (Patient not taking: Reported on 02/04/2020) 30 tablet 0  . ibuprofen (ADVIL) 800 MG tablet Take 1 tablet (800 mg total) by mouth every 8 (eight) hours as needed for mild pain or moderate pain. (Patient not taking: Reported on 02/04/2020) 30 tablet 0  . Spacer/Aero-Holding Chambers (EQ SPACE CHAMBER ANTI-STATIC) DEVI      No facility-administered medications prior to visit.    Allergies    Allergen Reactions  . Penicillins Rash and Other (See Comments)    Has patient had a PCN reaction causing immediate rash, facial/tongue/throat swelling, SOB or lightheadedness with hypotension: No Has patient had a PCN reaction causing severe rash involving mucus membranes or skin necrosis: No Has patient had a PCN reaction that required hospitalization No Has patient had a PCN reaction occurring within the last 10 years: No If all of the above answers are "NO", then may proceed with Cephalosporin use.  . Ciprofloxacin Rash    ROS Review of Systems  Constitutional: Negative.   HENT: Negative.   Eyes: Negative.   Respiratory: Negative.   Cardiovascular: Negative.   Gastrointestinal: Negative.   Endocrine: Negative.   Genitourinary: Negative.   Musculoskeletal: Negative.   Skin: Negative.   Allergic/Immunologic: Negative.   Neurological: Negative.   Hematological: Negative.   Psychiatric/Behavioral: Negative.   All other systems reviewed and are negative.     Objective:    Physical Exam  Constitutional: She is oriented to person, place, and time. She appears well-developed. No distress.  Cardiovascular: Normal rate, regular rhythm and normal heart sounds. Exam reveals no gallop and no friction rub.  No murmur heard. Pulmonary/Chest: Effort normal and breath sounds normal. No respiratory distress. She has no wheezes. She has no rales. She exhibits no tenderness.  Neurological: She is alert and oriented to person, place, and time.  Skin: Skin is warm and dry. No rash noted. She is not diaphoretic. No erythema. No pallor.  Psychiatric: She has a normal mood and affect. Her behavior is normal. Judgment and thought content normal.  Nursing note and vitals reviewed.   BP (!) 157/87 (BP Location: Left Arm, Patient Position: Sitting, Cuff Size: Normal)   Pulse 97   Temp 98.3 F (36.8 C) (Temporal)   Ht 5\' 4"  (1.626 m)   Wt 175 lb 3.2 oz (79.5 kg)   SpO2 98%   BMI 30.07 kg/m   Wt Readings from Last 3 Encounters:  02/04/20 175 lb 3.2 oz (79.5 kg)  09/30/19 167 lb (75.8 kg)  11/05/18 198 lb 9.6 oz (90.1 kg)     Health Maintenance Due  Topic Date Due  . COVID-19 Vaccine (1) Never done  . COLONOSCOPY  Never done  . OPHTHALMOLOGY EXAM  04/11/2018    There are no preventive care reminders to display for this patient.  Lab Results  Component Value Date   TSH 1.710 01/20/2020   Lab Results  Component Value Date   WBC 8.1 01/20/2020   HGB 14.9 01/20/2020  HCT 44.0 01/20/2020   MCV 91 01/20/2020   PLT 195 01/20/2020   Lab Results  Component Value Date   NA 141 01/20/2020   K 4.4 01/20/2020   CO2 20 12/08/2017   GLUCOSE 100 (H) 01/20/2020   BUN 18 01/20/2020   CREATININE 0.67 01/20/2020   BILITOT 0.5 01/20/2020   ALKPHOS 100 01/20/2020   AST 27 01/20/2020   ALT 22 01/20/2020   PROT 7.3 01/20/2020   ALBUMIN 5.0 (H) 01/20/2020   CALCIUM 10.4 (H) 01/20/2020   ANIONGAP 9 08/10/2017   Lab Results  Component Value Date   CHOL 181 01/20/2020   Lab Results  Component Value Date   HDL 66 01/20/2020   Lab Results  Component Value Date   LDLCALC 92 01/20/2020   Lab Results  Component Value Date   TRIG 133 01/20/2020   Lab Results  Component Value Date   CHOLHDL 2.7 01/20/2020   Lab Results  Component Value Date   HGBA1C 7.0 (H) 01/20/2020      Assessment & Plan:   Problem List Items Addressed This Visit      Endocrine   Type 2 diabetes mellitus without complication, without long-term current use of insulin (HCC)   Relevant Orders   Ambulatory referral to Ophthalmology   HM Diabetes Foot Exam (Completed)    Other Visit Diagnoses    Screening for colon cancer    -  Primary   Relevant Orders   Ambulatory referral to Gastroenterology      No orders of the defined types were placed in this encounter.   Follow-up: No follow-ups on file.   PLAN  Refer to ophthalmology and GI  Increase lisinopril to 5mg  PO  qd  Otherwise continue regimen as prescribed, tight diet control, and regular exercise.   Return in 6 mo  Patient encouraged to call clinic with any questions, comments, or concerns.   , NP

## 2020-02-05 NOTE — Telephone Encounter (Signed)
Reviewed PCM office note lisinopril increased to 5mg  po daily please print Rx for [facility] chart and next fill from [facility] formulary.

## 2020-02-10 NOTE — Telephone Encounter (Signed)
noted 

## 2020-02-10 NOTE — Telephone Encounter (Signed)
Spoke with Robin Chen and her son (at her request due to slight language barrier) over the phone on 02/06/20. Robin Chen has approx one month remaining of current full 5mg  tablets (that she was taking 1/2 tab daily of). [facility]placements dispensary only carries 10mg  tablets that she will need to split in half as she has been doing with her 2.5mg  dosing.  Robin Chen to notify RN once she is down to 1 week remaining of current supply to inform RN if she is going to fill through dispensary with her other 2 meds, or if she will continue obtaining through external pharmacy.

## 2020-02-27 ENCOUNTER — Ambulatory Visit (AMBULATORY_SURGERY_CENTER): Payer: Self-pay | Admitting: *Deleted

## 2020-02-27 ENCOUNTER — Other Ambulatory Visit: Payer: Self-pay

## 2020-02-27 VITALS — Ht 64.0 in | Wt 177.0 lb

## 2020-02-27 DIAGNOSIS — Z1211 Encounter for screening for malignant neoplasm of colon: Secondary | ICD-10-CM

## 2020-02-27 NOTE — Progress Notes (Signed)
Patient and daughter is here in-person for PV. Daughter is helping to translate for the pt. Patient denies any allergies to eggs or soy. Patient denies any problems with anesthesia/sedation. Patient denies any oxygen use at home. Patient denies taking any diet/weight loss medications or blood thinners. Patient is not being treated for MRSA or C-diff. Patient is aware of our care-partner policy and Covid-19 safety protocol. EMMI education assisgned to the patient for the procedure, this was explained and instructions given to patient.   Pt completed covid vaccines x2 on 12/29/19 per pt.

## 2020-03-04 NOTE — Telephone Encounter (Signed)
Please follow up with patient re lisinopril Rx

## 2020-03-16 ENCOUNTER — Other Ambulatory Visit: Payer: Self-pay

## 2020-03-16 ENCOUNTER — Ambulatory Visit (AMBULATORY_SURGERY_CENTER): Payer: No Typology Code available for payment source | Admitting: Gastroenterology

## 2020-03-16 ENCOUNTER — Encounter: Payer: Self-pay | Admitting: Gastroenterology

## 2020-03-16 VITALS — BP 115/80 | HR 95 | Temp 98.2°F | Resp 13 | Ht 64.8 in | Wt 177.0 lb

## 2020-03-16 DIAGNOSIS — Z1211 Encounter for screening for malignant neoplasm of colon: Secondary | ICD-10-CM

## 2020-03-16 MED ORDER — SODIUM CHLORIDE 0.9 % IV SOLN: 500.0000 mL | Freq: Once | INTRAVENOUS | Status: AC

## 2020-03-16 NOTE — Op Note (Signed)
[facility] Patient Name: Robin Chen Procedure Date: 03/16/2020 10:33 AM MRN: [id number] Endoscopist: Sherilyn Cooter L. Myrtie Neither , MD Age: 59 Referring MD:  Date of Birth: 05-12-1961 Gender: Female Account #: [id number] Procedure:                Colonoscopy Indications:              Screening for colorectal malignant neoplasm, This                            is the patient's first colonoscopy Medicines:                Monitored Anesthesia Care Procedure:                Pre-Anesthesia Assessment:                           - Prior to the procedure, a History and Physical                            was performed, and patient medications and                            allergies were reviewed. The patient's tolerance of                            previous anesthesia was also reviewed. The risks                            and benefits of the procedure and the sedation                            options and risks were discussed with the patient.                            All questions were answered, and informed consent                            was obtained. Prior Anticoagulants: The patient has                            taken no previous anticoagulant or antiplatelet                            agents. ASA Grade Assessment: III - A patient with                            severe systemic disease. After reviewing the risks                            and benefits, the patient was deemed in                            satisfactory condition to undergo the procedure.  After obtaining informed consent, the colonoscope                            was passed under direct vision. Throughout the                            procedure, the patient's blood pressure, pulse, and                            oxygen saturations were monitored continuously. The                            Colonoscope was introduced through the anus and                            advanced to the the  terminal ileum, with                            identification of the appendiceal orifice and IC                            valve. The colonoscopy was somewhat difficult due                            to a redundant colon. Successful completion of the                            procedure was aided by changing the patient to a                            supine position and using manual pressure. The                            patient tolerated the procedure well. The quality                            of the bowel preparation was good after lavage. The                            ileocecal valve, appendiceal orifice, and rectum                            were photographed. The bowel preparation used was                            Miralax. Scope In: 10:40:47 AM Scope Out: 11:06:57 AM Scope Withdrawal Time: 0 hours 17 minutes 27 seconds  Total Procedure Duration: 0 hours 26 minutes 10 seconds  Findings:                 The perianal and digital rectal examinations were                            normal.  The terminal ileum appeared normal.                           A few diverticula were found in the left colon.                           There is no endoscopic evidence of polyps in the                            entire colon.                           The exam was otherwise without abnormality on                            direct and retroflexion views. Complications:            No immediate complications. Estimated Blood Loss:     Estimated blood loss: none. Impression:               - The examined portion of the ileum was normal.                           - Diverticulosis in the left colon.                           - The examination was otherwise normal on direct                            and retroflexion views.                           - No specimens collected. Recommendation:           - Patient has a contact number available for                             emergencies. The signs and symptoms of potential                            delayed complications were discussed with the                            patient. Return to normal activities tomorrow.                            Written discharge instructions were provided to the                            patient.                           - Resume previous diet.                           - Continue present medications.                           -  Repeat colonoscopy in 10 years for screening                            purposes. (Suprep or Plenvu for next exam) Lynnette Pote L. Myrtie Neither, MD 03/16/2020 11:11:09 AM This report has been signed electronically.

## 2020-03-16 NOTE — Progress Notes (Signed)
Pt's states no medical or surgical changes since previsit or office visit.   V/s-cw  Check-in-jb 

## 2020-03-16 NOTE — Patient Instructions (Signed)
YOU HAD AN ENDOSCOPIC PROCEDURE TODAY AT THE [facility]:   Refer to the procedure report that was given to you for any specific questions about what was found during the examination.  If the procedure report does not answer your questions, please call your gastroenterologist to clarify.  If you requested that your care partner not be given the details of your procedure findings, then the procedure report has been included in a sealed envelope for you to review at your convenience later.  YOU SHOULD EXPECT: Some feelings of bloating in the abdomen. Passage of more Lucien than usual.  Walking can help get rid of the air that was put into your GI tract during the procedure and reduce the bloating. If you had a lower endoscopy (such as a colonoscopy or flexible sigmoidoscopy) you may notice spotting of blood in your stool or on the toilet paper. If you underwent a bowel prep for your procedure, you may not have a normal bowel movement for a few days.  Please Note:  You might notice some irritation and congestion in your nose or some drainage.  This is from the oxygen used during your procedure.  There is no need for concern and it should clear up in a day or so.  SYMPTOMS TO REPORT IMMEDIATELY:   Following lower endoscopy (colonoscopy or flexible sigmoidoscopy):  Excessive amounts of blood in the stool  Significant tenderness or worsening of abdominal pains  Swelling of the abdomen that is new, acute  Fever of 100F or higher   For urgent or emergent issues, a gastroenterologist can be reached at any hour by calling [phone]. Do not use MyChart messaging for urgent concerns.    DIET:  We do recommend a small meal at first, but then you may proceed to your regular diet.  Drink plenty of fluids but you should avoid alcoholic beverages for 24 hours.  MEDICATIONS: Continue present medications.  Please see handouts given to you by your recovery nurse.  ACTIVITY:  You should plan to  take it easy for the rest of today and you should NOT DRIVE or use heavy machinery until tomorrow (because of the sedation medicines used during the test).    FOLLOW UP: Our staff will call the number listed on your records 48-72 hours following your procedure to check on you and address any questions or concerns that you may have regarding the information given to you following your procedure. If we do not reach you, we will leave a message.  We will attempt to reach you two times.  During this call, we will ask if you have developed any symptoms of COVID 19. If you develop any symptoms (ie: fever, flu-like symptoms, shortness of breath, cough etc.) before then, please call [phone].  If you test positive for Covid 19 in the 2 weeks post procedure, please call and report this information to us.    If any biopsies were taken you will be contacted by phone or by letter within the next 1-3 weeks.  Please call us at [phone] if you have not heard about the biopsies in 3 weeks.   Thank you for allowing us to provide for your healthcare needs today.   SIGNATURES/CONFIDENTIALITY: You and/or your care partner have signed paperwork which will be entered into your electronic medical record.  These signatures attest to the fact that that the information above on your After Visit Summary has been reviewed and is understood.  Full responsibility of the   confidentiality of this discharge information lies with you and/or your care-partner. 

## 2020-03-16 NOTE — Progress Notes (Signed)
To PACU, vss. Report to [email or web address] 

## 2020-03-18 ENCOUNTER — Telehealth: Payer: Self-pay

## 2020-03-18 NOTE — Telephone Encounter (Signed)
  Follow up Call-  Call back number 03/16/2020  Post procedure Call Back phone  # [phone]  Permission to leave phone message Yes  Some recent data might be hidden     Patient questions:  Do you have a fever, pain , or abdominal swelling? No. Pain Score  0 *  Have you tolerated food without any problems? Yes.    Have you been able to return to your normal activities? Yes.    Do you have any questions about your discharge instructions: Diet   No. Medications  No. Follow up visit  No.  Do you have questions or concerns about your Care? No.  Actions: * If pain score is 4 or above: No action needed, pain <4.   1. Have you developed a fever since your procedure? no  2.   Have you had an respiratory symptoms (SOB or cough) since your procedure? no  3.   Have you tested positive for COVID 19 since your procedure? No   4.   Have you had any family members/close contacts diagnosed with the COVID 19 since your procedure?  no   If yes to any of these questions please route to Laverna Peace, RN and Charlett Lango, RN

## 2020-03-18 NOTE — Telephone Encounter (Signed)
No answer on follow up call. 

## 2020-03-25 ENCOUNTER — Encounter: Payer: Self-pay | Admitting: Registered Nurse

## 2020-03-25 LAB — HM DIABETES EYE EXAM

## 2020-04-22 ENCOUNTER — Telehealth: Payer: Self-pay | Admitting: Registered Nurse

## 2020-04-22 ENCOUNTER — Encounter: Payer: Self-pay | Admitting: Registered Nurse

## 2020-04-22 MED ORDER — IBUPROFEN 800 MG PO TABS: 800.0000 mg | ORAL_TABLET | Freq: Three times a day (TID) | ORAL | 0 refills | Status: DC | PRN

## 2020-04-22 NOTE — Telephone Encounter (Signed)
Noted patient filling lisinopril at [facility].

## 2020-04-22 NOTE — Telephone Encounter (Signed)
Pt confirms getting new increased Lisinopril dose from [facility].

## 2020-04-22 NOTE — Telephone Encounter (Signed)
Patient requested refill of ibuprofen 800mg  po TID prn pain from [facility] formulary.  Last fill 02/26/2018 per paper chart review.  Motrin prn use for back/leg pain intermittent.  Take with food as can cause gastritis.  May also counteract blood pressure medications notify clinic staff if headache with motrin use.  Patient also received metformin 1000mg  po BID #180 and lipitor 20mg  po daily #90  From [facility] formulary today.

## 2020-05-25 ENCOUNTER — Ambulatory Visit (INDEPENDENT_AMBULATORY_CARE_PROVIDER_SITE_OTHER): Payer: PRIVATE HEALTH INSURANCE

## 2020-05-25 ENCOUNTER — Ambulatory Visit: Payer: PRIVATE HEALTH INSURANCE | Admitting: Registered Nurse

## 2020-05-25 ENCOUNTER — Other Ambulatory Visit: Payer: Self-pay

## 2020-05-25 ENCOUNTER — Encounter: Payer: Self-pay | Admitting: Registered Nurse

## 2020-05-25 VITALS — BP 144/82 | HR 100 | Temp 98.1°F | Resp 17 | Ht 64.8 in | Wt 186.0 lb

## 2020-05-25 DIAGNOSIS — M5416 Radiculopathy, lumbar region: Secondary | ICD-10-CM | POA: Diagnosis not present

## 2020-05-25 MED ORDER — TRAMADOL HCL 50 MG PO TABS: 50.0000 mg | ORAL_TABLET | Freq: Every evening | ORAL | 0 refills | Status: DC | PRN

## 2020-05-25 MED ORDER — DICLOFENAC SODIUM 75 MG PO TBEC: 75.0000 mg | DELAYED_RELEASE_TABLET | Freq: Two times a day (BID) | ORAL | 0 refills | Status: DC

## 2020-05-25 MED ORDER — METHOCARBAMOL 500 MG PO TABS: 500.0000 mg | ORAL_TABLET | Freq: Three times a day (TID) | ORAL | 0 refills | Status: DC | PRN

## 2020-05-25 NOTE — Patient Instructions (Signed)
° ° ° °  If you have lab work done today you will be contacted with your lab results within the next 2 weeks.  If you have not heard from us then please contact us. The fastest way to get your results is to register for My Chart. ° ° °IF you received an x-ray today, you will receive an invoice from [facility]. Please contact [facility] at [phone] with questions or concerns regarding your invoice.  ° °IF you received labwork today, you will receive an invoice from [facility]. Please contact [facility] at [phone] with questions or concerns regarding your invoice.  ° °Our billing staff will not be able to assist you with questions regarding bills from these companies. ° °You will be contacted with the lab results as soon as they are available. The fastest way to get your results is to activate your My Chart account. Instructions are located on the last page of this paperwork. If you have not heard from us regarding the results in 2 weeks, please contact this office. °  ° ° ° °

## 2020-05-25 NOTE — Progress Notes (Signed)
Acute Office Visit  Subjective:    Patient ID: Robin Chen, female    DOB: Jun 09, 1961, 59 y.o.   MRN: [id number]  Chief Complaint  Patient presents with  . Back Pain    Patient states she has been having problems with her back and spine since last year on the right side now the left side is starting to hurt.    HPI Patient is in today for lower back pain w right side radiculopathy  Sudden onset on Sunday evening. No heavy lifting or acute injury noted - happened as she was stepping into the bath. No fall. Has been taking OTCs and rest - minimal relief. Notes some improvement since Monday morning but still having difficulty walking, work has been impossible.   Has had this happen before with L side radiculopathy, states that she was seen by neurosurgeon who recommended surgical intervention given degenerative changes. These can be seen on CT of abdomen in 2018  No other concerns. Denies saddle symptoms. No CNS symptoms.  Past Medical History:  Diagnosis Date  . Anemia   . Blood transfusion    2003  . Chronic kidney disease    Rt UPJ stone  . COPD (chronic obstructive pulmonary disease) (HCC)   . Diabetes mellitus without complication (HCC)   . Headache(784.0)   . Hyperlipidemia   . Hypertension     Past Surgical History:  Procedure Laterality Date  . CYSTOSCOPY W/ URETERAL STENT PLACEMENT    . DILATION AND CURETTAGE OF UTERUS     20 03    Family History  Problem Relation Age of Onset  . Cancer Mother   . Colon cancer Neg Hx   . Colon polyps Neg Hx   . Esophageal cancer Neg Hx   . Stomach cancer Neg Hx   . Rectal cancer Neg Hx     Social History   Socioeconomic History  . Marital status: Married    Spouse name: Miralem  . Number of children: 3  . Years of education: Not on file  . Highest education level: Not on file  Occupational History  . Not on file  Tobacco Use  . Smoking status: Current Every Day Smoker    Packs/day: 1.00    Years: 30.00    Pack  years: 30.00    Types: Cigarettes  . Smokeless tobacco: Never Used  Vaping Use  . Vaping Use: Never used  Substance and Sexual Activity  . Alcohol use: No  . Drug use: No  . Sexual activity: Yes  Other Topics Concern  . Not on file  Social History Narrative  . Not on file   Social Determinants of Health   Financial Resource Strain:   . Difficulty of Paying Living Expenses: Not on file  Food Insecurity:   . Worried About 04 in the Last Year: Not on file  . Ran Out of Food in the Last Year: Not on file  Transportation Needs:   . Lack of Transportation (Medical): Not on file  . Lack of Transportation (Non-Medical): Not on file  Physical Activity:   . Days of Exercise per Week: Not on file  . Minutes of Exercise per Session: Not on file  Stress:   . Feeling of Stress : Not on file  Social Connections:   . Frequency of Communication with Friends and Family: Not on file  . Frequency of Social Gatherings with Friends and Family: Not on file  . Attends Religious Services: Not on  file  . Active Member of Clubs or Organizations: Not on file  . Attends Banker Meetings: Not on file  . Marital Status: Not on file  Intimate Partner Violence:   . Fear of Current or Ex-Partner: Not on file  . Emotionally Abused: Not on file  . Physically Abused: Not on file  . Sexually Abused: Not on file    Outpatient Medications Prior to Visit  Medication Sig Dispense Refill  . atorvastatin (LIPITOR) 20 MG tablet Take 1 tablet (20 mg total) by mouth daily. 90 tablet 1  . ibuprofen (ADVIL) 800 MG tablet Take 1 tablet (800 mg total) by mouth every 8 (eight) hours as needed for mild pain or moderate pain. 30 tablet 0  . lisinopril (ZESTRIL) 5 MG tablet Take 1 tablet (5 mg total) by mouth daily. 90 tablet 1  . metFORMIN (GLUCOPHAGE) 1000 MG tablet Take 1 tablet (1,000 mg total) by mouth 2 (two) times daily with a meal. 180 tablet 1  . albuterol (VENTOLIN HFA) 108 (90  Base) MCG/ACT inhaler  (Patient not taking: Reported on 02/27/2020)    . Spacer/Aero-Holding Chambers (EQ SPACE CHAMBER ANTI-STATIC) DEVI  (Patient not taking: Reported on 02/27/2020)     Facility-Administered Medications Prior to Visit  Medication Dose Route Frequency Provider Last Rate Last Admin  . 0.9 %  sodium chloride infusion  500 mL Intravenous Once Sherrilyn Rist, MD        Allergies  Allergen Reactions  . Penicillins Rash and Other (See Comments)    Has patient had a PCN reaction causing immediate rash, facial/tongue/throat swelling, SOB or lightheadedness with hypotension: No Has patient had a PCN reaction causing severe rash involving mucus membranes or skin necrosis: No Has patient had a PCN reaction that required hospitalization No Has patient had a PCN reaction occurring within the last 10 years: No If all of the above answers are "NO", then may proceed with Cephalosporin use.  . Ciprofloxacin Rash    Review of Systems Per hpi      Objective:    Physical Exam Vitals and nursing note reviewed.  Constitutional:      General: She is not in acute distress.    Appearance: Normal appearance. She is not ill-appearing, toxic-appearing or diaphoretic.  Musculoskeletal:        General: Tenderness and signs of injury present. No swelling or deformity. Normal range of motion.     Right lower leg: No edema.     Left lower leg: No edema.  Skin:    General: Skin is warm and dry.  Neurological:     General: No focal deficit present.     Mental Status: She is alert and oriented to person, place, and time. Mental status is at baseline.  Psychiatric:        Mood and Affect: Mood normal.        Behavior: Behavior normal.        Thought Content: Thought content normal.        Judgment: Judgment normal.     BP (!) 144/82   Pulse 100   Temp 98.1 F (36.7 C) (Temporal)   Resp 17   Ht 5' 4.8" (1.646 m)   Wt 186 lb (84.4 kg)   SpO2 98%   BMI 31.14 kg/m  Wt Readings  from Last 3 Encounters:  05/25/20 186 lb (84.4 kg)  03/16/20 177 lb (80.3 kg)  02/27/20 177 lb (80.3 kg)    Health Maintenance Due  Topic Date Due  . INFLUENZA VACCINE  Never done    There are no preventive care reminders to display for this patient.   Lab Results  Component Value Date   TSH 1.710 01/20/2020   Lab Results  Component Value Date   WBC 8.1 01/20/2020   HGB 14.9 01/20/2020   HCT 44.0 01/20/2020   MCV 91 01/20/2020   PLT 195 01/20/2020   Lab Results  Component Value Date   NA 141 01/20/2020   K 4.4 01/20/2020   CO2 20 12/08/2017   GLUCOSE 100 (H) 01/20/2020   BUN 18 01/20/2020   CREATININE 0.67 01/20/2020   BILITOT 0.5 01/20/2020   ALKPHOS 100 01/20/2020   AST 27 01/20/2020   ALT 22 01/20/2020   PROT 7.3 01/20/2020   ALBUMIN 5.0 (H) 01/20/2020   CALCIUM 10.4 (H) 01/20/2020   ANIONGAP 9 08/10/2017   Lab Results  Component Value Date   CHOL 181 01/20/2020   Lab Results  Component Value Date   HDL 66 01/20/2020   Lab Results  Component Value Date   LDLCALC 92 01/20/2020   Lab Results  Component Value Date   TRIG 133 01/20/2020   Lab Results  Component Value Date   CHOLHDL 2.7 01/20/2020   Lab Results  Component Value Date   HGBA1C 7.0 (H) 01/20/2020       Assessment & Plan:   Problem List Items Addressed This Visit    None    Visit Diagnoses    Lumbar back pain with radiculopathy affecting right lower extremity    -  Primary   Relevant Medications   traMADol (ULTRAM) 50 MG tablet   methocarbamol (ROBAXIN) 500 MG tablet   diclofenac (VOLTAREN) 75 MG EC tablet   Other Relevant Orders   DG Lumbar Spine Complete (Completed)       Meds ordered this encounter  Medications  . traMADol (ULTRAM) 50 MG tablet    Sig: Take 1 tablet (50 mg total) by mouth at bedtime as needed for up to 15 doses.    Dispense:  15 tablet    Refill:  0    Order Specific Question:   Supervising Provider    Answer:   Neva Seat, JEFFREY R [[id number]]  .  methocarbamol (ROBAXIN) 500 MG tablet    Sig: Take 1 tablet (500 mg total) by mouth every 8 (eight) hours as needed for muscle spasms.    Dispense:  60 tablet    Refill:  0    Order Specific Question:   Supervising Provider    Answer:   Neva Seat, JEFFREY R [[id number]]  . diclofenac (VOLTAREN) 75 MG EC tablet    Sig: Take 1 tablet (75 mg total) by mouth 2 (two) times daily.    Dispense:  30 tablet    Refill:  0    Order Specific Question:   Supervising Provider    Answer:   Neva Seat, JEFFREY R [[id number]]   PLAN  Xray shows advancing of degenerative changes at multiple levels of L spine.   Will give diclofenac, robaxin, and tramadol for control of pain. Rest and Ice/heat. Will suggest PT if relief is not rapid.  Will continue to work with patient to discuss if surgical intervention may be right for her   Patient encouraged to call clinic with any questions, comments, or concerns.  Janeece Agee, NP

## 2020-05-25 NOTE — Progress Notes (Signed)
If we could call patient -   Her Xray results are back, they show degeneration not only at L3-L4, but also L1-L2 and L4-L5.   If she doesn't feel much better in the coming days then we may have to consider getting her in touch with the surgeon. However, I am hoping that's not the case.  Thank you  Jari Sportsman, NP

## 2020-07-06 LAB — HM MAMMOGRAPHY

## 2020-08-02 ENCOUNTER — Ambulatory Visit: Payer: PRIVATE HEALTH INSURANCE | Admitting: Registered Nurse

## 2020-08-11 ENCOUNTER — Other Ambulatory Visit: Payer: Self-pay

## 2020-08-11 ENCOUNTER — Ambulatory Visit: Payer: PRIVATE HEALTH INSURANCE | Admitting: Registered Nurse

## 2020-08-11 ENCOUNTER — Encounter: Payer: Self-pay | Admitting: Registered Nurse

## 2020-08-11 VITALS — BP 149/84 | HR 91 | Temp 98.0°F | Resp 18 | Ht 64.8 in | Wt 186.4 lb

## 2020-08-11 DIAGNOSIS — M5416 Radiculopathy, lumbar region: Secondary | ICD-10-CM | POA: Diagnosis not present

## 2020-08-11 DIAGNOSIS — E119 Type 2 diabetes mellitus without complications: Secondary | ICD-10-CM | POA: Diagnosis not present

## 2020-08-11 LAB — POCT GLYCOSYLATED HEMOGLOBIN (HGB A1C): Hemoglobin A1C: 6.7 % — AB (ref 4.0–5.6)

## 2020-08-11 NOTE — Patient Instructions (Signed)
° ° ° °  If you have lab work done today you will be contacted with your lab results within the next 2 weeks.  If you have not heard from us then please contact us. The fastest way to get your results is to register for My Chart. ° ° °IF you received an x-ray today, you will receive an invoice from [facility]. Please contact [facility] at [phone] with questions or concerns regarding your invoice.  ° °IF you received labwork today, you will receive an invoice from [facility]. Please contact [facility] at [phone] with questions or concerns regarding your invoice.  ° °Our billing staff will not be able to assist you with questions regarding bills from these companies. ° °You will be contacted with the lab results as soon as they are available. The fastest way to get your results is to activate your My Chart account. Instructions are located on the last page of this paperwork. If you have not heard from us regarding the results in 2 weeks, please contact this office. °  ° ° ° °

## 2020-08-12 LAB — COMPREHENSIVE METABOLIC PANEL
ALT: 28 IU/L (ref 0–32)
AST: 29 IU/L (ref 0–40)
Albumin/Globulin Ratio: 2.3 — ABNORMAL HIGH (ref 1.2–2.2)
Albumin: 5.1 g/dL — ABNORMAL HIGH (ref 3.8–4.9)
Alkaline Phosphatase: 96 IU/L (ref 44–121)
BUN/Creatinine Ratio: 33 — ABNORMAL HIGH (ref 12–28)
BUN: 22 mg/dL (ref 8–27)
Bilirubin Total: 0.4 mg/dL (ref 0.0–1.2)
CO2: 24 mmol/L (ref 20–29)
Calcium: 9.7 mg/dL (ref 8.7–10.3)
Chloride: 103 mmol/L (ref 96–106)
Creatinine, Ser: 0.67 mg/dL (ref 0.57–1.00)
GFR calc Af Amer: 110 mL/min/{1.73_m2} (ref >59)
GFR calc non Af Amer: 96 mL/min/{1.73_m2} (ref >59)
Globulin, Total: 2.2 g/dL (ref 1.5–4.5)
Glucose: 95 mg/dL (ref 65–99)
Potassium: 4.6 mmol/L (ref 3.5–5.2)
Sodium: 142 mmol/L (ref 134–144)
Total Protein: 7.3 g/dL (ref 6.0–8.5)

## 2020-08-12 LAB — LIPID PANEL
Chol/HDL Ratio: 3.1 ratio (ref 0.0–4.4)
Cholesterol, Total: 166 mg/dL (ref 100–199)
HDL: 53 mg/dL (ref >39)
LDL Chol Calc (NIH): 88 mg/dL (ref 0–99)
Triglycerides: 144 mg/dL (ref 0–149)
VLDL Cholesterol Cal: 25 mg/dL (ref 5–40)

## 2020-08-12 LAB — MICROALBUMIN / CREATININE URINE RATIO
Creatinine, Urine: 169.7 mg/dL
Microalb/Creat Ratio: 9 mg/g creat (ref 0–29)
Microalbumin, Urine: 15.2 ug/mL

## 2020-09-07 ENCOUNTER — Encounter: Payer: Self-pay | Admitting: Radiology

## 2020-10-19 ENCOUNTER — Other Ambulatory Visit: Payer: Self-pay | Admitting: Registered Nurse

## 2020-10-19 DIAGNOSIS — E119 Type 2 diabetes mellitus without complications: Secondary | ICD-10-CM

## 2020-10-19 DIAGNOSIS — I152 Hypertension secondary to endocrine disorders: Secondary | ICD-10-CM

## 2020-10-19 DIAGNOSIS — E1159 Type 2 diabetes mellitus with other circulatory complications: Secondary | ICD-10-CM

## 2020-11-12 ENCOUNTER — Encounter: Payer: Self-pay | Admitting: Registered Nurse

## 2020-11-12 NOTE — Progress Notes (Signed)
Established Patient Office Visit  Subjective:  Patient ID: Robin Chen, female    DOB: [birth year]  Age: 60 y.o. MRN: [id number]  CC:  Chief Complaint  Patient presents with  . Follow-up    Patient states she is here for her 6 month follow up in lower back pain . Patient states she is doing alot better if she feel any pain ibuprofen has been helping.    HPI Robin Chen presents for 6 mo follow up   Back pain Improved with nonpharm and OTC analgesics.  No worsening symptoms. Feeling much improved, no further complaints.  t2dm Last A1c: 7.0 on 01/20/20 Currently taking: none No new complications Reports good compliance lifestyle changes    Past Medical History:  Diagnosis Date  . Anemia   . Blood transfusion    2003  . Chronic kidney disease    Rt UPJ stone  . COPD (chronic obstructive pulmonary disease) (HCC)   . Diabetes mellitus without complication (HCC)   . Headache(784.0)   . Hyperlipidemia   . Hypertension     Past Surgical History:  Procedure Laterality Date  . CYSTOSCOPY W/ URETERAL STENT PLACEMENT    . DILATION AND CURETTAGE OF UTERUS     2003    Family History  Problem Relation Age of Onset  . Cancer Mother   . Colon cancer Neg Hx   . Colon polyps Neg Hx   . Esophageal cancer Neg Hx   . Stomach cancer Neg Hx   . Rectal cancer Neg Hx     Social History   Socioeconomic History  . Marital status: Married    Spouse name: Miralem  . Number of children: 3  . Years of education: Not on file  . Highest education level: Not on file  Occupational History  . Not on file  Tobacco Use  . Smoking status: Current Every Day Smoker    Packs/day: 1.00    Years: 30.00    Pack years: 30.00    Types: Cigarettes  . Smokeless tobacco: Never Used  Vaping Use  . Vaping Use: Never used  Substance and Sexual Activity  . Alcohol use: No  . Drug use: No  . Sexual activity: Yes  Other Topics Concern  . Not on file  Social History Narrative  . Not on  file   Social Determinants of Health   Financial Resource Strain: Not on file  Food Insecurity: Not on file  Transportation Needs: Not on file  Physical Activity: Not on file  Stress: Not on file  Social Connections: Not on file  Intimate Partner Violence: Not on file    Outpatient Medications Prior to Visit  Medication Sig Dispense Refill  . atorvastatin (LIPITOR) 20 MG tablet Take 1 tablet (20 mg total) by mouth daily. 90 tablet 1  . diclofenac (VOLTAREN) 75 MG EC tablet Take 1 tablet (75 mg total) by mouth 2 (two) times daily. 30 tablet 0  . ibuprofen (ADVIL) 800 MG tablet Take 1 tablet (800 mg total) by mouth every 8 (eight) hours as needed for mild pain or moderate pain. 30 tablet 0  . methocarbamol (ROBAXIN) 500 MG tablet Take 1 tablet (500 mg total) by mouth every 8 (eight) hours as needed for muscle spasms. 60 tablet 0  . Spacer/Aero-Holding Chambers (EQ SPACE CHAMBER ANTI-STATIC) DEVI     . traMADol (ULTRAM) 50 MG tablet Take 1 tablet (50 mg total) by mouth at bedtime as needed for up to 15 doses. 15 tablet  0  . lisinopril (ZESTRIL) 5 MG tablet Take 1 tablet (5 mg total) by mouth daily. 90 tablet 1  . albuterol (VENTOLIN HFA) 108 (90 Base) MCG/ACT inhaler  (Patient not taking: Reported on 08/11/2020)    . metFORMIN (GLUCOPHAGE) 1000 MG tablet Take 1 tablet (1,000 mg total) by mouth 2 (two) times daily with a meal. 180 tablet 1   Facility-Administered Medications Prior to Visit  Medication Dose Route Frequency Provider Last Rate Last Admin  . 0.9 %  sodium chloride infusion  500 mL Intravenous Once Sherrilyn Rist, MD        Allergies  Allergen Reactions  . Penicillins Rash and Other (See Comments)    Has patient had a PCN reaction causing immediate rash, facial/tongue/throat swelling, SOB or lightheadedness with hypotension: No Has patient had a PCN reaction causing severe rash involving mucus membranes or skin necrosis: No Has patient had a PCN reaction that required  hospitalization No Has patient had a PCN reaction occurring within the last 10 years: No If all of the above answers are "NO", then may proceed with Cephalosporin use.  . Ciprofloxacin Rash    ROS Review of Systems  Constitutional: Negative.   HENT: Negative.   Eyes: Negative.   Respiratory: Negative.   Cardiovascular: Negative.   Gastrointestinal: Negative.   Endocrine: Negative.   Genitourinary: Negative.   Musculoskeletal: Negative.   Skin: Negative.   Allergic/Immunologic: Negative.   Neurological: Negative.   Psychiatric/Behavioral: Negative.   All other systems reviewed and are negative.     Objective:    Physical Exam Vitals and nursing note reviewed.  Constitutional:      General: She is not in acute distress.    Appearance: Normal appearance. She is normal weight. She is not ill-appearing, toxic-appearing or diaphoretic.  Cardiovascular:     Rate and Rhythm: Normal rate and regular rhythm.     Heart sounds: Normal heart sounds. No murmur heard. No friction rub. No gallop.   Pulmonary:     Effort: Pulmonary effort is normal. No respiratory distress.     Breath sounds: Normal breath sounds. No stridor. No wheezing, rhonchi or rales.  Chest:     Chest wall: No tenderness.  Musculoskeletal:        General: No swelling or tenderness. Normal range of motion.  Skin:    General: Skin is warm and dry.  Neurological:     General: No focal deficit present.     Mental Status: She is alert and oriented to person, place, and time. Mental status is at baseline.  Psychiatric:        Mood and Affect: Mood normal.        Behavior: Behavior normal.        Thought Content: Thought content normal.        Judgment: Judgment normal.     BP (!) 149/84   Pulse 91   Temp 98 F (36.7 C) (Temporal)   Resp 18   Ht 5' 4.8" (1.646 m)   Wt 186 lb 6.4 oz (84.6 kg)   SpO2 98%   BMI 31.21 kg/m  Wt Readings from Last 3 Encounters:  08/11/20 186 lb 6.4 oz (84.6 kg)  05/25/20  186 lb (84.4 kg)  03/16/20 177 lb (80.3 kg)     Health Maintenance Due  Topic Date Due  . COVID-19 Vaccine (3 - Booster for Pfizer series) 06/29/2020  . PAP SMEAR-Modifier  12/08/2020    There are no preventive care  reminders to display for this patient.  Lab Results  Component Value Date   TSH 1.710 01/20/2020   Lab Results  Component Value Date   WBC 8.1 01/20/2020   HGB 14.9 01/20/2020   HCT 44.0 01/20/2020   MCV 91 01/20/2020   PLT 195 01/20/2020   Lab Results  Component Value Date   NA 142 08/11/2020   K 4.6 08/11/2020   CO2 24 08/11/2020   GLUCOSE 95 08/11/2020   BUN 22 08/11/2020   CREATININE 0.67 08/11/2020   BILITOT 0.4 08/11/2020   ALKPHOS 96 08/11/2020   AST 29 08/11/2020   ALT 28 08/11/2020   PROT 7.3 08/11/2020   ALBUMIN 5.1 (H) 08/11/2020   CALCIUM 9.7 08/11/2020   ANIONGAP 9 08/10/2017   Lab Results  Component Value Date   CHOL 166 08/11/2020   Lab Results  Component Value Date   HDL 53 08/11/2020   Lab Results  Component Value Date   LDLCALC 88 08/11/2020   Lab Results  Component Value Date   TRIG 144 08/11/2020   Lab Results  Component Value Date   CHOLHDL 3.1 08/11/2020   Lab Results  Component Value Date   HGBA1C 6.7 (A) 08/11/2020      Assessment & Plan:   Problem List Items Addressed This Visit      Endocrine   Type 2 diabetes mellitus without complication, without long-term current use of insulin (HCC) - Primary   Relevant Orders   POCT glycosylated hemoglobin (Hb A1C) (Completed)   Lipid Panel (Completed)   Comprehensive metabolic panel (Completed)   Microalbumin / creatinine urine ratio (Completed)      No orders of the defined types were placed in this encounter.   Follow-up: No follow-ups on file.   PLAN  Pt doing well clinically and from lab perspective  Continue healthy lifestyle  Labs collected. Will follow up with the patient as warranted.  Patient encouraged to call clinic with any questions,  comments, or concerns.  Janeece Agee, NP

## 2021-05-12 ENCOUNTER — Encounter: Payer: Self-pay | Admitting: Registered Nurse

## 2021-05-12 ENCOUNTER — Telehealth: Payer: Self-pay | Admitting: Registered Nurse

## 2021-05-12 DIAGNOSIS — M5442 Lumbago with sciatica, left side: Secondary | ICD-10-CM

## 2021-05-12 MED ORDER — IBUPROFEN 800 MG PO TABS: 800.0000 mg | ORAL_TABLET | Freq: Three times a day (TID) | ORAL | 0 refills | Status: DC | PRN

## 2021-05-12 NOTE — Telephone Encounter (Signed)
Patient requested PDRx fill motrin 800mg  po TID prn pain.  Takes for back pain/sciatica chronic prn.  Last fill PDRx 04/22/2020 per paper chart onsite [facility] clinic.  Labs 08/11/2020 and PCM visit VSS 149/84 Pulse 91  Patient to continue follow up with [facility] and annual labs due Dec 2022/annual visit.  Patient verbalized understanding information/instructions, agreed with plan of care and had no further questions at this time.  Results for GASZerenity, Bowron (MRN Leticia Clas) as of 05/12/2021 14:04  Ref. Range 08/11/2020 16:27  Sodium Latest Ref Range: 134 - 144 mmol/L 142  Potassium Latest Ref Range: 3.5 - 5.2 mmol/L 4.6  Chloride Latest Ref Range: 96 - 106 mmol/L 103  CO2 Latest Ref Range: 20 - 29 mmol/L 24  Glucose Latest Ref Range: 65 - 99 mg/dL 95  BUN Latest Ref Range: 8 - 27 mg/dL 22  Creatinine Latest Ref Range: 0.57 - 1.00 mg/dL 14/09/2019  Calcium Latest Ref Range: 8.7 - 10.3 mg/dL 9.7  BUN/Creatinine Ratio Latest Ref Range: 12 - 28  33 (H)  Alkaline Phosphatase Latest Ref Range: 44 - 121 IU/L 96  Albumin Latest Ref Range: 3.8 - 4.9 g/dL 5.1 (H)  Albumin/Globulin Ratio Latest Ref Range: 1.2 - 2.2  2.3 (H)  AST Latest Ref Range: 0 - 40 IU/L 29  ALT Latest Ref Range: 0 - 32 IU/L 28  Total Protein Latest Ref Range: 6.0 - 8.5 g/dL 7.3  Total Bilirubin Latest Ref Range: 0.0 - 1.2 mg/dL 0.4  GFR, Est Non African American Latest Ref Range: >59 mL/min/1.73 96  GFR, Est African American Latest Ref Range: >59 mL/min/1.73 110  Total CHOL/HDL Ratio Latest Ref Range: 0.0 - 4.4 ratio 3.1  Cholesterol, Total Latest Ref Range: 100 - 199 mg/dL 4.98  HDL Cholesterol Latest Ref Range: >39 mg/dL 53  Triglycerides Latest Ref Range: 0 - 149 mg/dL 264  VLDL Cholesterol Cal Latest Ref Range: 5 - 40 mg/dL 25  LDL Chol Calc (NIH) Latest Ref Range: 0 - 99 mg/dL 88  Globulin, Total Latest Ref Range: 1.5 - 4.5 g/dL 2.2

## 2021-07-21 ENCOUNTER — Telehealth: Payer: Self-pay | Admitting: Registered Nurse

## 2021-07-21 ENCOUNTER — Encounter: Payer: Self-pay | Admitting: Registered Nurse

## 2021-07-21 DIAGNOSIS — M5442 Lumbago with sciatica, left side: Secondary | ICD-10-CM

## 2021-07-21 MED ORDER — IBUPROFEN 800 MG PO TABS: 800.0000 mg | ORAL_TABLET | Freq: Three times a day (TID) | ORAL | 0 refills | Status: AC | PRN

## 2021-07-21 NOTE — Telephone Encounter (Signed)
Patient last filled ibuprofen 800mg  po TID prn pain 05/12/21 for back pain/sciatica from PDRx.  Refilled for patient today 90 tabs from PDRx.  Last labs 08/11/2020 normal renal function/LFTs with PCM appt.  Notified RN 14/09/2019 to discuss with patient annual labs due Dec 2022 and follow up with PCM.  Patient to discuss new Rx with her PCM also.

## 2021-09-28 ENCOUNTER — Other Ambulatory Visit: Payer: Self-pay | Admitting: Registered Nurse

## 2021-09-28 DIAGNOSIS — Z1231 Encounter for screening mammogram for malignant neoplasm of breast: Secondary | ICD-10-CM

## 2021-10-03 ENCOUNTER — Inpatient Hospital Stay: Admission: RE | Admit: 2021-10-03 | Payer: No Typology Code available for payment source | Source: Ambulatory Visit

## 2021-10-04 ENCOUNTER — Other Ambulatory Visit: Payer: Self-pay

## 2021-10-04 ENCOUNTER — Ambulatory Visit
Admission: RE | Admit: 2021-10-04 | Discharge: 2021-10-04 | Disposition: A | Discharge status: Home / Self Care | Payer: No Typology Code available for payment source | Source: Ambulatory Visit | Attending: Registered Nurse | Admitting: Registered Nurse

## 2021-10-04 DIAGNOSIS — Z1231 Encounter for screening mammogram for malignant neoplasm of breast: Secondary | ICD-10-CM

## 2021-11-03 ENCOUNTER — Other Ambulatory Visit: Payer: Self-pay

## 2021-11-03 ENCOUNTER — Ambulatory Visit: Payer: Self-pay | Admitting: Registered Nurse

## 2021-11-03 VITALS — BP 141/78 | HR 92 | Temp 98.7°F

## 2021-11-03 DIAGNOSIS — E1169 Type 2 diabetes mellitus with other specified complication: Secondary | ICD-10-CM

## 2021-11-03 DIAGNOSIS — R739 Hyperglycemia, unspecified: Secondary | ICD-10-CM

## 2021-11-03 DIAGNOSIS — E559 Vitamin D deficiency, unspecified: Secondary | ICD-10-CM

## 2021-11-03 DIAGNOSIS — N3000 Acute cystitis without hematuria: Secondary | ICD-10-CM

## 2021-11-03 DIAGNOSIS — R8290 Unspecified abnormal findings in urine: Secondary | ICD-10-CM

## 2021-11-03 LAB — POCT URINALYSIS DIPSTICK
Bilirubin, UA: NEGATIVE
Blood, UA: NEGATIVE
Glucose, UA: NEGATIVE mg/dL
Ketones, POC UA: NEGATIVE mg/dL
Nitrite, UA: POSITIVE — AB
Protein Ur, POC: NEGATIVE mg/dL
Spec Grav, UA: 1.015 (ref 1.010–1.025)
Urobilinogen, UA: 0.2 E.U./dL
pH, UA: 7 (ref 5.0–8.0)

## 2021-11-03 MED ORDER — SULFAMETHOXAZOLE-TRIMETHOPRIM 800-160 MG PO TABS: 1.0000 | ORAL_TABLET | Freq: Two times a day (BID) | ORAL | 0 refills | Status: AC

## 2021-11-03 NOTE — Patient Instructions (Signed)
Living With Diabetes Diabetes (type 1 diabetes mellitus or type 2 diabetes mellitus) is a condition in which the body does not have enough of a hormone called insulin, or the body does not respond properly to insulin. Normally, insulin allows sugars (glucose) to enter cells in the body. With diabetes, extra glucose builds up in the blood instead of going into cells. This results in high blood glucose (hyperglycemia). How to manage lifestyle changes Managing diabetes includes medical treatments as well as lifestyle changes. If diabetes is not managed well, serious physical and emotional complications can occur. Taking good care of yourself means that you are responsible for: Monitoring glucose regularly. Eating a healthy diet. Exercising regularly. Meeting with health care providers. Taking medicines as directed. Most people feel some stress about managing their diabetes. When this stress becomes too much, it is known as diabetes-related distress. This is very common. Living with diabetes can place you at risk for diabetes distress, depression, or anxiety. These disorders can make diabetes more difficult to manage. How to recognize stress You may have diabetes distress if you: Avoid or ignore your daily diabetes care. This includes glucose testing, following a meal plan, and taking medications. Feel overwhelmed by your daily diabetes care. Experience emotional reactions such as anger, sadness, or fear related to your daily diabetes care. Feel fear or shame about not doing everything perfectly that you have been told to do. Emotional distress Symptoms of diabetes distress include: Anger about having a diagnosis of diabetes. Fear or frustration about your diagnosis and the changes you need to make to manage the condition. Being overly worried about the care that you need or the cost of the care that you need. Feeling like you caused your condition by doing something wrong. Fear about unpredictable  fluctuations in your blood glucose, like low or high blood glucose. Feeling judged by your health care providers. Feeling very alone with the disease. Depression Having diabetes means that you are at a higher risk for depression. Your health care provider may test (screen) you for symptoms of depression. It is important to recognize symptoms and to start treatment for depression soon after it is diagnosed. The following are some symptoms of depression: Loss of interest in things that you used to enjoy. Feeling depressed much or most of the time. A change in appetite. Trouble getting to sleep or staying asleep. Feeling tired most of the day. Feeling nervous and anxious. Feeling guilty and worrying that you are a burden to others. Having thoughts of hurting yourself or feeling that you want to die. If you have any of these symptoms, more days than not, for 2 weeks or longer, you may have depression. This would be a good time to contact your health care provider. Follow these instructions at home: Managing diabetes distress The following are some ways to manage emotional distress: Learn as much as you can about diabetes and its treatment. Take one step at a time to improve your management. Meet with a certified diabetes care and education specialist. Take a class to learn how to manage your condition. Consider working with a counselor or therapist. Keep a journal of your thoughts and concerns. Accept that some things are out of your control. Talk with other people who have diabetes. It can help to talk about the distress that you feel. Find ways to manage stress that work for you. These may include art or music therapy, exercise, meditation, and hobbies. Seek support from spiritual leaders, family, and friends.  General instructions Do your best to follow your diabetes management plan. If you are struggling to follow your plan, talk with a certified diabetes care and education specialist, or  with someone else who has diabetes. They may have ideas that will help. Forgive yourself for not being perfect. Almost everyone struggles with the tasks of diabetes. Keep all follow-up visits. This is important. Where to find support Search for information and support from the American Diabetes Association: www.diabetes.org Find a certified diabetes education and care specialist. Make an appointment through the Association of Diabetes Care & Education Specialists: www.diabeteseducator.org Contact a health care provider if: You believe your diabetes is getting out of control. You are concerned you may be depressed. You think your medications are not helping control your diabetes. You are feeling overwhelmed with your diabetes. Get help right away if: You have thoughts about hurting yourself or others. If you ever feel like you may hurt yourself or others, or have thoughts about taking your own life, get help right away. You can go to your nearest emergency department or call: Your local emergency services (911 in the [place] A suicide crisis helpline, such as the National Suicide Prevention Lifeline at 254-854-1385 or 988 in the [place] This is open 24 hours a day. Summary Diabetes (type 1 diabetes mellitus or type 2 diabetes mellitus) is a condition in which the body does not have enough of a hormone called insulin, or the body does not respond properly to insulin. Living with diabetes puts you at risk for medical and emotional issues, such as diabetes distress, depression, and anxiety. Recognizing the symptoms of diabetes distress and depression may help you avoid problems with your diabetes control. If you experience symptoms, it is important to discuss this with your health care provider, certified diabetes care and education specialist, or therapist. It is important to start treatment for diabetes distress and depression soon after diagnosis. Ask your health care provider to recommend a  therapist who understands both depression and diabetes. This information is not intended to replace advice given to you by your health care provider. Make sure you discuss any questions you have with your health care provider. Document Revised: 03/23/2021 Document Reviewed: 01/08/2020 Elsevier Patient Education  2022 [employer] Urinary Tract Infection, Adult A urinary tract infection (UTI) is an infection of any part of the urinary tract. The urinary tract includes the kidneys, ureters, bladder, and urethra. These organs make, store, and get rid of urine in the body. An upper UTI affects the ureters and kidneys. A lower UTI affects the bladder and urethra. What are the causes? Most urinary tract infections are caused by bacteria in your genital area around your urethra, where urine leaves your body. These bacteria grow and cause inflammation of your urinary tract. What increases the risk? You are more likely to develop this condition if: You have a urinary catheter that stays in place. You are not able to control when you urinate or have a bowel movement (incontinence). You are female and you: Use a spermicide or diaphragm for birth control. Have low estrogen levels. Are pregnant. You have certain genes that increase your risk. You are sexually active. You take antibiotic medicines. You have a condition that causes your flow of urine to slow down, such as: An enlarged prostate, if you are female. Blockage in your urethra. A kidney stone. A nerve condition that affects your bladder control (neurogenic bladder). Not getting enough to drink, or not urinating often. You have certain medical  conditions, such as: Diabetes. A weak disease-fighting system (immunesystem). Sickle cell disease. Gout. Spinal cord injury. What are the signs or symptoms? Symptoms of this condition include: Needing to urinate right away (urgency). Frequent urination. This may include small amounts of urine each  time you urinate. Pain or burning with urination. Blood in the urine. Urine that smells bad or unusual. Trouble urinating. Cloudy urine. Vaginal discharge, if you are female. Pain in the abdomen or the lower back. You may also have: Vomiting or a decreased appetite. Confusion. Irritability or tiredness. A fever or chills. Diarrhea. The first symptom in older adults may be confusion. In some cases, they may not have any symptoms until the infection has worsened. How is this diagnosed? This condition is diagnosed based on your medical history and a physical exam. You may also have other tests, including: Urine tests. Blood tests. Tests for STIs (sexually transmitted infections). If you have had more than one UTI, a cystoscopy or imaging studies may be done to determine the cause of the infections. How is this treated? Treatment for this condition includes: Antibiotic medicine. Over-the-counter medicines to treat discomfort. Drinking enough water to stay hydrated. If you have frequent infections or have other conditions such as a kidney stone, you may need to see a health care provider who specializes in the urinary tract (urologist). In rare cases, urinary tract infections can cause sepsis. Sepsis is a life-threatening condition that occurs when the body responds to an infection. Sepsis is treated in the hospital with IV antibiotics, fluids, and other medicines. Follow these instructions at home: Medicines Take over-the-counter and prescription medicines only as told by your health care provider. If you were prescribed an antibiotic medicine, take it as told by your health care provider. Do not stop using the antibiotic even if you start to feel better. General instructions Make sure you: Empty your bladder often and completely. Do not hold urine for long periods of time. Empty your bladder after sex. Wipe from front to back after urinating or having a bowel movement if you are  female. Use each tissue only one time when you wipe. Drink enough fluid to keep your urine pale yellow. Keep all follow-up visits. This is important. Contact a health care provider if: Your symptoms do not get better after 1-2 days. Your symptoms go away and then return. Get help right away if: You have severe pain in your back or your lower abdomen. You have a fever or chills. You have nausea or vomiting. Summary A urinary tract infection (UTI) is an infection of any part of the urinary tract, which includes the kidneys, ureters, bladder, and urethra. Most urinary tract infections are caused by bacteria in your genital area. Treatment for this condition often includes antibiotic medicines. If you were prescribed an antibiotic medicine, take it as told by your health care provider. Do not stop using the antibiotic even if you start to feel better. Keep all follow-up visits. This is important. This information is not intended to replace advice given to you by your health care provider. Make sure you discuss any questions you have with your health care provider. Document Revised: 04/09/2020 Document Reviewed: 04/09/2020 Elsevier Patient Education  2022 [employer].

## 2021-11-03 NOTE — Progress Notes (Signed)
Subjective:    Patient ID: Robin Chen, female    DOB: 04-02-1961, 61 y.o.   MRN: [id number]  61y/o Caucasian married established female pt c/o daily headaches that occur around 4p each day. Pt is nonfasting today. Has had only peanut butter crackers approx one hour prior to arrival.  Has been taking her prescribed medications every day.  History of type 2 diabetes, hypertension.  Spouse has been having medical problems lately and she stated taking care of him/trying to keep track of all of his new medications and not taking time for herself to go to doctor.  Denied illness/sick contacts.  Eating and drinking without difficulty denied  n/v/d/cough/rhinitis/fever/chills/dyspnea/chest pain.  Hasn't had labs recently/seen [facility] for check up.     Review of Systems  Constitutional:  Negative for activity change, appetite change, chills, diaphoresis, fatigue and fever.  HENT:  Negative for congestion, rhinorrhea, sinus pressure, sinus pain, sneezing, trouble swallowing and voice change.   Eyes:  Negative for photophobia and visual disturbance.  Respiratory:  Negative for cough, chest tightness, shortness of breath, wheezing and stridor.   Cardiovascular:  Negative for chest pain and leg swelling.  Gastrointestinal:  Negative for diarrhea, nausea and vomiting.  Endocrine: Negative for cold intolerance and heat intolerance.  Musculoskeletal:  Negative for gait problem, myalgias, neck pain and neck stiffness.  Skin:  Negative for color change, rash and wound.  Allergic/Immunologic: Negative for food allergies.  Neurological:  Positive for headaches. Negative for dizziness, tremors, seizures, syncope, facial asymmetry, speech difficulty, weakness, light-headedness and numbness.  Hematological:  Negative for adenopathy. Does not bruise/bleed easily.  Psychiatric/Behavioral:  Negative for agitation, confusion and sleep disturbance.       Objective:   Physical Exam Vitals and nursing note reviewed.   Constitutional:      General: She is awake. She is not in acute distress.    Appearance: Normal appearance. She is well-developed, well-groomed and overweight. She is not ill-appearing, toxic-appearing or diaphoretic.  HENT:     Head: Normocephalic and atraumatic.     Jaw: There is normal jaw occlusion.     Salivary Glands: Right salivary gland is not diffusely enlarged or tender. Left salivary gland is not diffusely enlarged or tender.     Right Ear: Hearing and external ear normal.     Left Ear: Hearing and external ear normal.     Nose: Nose normal. No congestion or rhinorrhea.     Mouth/Throat:     Lips: Pink. No lesions.     Mouth: Mucous membranes are moist. No oral lesions or angioedema.     Dentition: No gum lesions.     Pharynx: Oropharynx is clear.  Eyes:     General: Lids are normal. Vision grossly intact. Gaze aligned appropriately. Allergic shiner present. No scleral icterus.       Right eye: No discharge.        Left eye: No discharge.     Extraocular Movements: Extraocular movements intact.     Conjunctiva/sclera: Conjunctivae normal.     Pupils: Pupils are equal, round, and reactive to light.  Neck:     Trachea: Trachea and phonation normal. No tracheal deviation.  Cardiovascular:     Rate and Rhythm: Normal rate and regular rhythm.     Pulses:          Radial pulses are 2+ on the right side and 2+ on the left side.  Pulmonary:     Effort: Pulmonary effort is normal.  Breath sounds: Normal breath sounds and air entry. No stridor or transmitted upper airway sounds. No decreased breath sounds or wheezing.     Comments: Spoke full sentences without difficulty; no cough observed in exam room Abdominal:     General: Abdomen is flat.  Musculoskeletal:        General: Normal range of motion.     Cervical back: Normal range of motion and neck supple. No swelling, edema, deformity, erythema, signs of trauma, lacerations, rigidity or crepitus. No pain with movement.  Normal range of motion.     Thoracic back: No swelling, edema, deformity, signs of trauma or lacerations. Normal range of motion.     Lumbar back: No swelling, edema, deformity, signs of trauma or lacerations. Normal range of motion.     Right ankle: No swelling.     Left ankle: No swelling.  Lymphadenopathy:     Head:     Right side of head: No submandibular or preauricular adenopathy.     Left side of head: No submandibular or preauricular adenopathy.     Cervical: No cervical adenopathy.     Right cervical: No superficial cervical adenopathy.    Left cervical: No superficial cervical adenopathy.  Skin:    General: Skin is warm and dry.     Capillary Refill: Capillary refill takes less than 2 seconds.     Coloration: Skin is not ashen, cyanotic, jaundiced, mottled, pale or sallow.     Findings: No abrasion, abscess, acne, bruising, burn, ecchymosis, erythema, signs of injury, laceration, lesion, petechiae, rash or wound.  Neurological:     General: No focal deficit present.     Mental Status: She is alert and oriented to person, place, and time. Mental status is at baseline.     GCS: GCS eye subscore is 4. GCS verbal subscore is 5. GCS motor subscore is 6.     Cranial Nerves: Cranial nerves 2-12 are intact. No cranial nerve deficit, dysarthria or facial asymmetry.     Sensory: Sensation is intact.     Motor: Motor function is intact. No weakness, tremor, atrophy, abnormal muscle tone or seizure activity.     Coordination: Coordination is intact. Coordination normal.     Gait: Gait is intact. Gait normal.     Comments: In/out of chair without difficulty; gait sure and steady in clinic; bilateral hand grasp equal 5/5  Psychiatric:        Attention and Perception: Attention and perception normal.        Mood and Affect: Mood and affect normal.        Speech: Speech normal.        Behavior: Behavior normal. Behavior is cooperative.        Thought Content: Thought content normal.         Cognition and Memory: Cognition and memory normal.        Judgment: Judgment normal.    Latest Reference Range & Units 11/03/21 12:27  Bilirubin, UA negative  negative  Clarity, UA clear  hazy !  Color, UA yellow  straw !  Glucose negative mg/dL negative  Ketones, UA negative mg/dL negative  Leukocytes,UA Negative  Trace !  Nitrite, UA Negative  Positive !  pH, UA 5.0 - 8.0  7.0  Protein,UA negative mg/dL negative  Specific Gravity, UA 1.010 - 1.025  1.015  Urobilinogen, UA 0.2 or 1.0 E.U./dL 0.2  RBC, UA negative  negative  !: Data is abnormal  POCT glucose today 229  Latest Reference Range & Units Most Recent  COMPREHENSIVE METABOLIC PANEL  Rpt ! 0000000 16:27  Sodium 134 - 144 mmol/L 142 08/11/20 16:27  Potassium 3.5 - 5.2 mmol/L 4.6 08/11/20 16:27  Chloride 96 - 106 mmol/L 103 08/11/20 16:27  CO2 20 - 29 mmol/L 24 08/11/20 16:27  Glucose 65 - 99 mg/dL 95 08/11/20 16:27  BUN 8 - 27 mg/dL 22 08/11/20 16:27  Creatinine 0.57 - 1.00 mg/dL 0.67 08/11/20 16:27  Calcium 8.7 - 10.3 mg/dL 9.7 08/11/20 16:27  Anion gap 5 - 15  9 08/10/17 00:24  BUN/Creatinine Ratio 12 - 28  33 (H) 08/11/20 16:27  Phosphorus 3.0 - 4.3 mg/dL 4.2 01/20/20 15:30  Alkaline Phosphatase 44 - 121 IU/L 96 08/11/20 16:27  Albumin 3.8 - 4.9 g/dL 5.1 (H) 08/11/20 16:27  Albumin/Globulin Ratio 1.2 - 2.2  2.3 (H) 08/11/20 16:27  Uric Acid 3.0 - 7.2 mg/dL 4.0 01/20/20 15:30  Lipase 0 - 75 U/L 32 01/07/12 12:27  AST 0 - 40 IU/L 29 08/11/20 16:27  ALT 0 - 32 IU/L 28 08/11/20 16:27  Total Protein 6.0 - 8.5 g/dL 7.3 08/11/20 16:27  Total Bilirubin 0.0 - 1.2 mg/dL 0.4 08/11/20 16:27  GGT 0 - 60 IU/L 19 01/20/20 15:30  GFR, Est Non African American >59 mL/min/1.73 96 08/11/20 16:27  GFR, Est African American >59 mL/min/1.73 110 08/11/20 16:27  Estimated CHD Risk 0.0 - 1.0 times avg.  < 0.5 01/20/20 15:30  LDH 119 - 226 IU/L 184 01/20/20 15:30  Total CHOL/HDL Ratio 0.0 - 4.4 ratio 3.1 08/11/20 16:27   Cholesterol, Total 100 - 199 mg/dL 166 08/11/20 16:27  HDL Cholesterol >39 mg/dL 53 08/11/20 16:27  LDL (calc) 0 - 99 mg/dL 113 (H) 12/08/17 09:14  MICROALB/CREAT RATIO 0 - 29 mg/g creat 9 08/11/20 16:09  Triglycerides 0 - 149 mg/dL 144 08/11/20 16:27  VLDL Cholesterol Cal 5 - 40 mg/dL 25 08/11/20 16:27  LDL Chol Calc (NIH) 0 - 99 mg/dL 88 08/11/20 16:27  Iron 27 - 159 ug/dL 100 01/20/20 15:30  Lactic Acid, Venous 0.5 - 1.9 mmol/L 1.36 11/06/16 20:28  Vitamin D, 25-Hydroxy 30.0 - 100.0 ng/mL 37.2 01/20/20 15:30  Vitamin B12 232 - 1,245 pg/mL 400 09/30/19 10:10  Globulin, Total 1.5 - 4.5 g/dL 2.2 08/11/20 16:27  !: Data is abnormal (H): Data is abnormally high Rpt: View report in Results Review for more information     Assessment & Plan:   A-UTI, vitamin D deficiency, hyperglycemia, hyperlipidemia associated with type 2 diabetes  P-patient verbally notified cloudy/sediment/positive leukocytes/nitrites/hazy dipstick urinalysis consistent with UTI.  Medications as directed. Penicillin and ciprofloxacin allergy bactrim DS po BID X 3 days #40 RF0 dispensed from [facility] to patient.  Previously taken without difficulty.  Patient is also to push fluids and may use Pyridium 200mg  po TID as needed. Hydrate, avoid dehydration. Avoid holding urine void on frequent basis every 4 to 6 hours. If unable to void every 8 hours follow up for re-evaluation with PCM, urgent care or ER. Call or return to clinic as needed if these symptoms worsen or fail to improve as anticipated. Exitcare handout on UTI printed and given to patient.  Urine culture sent to [facility] will call with results once available typically 72 hours.  Patient verbalized agreement and understanding of treatment plan and had no further questions at this time.  P2: Hydrate and cranberry juice    History of vitamin D deficiency.  Patient currently not taking any supplements will  check level today as annual labs being drawn.  See RN Hildred Alamin tomorrow  for lab results or I will call to discuss results with you via telephone.  Patient verbalized understanding information/instructions, agreed with plan of care and had no further questions at this time.  POCT glucose today 229 patient notified elevated ensure she drinks water/avoid dehydration/avoid added sugars in diet greater than 25 grams per day.  Take metformin as prescribed every day.  Recommended to eat lean proteins non starchy vegetables each meal.  Hgab1c drawn today as last on file 2021 13.7-6.7  Discussed infection can increase blood sugars.  May seen RN [facility] tomorrow for blood test results.  Patient verbalized understanding information/instructions, agreed with plan of care and had no further questions at this time.Executive panel/annual labs as diabetic type II, hyperlipidemia, hypertension.  See RN Hildred Alamin tomorrow for lab results or I will call to discuss results with you via telephone.  Patient verbalized understanding information/instructions, agreed with plan of care and had no further questions at this time.

## 2021-11-04 LAB — CMP12+LP+TP+TSH+6AC+CBC/D/PLT
ALT: 26 IU/L (ref 0–32)
AST: 30 IU/L (ref 0–40)
Albumin/Globulin Ratio: 2 (ref 1.2–2.2)
Albumin: 4.6 g/dL (ref 3.8–4.8)
Alkaline Phosphatase: 120 IU/L (ref 44–121)
BUN/Creatinine Ratio: 21 (ref 12–28)
BUN: 14 mg/dL (ref 8–27)
Basophils Absolute: 0.1 10*3/uL (ref 0.0–0.2)
Basos: 1 %
Bilirubin Total: 0.4 mg/dL (ref 0.0–1.2)
Calcium: 9.7 mg/dL (ref 8.7–10.3)
Chloride: 104 mmol/L (ref 96–106)
Chol/HDL Ratio: 4.5 ratio — ABNORMAL HIGH (ref 0.0–4.4)
Cholesterol, Total: 209 mg/dL — ABNORMAL HIGH (ref 100–199)
Creatinine, Ser: 0.67 mg/dL (ref 0.57–1.00)
EOS (ABSOLUTE): 0.1 10*3/uL (ref 0.0–0.4)
Eos: 2 %
Estimated CHD Risk: 1.1 times avg. — ABNORMAL HIGH (ref 0.0–1.0)
Free Thyroxine Index: 2.2 (ref 1.2–4.9)
GGT: 20 IU/L (ref 0–60)
Globulin, Total: 2.3 g/dL (ref 1.5–4.5)
Glucose: 227 mg/dL — ABNORMAL HIGH (ref 70–99)
HDL: 46 mg/dL (ref >39)
Hematocrit: 42 % (ref 34.0–46.6)
Hemoglobin: 14 g/dL (ref 11.1–15.9)
Immature Grans (Abs): 0 10*3/uL (ref 0.0–0.1)
Immature Granulocytes: 0 %
Iron: 71 ug/dL (ref 27–139)
LDH: 193 IU/L (ref 119–226)
LDL Chol Calc (NIH): 135 mg/dL — ABNORMAL HIGH (ref 0–99)
Lymphocytes Absolute: 3.1 10*3/uL (ref 0.7–3.1)
Lymphs: 41 %
MCH: 28.7 pg (ref 26.6–33.0)
MCHC: 33.3 g/dL (ref 31.5–35.7)
MCV: 86 fL (ref 79–97)
Monocytes Absolute: 0.5 10*3/uL (ref 0.1–0.9)
Monocytes: 6 %
Neutrophils Absolute: 3.8 10*3/uL (ref 1.4–7.0)
Neutrophils: 50 %
Phosphorus: 3.2 mg/dL (ref 3.0–4.3)
Platelets: 190 10*3/uL (ref 150–450)
Potassium: 4.1 mmol/L (ref 3.5–5.2)
RBC: 4.87 x10E6/uL (ref 3.77–5.28)
RDW: 12.4 % (ref 11.7–15.4)
Sodium: 139 mmol/L (ref 134–144)
T3 Uptake Ratio: 25 % (ref 24–39)
T4, Total: 8.6 ug/dL (ref 4.5–12.0)
TSH: 1.4 u[IU]/mL (ref 0.450–4.500)
Total Protein: 6.9 g/dL (ref 6.0–8.5)
Triglycerides: 154 mg/dL — ABNORMAL HIGH (ref 0–149)
Uric Acid: 3.2 mg/dL (ref 3.0–7.2)
VLDL Cholesterol Cal: 28 mg/dL (ref 5–40)
WBC: 7.6 10*3/uL (ref 3.4–10.8)
eGFR: 99 mL/min/{1.73_m2} (ref >59)

## 2021-11-04 LAB — MICROALBUMIN / CREATININE URINE RATIO
Creatinine, Urine: 157.1 mg/dL
Microalb/Creat Ratio: 10 mg/g creat (ref 0–29)
Microalbumin, Urine: 15.9 ug/mL

## 2021-11-04 LAB — HGB A1C W/O EAG: Hgb A1c MFr Bld: 7.6 % — ABNORMAL HIGH (ref 4.8–5.6)

## 2021-11-04 LAB — VITAMIN D 25 HYDROXY (VIT D DEFICIENCY, FRACTURES): Vit D, 25-Hydroxy: 12.8 ng/mL — ABNORMAL LOW (ref 30.0–100.0)

## 2021-11-04 NOTE — Progress Notes (Signed)
Results reviewed with pt. F/u with pcp re: lipids and A1c. Encouraged hydration. Pt taking Metformin 1000mg  BID for DM2 as prescribed. Preferred pharmacy for Vit D Rx [facility] . Appt made for A1c and Vit D recheck 3 months out, 02/02/22. Pt advises [place] at [place] but once plans are booked will reach out to reschedule. Likely back in June. Pt denies HA or any sx today. Feels well. Taking abx as Rx'd for UTI. Denies any current questions or concerns.

## 2021-11-05 ENCOUNTER — Encounter: Payer: Self-pay | Admitting: Registered Nurse

## 2021-11-05 ENCOUNTER — Telehealth: Payer: Self-pay | Admitting: Registered Nurse

## 2021-11-05 DIAGNOSIS — E559 Vitamin D deficiency, unspecified: Secondary | ICD-10-CM

## 2021-11-05 MED ORDER — CHOLECALCIFEROL 1.25 MG (50000 UT) PO TABS: 1.0000 | ORAL_TABLET | ORAL | 0 refills | Status: DC

## 2021-11-05 NOTE — Telephone Encounter (Signed)
See results note repeat level in 3 months.  Hgba1c repeat in 3 months.  See RN Rolly Salter in 1 week for weight and BP recheck as had UTI at office visit.  Start cholecalciferol 50,000 units by mouth weekly #17 RF0 with fattiest meal of the day to improve absorption.

## 2021-11-05 NOTE — Addendum Note (Signed)
Addended by: Albina Billet A on: 11/05/2021 04:25 PM   Modules accepted: Orders

## 2021-11-06 NOTE — Telephone Encounter (Signed)
Spoke with patient via telephone.  She stated urine clear and lighter in color now denied headache.  Today day 3 of bactrim DS po BID didn't start use until Friday.  Urine culture results still pending.  Patient has been taking OTC Vitamin D hasn't picked up new Rx yet.  Reminded patient it is dosed once a week.  She will stop to pick it up after work tomorrow as she hasn't received message from the pharmacy yet that it is ready.  Patient verbalized understanding information/instructions, agreed with plan of care and had no further questions at this time.

## 2021-11-08 LAB — URINE CULTURE

## 2021-11-10 ENCOUNTER — Ambulatory Visit: Payer: Self-pay | Admitting: *Deleted

## 2021-11-10 VITALS — BP 127/76 | HR 92 | Ht 64.0 in | Wt 178.0 lb

## 2021-11-10 DIAGNOSIS — I10 Essential (primary) hypertension: Secondary | ICD-10-CM

## 2021-11-10 NOTE — Progress Notes (Signed)
BP and Wt checks. Feels well. BP improved from last week. ?

## 2021-11-17 NOTE — Telephone Encounter (Signed)
2 Result Notes ?   ?Component 2 wk ago  ?Urine Culture, Routine Final report Abnormal    ?Organism ID, Bacteria Escherichia coli Abnormal    ?Comment: Cefazolin <=4 ug/mL  ?Cefazolin with an MIC <=16 predicts susceptibility to the oral agents  ?cefaclor, cefdinir, cefpodoxime, cefprozil, cefuroxime, cephalexin,  ?and loracarbef when used for therapy of uncomplicated urinary tract  ?infections due to E. coli, Klebsiella pneumoniae, and Proteus  ?mirabilis.  ?Greater than 100,000 colony forming units per mL   ?Antimicrobial Susceptibility Comment   ?Comment:       ** S = Susceptible; I = Intermediate; R = Resistant **  ?                   P = Positive; N = Negative  ?            MICS are expressed in micrograms per mL  ?   Antibiotic                 RSLT#1    RSLT#2    RSLT#3    RSLT#4  ?Amoxicillin/Clavulanic Acid    S  ?Ampicillin                     S  ?Cefepime                       S  ?Ceftriaxone                    S  ?Cefuroxime                     S  ?Ciprofloxacin                  S  ?Ertapenem                      S  ?Gentamicin                     S  ?Imipenem                       S  ?Levofloxacin                   S  ?Meropenem                      S  ?Nitrofurantoin                 S  ?Piperacillin/Tazobactam        S  ?Tetracycline                   S  ?Tobramycin                     S  ?Trimethoprim/Sulfa             S   ?Resulting Agency [facility]  ?  ? ?  ?Narrative ?Performed by: Verdell Carmine ?Performed at:  01 - [facility]  ?[address], [place], [place]  [id number]  ?Lab Director: Jolene Schimke MD, Phone:  [phone]  ?  ?Specimen Collected: 11/03/21 11:50 Last Resulted: 11/08/21 16:36  ?  ?  ?Patient notified of results 11/08/21 verbally see results note.  Patient seen in warehouse again today feeling well denied concerns.  A&Ox3 skin warm dry and pink gait sure and steady.  Respirations even and unlabored RA. ?

## 2021-11-24 ENCOUNTER — Encounter: Payer: Self-pay | Admitting: Registered Nurse

## 2021-11-24 ENCOUNTER — Other Ambulatory Visit: Payer: Self-pay

## 2021-11-24 ENCOUNTER — Telehealth: Payer: Self-pay | Admitting: Registered Nurse

## 2021-11-24 ENCOUNTER — Emergency Department (HOSPITAL_COMMUNITY): Payer: No Typology Code available for payment source

## 2021-11-24 ENCOUNTER — Encounter (HOSPITAL_COMMUNITY): Payer: Self-pay | Admitting: Emergency Medicine

## 2021-11-24 ENCOUNTER — Inpatient Hospital Stay (HOSPITAL_COMMUNITY)
Admission: EM | Admit: 2021-11-24 | Discharge: 2021-11-26 | DRG: 690 | Disposition: A | Discharge status: Home / Self Care | Payer: No Typology Code available for payment source | Attending: Family Medicine | Admitting: Family Medicine

## 2021-11-24 DIAGNOSIS — E785 Hyperlipidemia, unspecified: Secondary | ICD-10-CM | POA: Diagnosis present

## 2021-11-24 DIAGNOSIS — N3000 Acute cystitis without hematuria: Secondary | ICD-10-CM

## 2021-11-24 DIAGNOSIS — F17210 Nicotine dependence, cigarettes, uncomplicated: Secondary | ICD-10-CM | POA: Diagnosis present

## 2021-11-24 DIAGNOSIS — N12 Tubulo-interstitial nephritis, not specified as acute or chronic: Principal | ICD-10-CM | POA: Diagnosis present

## 2021-11-24 DIAGNOSIS — N390 Urinary tract infection, site not specified: Secondary | ICD-10-CM | POA: Diagnosis present

## 2021-11-24 DIAGNOSIS — E1165 Type 2 diabetes mellitus with hyperglycemia: Secondary | ICD-10-CM | POA: Diagnosis present

## 2021-11-24 DIAGNOSIS — K760 Fatty (change of) liver, not elsewhere classified: Secondary | ICD-10-CM | POA: Diagnosis present

## 2021-11-24 DIAGNOSIS — R000 Tachycardia, unspecified: Secondary | ICD-10-CM | POA: Diagnosis present

## 2021-11-24 DIAGNOSIS — E876 Hypokalemia: Secondary | ICD-10-CM | POA: Diagnosis present

## 2021-11-24 DIAGNOSIS — J449 Chronic obstructive pulmonary disease, unspecified: Secondary | ICD-10-CM | POA: Diagnosis present

## 2021-11-24 DIAGNOSIS — Z881 Allergy status to other antibiotic agents status: Secondary | ICD-10-CM

## 2021-11-24 DIAGNOSIS — Z79899 Other long term (current) drug therapy: Secondary | ICD-10-CM

## 2021-11-24 DIAGNOSIS — E860 Dehydration: Secondary | ICD-10-CM | POA: Diagnosis present

## 2021-11-24 DIAGNOSIS — I10 Essential (primary) hypertension: Secondary | ICD-10-CM | POA: Diagnosis present

## 2021-11-24 DIAGNOSIS — R7401 Elevation of levels of liver transaminase levels: Secondary | ICD-10-CM

## 2021-11-24 DIAGNOSIS — R7989 Other specified abnormal findings of blood chemistry: Secondary | ICD-10-CM | POA: Diagnosis present

## 2021-11-24 DIAGNOSIS — Z7984 Long term (current) use of oral hypoglycemic drugs: Secondary | ICD-10-CM

## 2021-11-24 DIAGNOSIS — Z20822 Contact with and (suspected) exposure to covid-19: Secondary | ICD-10-CM | POA: Diagnosis present

## 2021-11-24 DIAGNOSIS — Z880 Allergy status to penicillin: Secondary | ICD-10-CM

## 2021-11-24 LAB — CBC WITH DIFFERENTIAL/PLATELET
Abs Immature Granulocytes: 0.08 10*3/uL — ABNORMAL HIGH (ref 0.00–0.07)
Basophils Absolute: 0 10*3/uL (ref 0.0–0.1)
Basophils Relative: 0 %
Eosinophils Absolute: 0 10*3/uL (ref 0.0–0.5)
Eosinophils Relative: 0 %
HCT: 42.6 % (ref 36.0–46.0)
Hemoglobin: 14 g/dL (ref 12.0–15.0)
Immature Granulocytes: 1 %
Lymphocytes Relative: 7 %
Lymphs Abs: 1 10*3/uL (ref 0.7–4.0)
MCH: 29.4 pg (ref 26.0–34.0)
MCHC: 32.9 g/dL (ref 30.0–36.0)
MCV: 89.3 fL (ref 80.0–100.0)
Monocytes Absolute: 1.1 10*3/uL — ABNORMAL HIGH (ref 0.1–1.0)
Monocytes Relative: 8 %
Neutro Abs: 12 10*3/uL — ABNORMAL HIGH (ref 1.7–7.7)
Neutrophils Relative %: 84 %
Platelets: 151 10*3/uL (ref 150–400)
RBC: 4.77 MIL/uL (ref 3.87–5.11)
RDW: 13.5 % (ref 11.5–15.5)
WBC: 14.2 10*3/uL — ABNORMAL HIGH (ref 4.0–10.5)
nRBC: 0 % (ref 0.0–0.2)

## 2021-11-24 LAB — COMPREHENSIVE METABOLIC PANEL
ALT: 82 U/L — ABNORMAL HIGH (ref 0–44)
AST: 69 U/L — ABNORMAL HIGH (ref 15–41)
Albumin: 4.2 g/dL (ref 3.5–5.0)
Alkaline Phosphatase: 106 U/L (ref 38–126)
Anion gap: 13 (ref 5–15)
BUN: 32 mg/dL — ABNORMAL HIGH (ref 8–23)
CO2: 22 mmol/L (ref 22–32)
Calcium: 8.9 mg/dL (ref 8.9–10.3)
Chloride: 97 mmol/L — ABNORMAL LOW (ref 98–111)
Creatinine, Ser: 1 mg/dL (ref 0.44–1.00)
GFR, Estimated: >60 mL/min (ref >60)
Glucose, Bld: 227 mg/dL — ABNORMAL HIGH (ref 70–99)
Potassium: 3.5 mmol/L (ref 3.5–5.1)
Sodium: 132 mmol/L — ABNORMAL LOW (ref 135–145)
Total Bilirubin: 1.6 mg/dL — ABNORMAL HIGH (ref 0.3–1.2)
Total Protein: 8.1 g/dL (ref 6.5–8.1)

## 2021-11-24 LAB — URINALYSIS, ROUTINE W REFLEX MICROSCOPIC
Bacteria, UA: NONE SEEN
Bilirubin Urine: NEGATIVE
Glucose, UA: NEGATIVE mg/dL
Ketones, ur: NEGATIVE mg/dL
Nitrite: NEGATIVE
Protein, ur: 100 mg/dL — AB
Specific Gravity, Urine: 1.015 (ref 1.005–1.030)
WBC, UA: >50 WBC/hpf — ABNORMAL HIGH (ref 0–5)
pH: 5 (ref 5.0–8.0)

## 2021-11-24 LAB — CBC
HCT: 37 % (ref 36.0–46.0)
Hemoglobin: 12.4 g/dL (ref 12.0–15.0)
MCH: 29.3 pg (ref 26.0–34.0)
MCHC: 33.5 g/dL (ref 30.0–36.0)
MCV: 87.5 fL (ref 80.0–100.0)
Platelets: 133 10*3/uL — ABNORMAL LOW (ref 150–400)
RBC: 4.23 MIL/uL (ref 3.87–5.11)
RDW: 13.5 % (ref 11.5–15.5)
WBC: 11.4 10*3/uL — ABNORMAL HIGH (ref 4.0–10.5)
nRBC: 0 % (ref 0.0–0.2)

## 2021-11-24 LAB — RESP PANEL BY RT-PCR (FLU A&B, COVID) ARPGX2
Influenza A by PCR: NEGATIVE
Influenza B by PCR: NEGATIVE
SARS Coronavirus 2 by RT PCR: NEGATIVE

## 2021-11-24 LAB — LIPASE, BLOOD: Lipase: 23 U/L (ref 11–51)

## 2021-11-24 LAB — CREATININE, SERUM
Creatinine, Ser: 0.88 mg/dL (ref 0.44–1.00)
GFR, Estimated: >60 mL/min (ref >60)

## 2021-11-24 LAB — GLUCOSE, CAPILLARY: Glucose-Capillary: 142 mg/dL — ABNORMAL HIGH (ref 70–99)

## 2021-11-24 MED ORDER — ONDANSETRON HCL 4 MG/2ML IJ SOLN
4.0000 mg | Freq: Once | INTRAMUSCULAR | Status: AC
  Administered 2021-11-24: 4 mg via INTRAVENOUS
  Filled 2021-11-24: qty 2

## 2021-11-24 MED ORDER — SODIUM CHLORIDE 0.9 % IV SOLN: INTRAVENOUS | Status: DC

## 2021-11-24 MED ORDER — HYDROMORPHONE HCL 1 MG/ML IJ SOLN
1.0000 mg | Freq: Once | INTRAMUSCULAR | Status: AC
  Administered 2021-11-24: 1 mg via INTRAVENOUS
  Filled 2021-11-24: qty 1

## 2021-11-24 MED ORDER — SODIUM CHLORIDE 0.9 % IV SOLN
1.0000 g | Freq: Once | INTRAVENOUS | Status: AC
  Administered 2021-11-24: 1 g via INTRAVENOUS
  Filled 2021-11-24: qty 10

## 2021-11-24 MED ORDER — SODIUM CHLORIDE 0.9 % IV SOLN
1.0000 g | INTRAVENOUS | Status: DC
  Administered 2021-11-25 – 2021-11-26 (×2): 1 g via INTRAVENOUS
  Filled 2021-11-24 (×2): qty 10

## 2021-11-24 MED ORDER — ACETAMINOPHEN 325 MG PO TABS
650.0000 mg | ORAL_TABLET | Freq: Four times a day (QID) | ORAL | Status: DC | PRN
  Administered 2021-11-24 – 2021-11-26 (×5): 650 mg via ORAL
  Filled 2021-11-24 (×5): qty 2

## 2021-11-24 MED ORDER — FENTANYL CITRATE PF 50 MCG/ML IJ SOSY
100.0000 ug | PREFILLED_SYRINGE | Freq: Once | INTRAMUSCULAR | Status: AC
  Administered 2021-11-24: 100 ug via INTRAVENOUS
  Filled 2021-11-24: qty 2

## 2021-11-24 MED ORDER — SODIUM CHLORIDE 0.9 % IV BOLUS
1000.0000 mL | Freq: Once | INTRAVENOUS | Status: AC
  Administered 2021-11-24: 1000 mL via INTRAVENOUS

## 2021-11-24 MED ORDER — ACETAMINOPHEN 650 MG RE SUPP: 650.0000 mg | Freq: Four times a day (QID) | RECTAL | Status: DC | PRN

## 2021-11-24 MED ORDER — INSULIN ASPART 100 UNIT/ML IJ SOLN: 0.0000 [IU] | Freq: Three times a day (TID) | INTRAMUSCULAR | Status: DC

## 2021-11-24 MED ORDER — ENOXAPARIN SODIUM 40 MG/0.4ML IJ SOSY
40.0000 mg | PREFILLED_SYRINGE | Freq: Every day | INTRAMUSCULAR | Status: DC
  Administered 2021-11-24 – 2021-11-25 (×2): 40 mg via SUBCUTANEOUS
  Filled 2021-11-24 (×2): qty 0.4

## 2021-11-24 NOTE — ED Notes (Signed)
Per Dr Manson Passey at [facility]-sending patient over for hypotension, Tachardia-right flank pain ?

## 2021-11-24 NOTE — Progress Notes (Signed)
?   11/24/21 2019  ?Assess: MEWS Score  ?Temp (!) 103.1 ?F (39.5 ?C)  ?BP 137/70  ?Pulse Rate (!) 115  ?Resp 18  ?SpO2 98 %  ?O2 Device Room Air  ?Assess: MEWS Score  ?MEWS Temp 2  ?MEWS Systolic 0  ?MEWS Pulse 2  ?MEWS RR 0  ?MEWS LOC 0  ?MEWS Score 4  ?MEWS Score Color Red  ?Assess: if the MEWS score is Yellow or Red  ?Were vital signs taken at a resting state? Yes  ?Focused Assessment No change from prior assessment  ?Does the patient meet 2 or more of the SIRS criteria? No  ?Does the patient have a confirmed or suspected source of infection? Yes  ?Provider and Rapid Response Notified? Yes  ?MEWS guidelines implemented *See Row Information* Yes  ?Treat  ?MEWS Interventions Administered prn meds/treatments;Administered scheduled meds/treatments  ?Complains of Fever  ?Assess: SIRS CRITERIA  ?SIRS Temperature  1  ?SIRS Pulse 1  ?SIRS Respirations  0  ?SIRS WBC 0  ?SIRS Score Sum  2  ? ?Pt on Red MEWs d/t increased temperature of 103.1 and increased heart rate at 115. Pts BP and O2 stable, no complaints of pain or distress. PRN tylenol administered and fluids initiated. Pt resting with no other complaints - plan of care continues.  ?

## 2021-11-24 NOTE — H&P (Signed)
TRH H&P    Patient Demographics:    Robin Chen, is a 61 y.o. female  MRN: [id number]  DOB - 1961-02-19  Admit Date - 11/24/2021    Outpatient Primary MD for the patient is Janeece Agee, NP  Patient coming from:   Chief complaint-flank pain   HPI:    Robin Chen  is a 61 y.o. female, with history of hyperlipidemia, diabetes mellitus type 2, hypertension came to hospital with chief complaint of poor p.o. intake vomiting, dysuria and right flank pain for past 5 days.  Patient said that she started having dysuria, foul-smelling urine and increased frequency of urination.  She was also vomiting and was only able to drink water.  Denies abdominal pain.  She was started on Bactrim yesterday but did not take it today due to vomiting.  She complains of chills but no fever at home. Denies chest pain or shortness of breath Denies coughing Denies diarrhea, denies nausea Denies hematuria    Review of systems:    In addition to the HPI above,    All other systems reviewed and are negative.    Past History of the following :    Past Medical History:  Diagnosis Date   Anemia    Blood transfusion    2003   Chronic kidney disease    Rt UPJ stone   COPD (chronic obstructive pulmonary disease) (HCC)    Diabetes mellitus without complication (HCC)    Headache(784.0)    Hyperlipidemia    Hypertension       Past Surgical History:  Procedure Laterality Date   CYSTOSCOPY W/ URETERAL STENT PLACEMENT     DILATION AND CURETTAGE OF UTERUS     2003      Social History:      Social History   Tobacco Use   Smoking status: Every Day    Packs/day: 1.00    Years: 30.00    Pack years: 30.00    Types: Cigarettes   Smokeless tobacco: Never  Substance Use Topics   Alcohol use: No       Family History :     Family History  Problem Relation Age of Onset   Cancer Mother    Colon cancer Neg Hx     Colon polyps Neg Hx    Esophageal cancer Neg Hx    Stomach cancer Neg Hx    Rectal cancer Neg Hx    Breast cancer Neg Hx       Home Medications:   Prior to Admission medications   Medication Sig Start Date End Date Taking? Authorizing Provider  Cholecalciferol (VITAMIN D3) 1.25 MG (50000 UT) CAPS Take 1 capsule by mouth every Saturday. 11/12/21  Yes [provider]  metFORMIN (GLUCOPHAGE) 1000 MG tablet Take 1 tablet (1,000 mg total) by mouth 2 (two) times daily with a meal. 02/04/20 11/24/21 Yes Janeece Agee, NP     Allergies:     Allergies  Allergen Reactions   Penicillins Rash and Other (See Comments)    Has patient had a PCN  reaction causing immediate rash, facial/tongue/throat swelling, SOB or lightheadedness with hypotension: No Has patient had a PCN reaction causing severe rash involving mucus membranes or skin necrosis: No Has patient had a PCN reaction that required hospitalization No Has patient had a PCN reaction occurring within the last 10 years: No If all of the above answers are "NO", then may proceed with Cephalosporin use.   Ciprofloxacin Rash     Physical Exam:   Vitals  Blood pressure 123/69, pulse (!) 121, temperature 98.4 F (36.9 C), temperature source Oral, resp. rate 16, SpO2 93 %.  1.  General: Appears in no acute distress  2. Psychiatric: Alert, oriented x3, intact insight and judgment  3. Neurologic: Cranial nerves II through XII grossly intact, no focal deficit noted  4. HEENMT:  Atraumatic normocephalic, extraocular muscles are intact  5. Respiratory : Clear to auscultation bilaterally  6. Cardiovascular : S1-S2, regular, no murmur auscultated  7. Gastrointestinal:  Abdomen is soft, nontender, no organomegaly, right CVA tenderness  8. Skin:  No rashes      Data Review:    CBC Recent Labs  Lab 11/24/21 1541  WBC 14.2*  HGB 14.0  HCT 42.6  PLT 151  MCV 89.3  MCH 29.4  MCHC 32.9  RDW 13.5  LYMPHSABS 1.0   MONOABS 1.1*  EOSABS 0.0  BASOSABS 0.0   ------------------------------------------------------------------------------------------------------------------  Results for orders placed or performed during the hospital encounter of 11/24/21 (from the past 48 hour(s))  Comprehensive metabolic panel     Status: Abnormal   Collection Time: 11/24/21  3:41 PM  Result Value Ref Range   Sodium 132 (L) 135 - 145 mmol/L   Potassium 3.5 3.5 - 5.1 mmol/L   Chloride 97 (L) 98 - 111 mmol/L   CO2 22 22 - 32 mmol/L   Glucose, Bld 227 (H) 70 - 99 mg/dL    Comment: Glucose reference range applies only to samples taken after fasting for at least 8 hours.   BUN 32 (H) 8 - 23 mg/dL   Creatinine, Ser 6.57 0.44 - 1.00 mg/dL   Calcium 8.9 8.9 - 84.6 mg/dL   Total Protein 8.1 6.5 - 8.1 g/dL   Albumin 4.2 3.5 - 5.0 g/dL   AST 69 (H) 15 - 41 U/L   ALT 82 (H) 0 - 44 U/L   Alkaline Phosphatase 106 38 - 126 U/L   Total Bilirubin 1.6 (H) 0.3 - 1.2 mg/dL   GFR, Estimated >96 >29 mL/min    Comment: (NOTE) Calculated using the CKD-EPI Creatinine Equation (2021)    Anion gap 13 5 - 15    Comment: Performed at [facility], [address] [place], [place] [address]  Urinalysis, Routine w reflex microscopic Urine, Clean Catch     Status: Abnormal   Collection Time: 11/24/21  3:41 PM  Result Value Ref Range   Color, Urine AMBER (A) YELLOW    Comment: BIOCHEMICALS MAY BE AFFECTED BY COLOR   APPearance HAZY (A) CLEAR   Specific Gravity, Urine 1.015 1.005 - 1.030   pH 5.0 5.0 - 8.0   Glucose, UA NEGATIVE NEGATIVE mg/dL   Hgb urine dipstick MODERATE (A) NEGATIVE   Bilirubin Urine NEGATIVE NEGATIVE   Ketones, ur NEGATIVE NEGATIVE mg/dL   Protein, ur 324 (A) NEGATIVE mg/dL   Nitrite NEGATIVE NEGATIVE   Leukocytes,Ua LARGE (A) NEGATIVE   RBC / HPF 6-10 0 - 5 RBC/hpf   WBC, UA >50 (H) 0 - 5 WBC/hpf  Bacteria, UA NONE SEEN NONE SEEN   Squamous Epithelial / LPF 0-5 0 - 5   WBC Clumps PRESENT     Mucus PRESENT    Hyaline Casts, UA PRESENT    Non Squamous Epithelial 0-5 (A) NONE SEEN    Comment: Performed at [facility], [address] [place], [place] [address]  CBC with Differential     Status: Abnormal   Collection Time: 11/24/21  3:41 PM  Result Value Ref Range   WBC 14.2 (H) 4.0 - 10.5 K/uL   RBC 4.77 3.87 - 5.11 MIL/uL   Hemoglobin 14.0 12.0 - 15.0 g/dL   HCT 95.2 84.1 - 32.4 %   MCV 89.3 80.0 - 100.0 fL   MCH 29.4 26.0 - 34.0 pg   MCHC 32.9 30.0 - 36.0 g/dL   RDW 40.1 02.7 - 25.3 %   Platelets 151 150 - 400 K/uL   nRBC 0.0 0.0 - 0.2 %   Neutrophils Relative % 84 %   Neutro Abs 12.0 (H) 1.7 - 7.7 K/uL   Lymphocytes Relative 7 %   Lymphs Abs 1.0 0.7 - 4.0 K/uL   Monocytes Relative 8 %   Monocytes Absolute 1.1 (H) 0.1 - 1.0 K/uL   Eosinophils Relative 0 %   Eosinophils Absolute 0.0 0.0 - 0.5 K/uL   Basophils Relative 0 %   Basophils Absolute 0.0 0.0 - 0.1 K/uL   Immature Granulocytes 1 %   Abs Immature Granulocytes 0.08 (H) 0.00 - 0.07 K/uL    Comment: Performed at [facility], [address], [place], [place] [address]  Lipase, blood     Status: None   Collection Time: 11/24/21  3:41 PM  Result Value Ref Range   Lipase 23 11 - 51 U/L    Comment: Performed at [facility], [address] [place], [place] [address]    Chemistries  Recent Labs  Lab 11/24/21 1541  NA 132*  K 3.5  CL 97*  CO2 22  GLUCOSE 227*  BUN 32*  CREATININE 1.00  CALCIUM 8.9  AST 69*  ALT 82*  ALKPHOS 106  BILITOT 1.6*   ------------------------------------------------------------------------------------------------------------------  ------------------------------------------------------------------------------------------------------------------ GFR: CrCl cannot be calculated (Unknown ideal weight.). Liver Function Tests: Recent Labs  Lab 11/24/21 1541  AST 69*  ALT 82*  ALKPHOS 106  BILITOT 1.6*  PROT 8.1   ALBUMIN 4.2   Recent Labs  Lab 11/24/21 1541  LIPASE 23   No results for input(s): AMMONIA in the last 168 hours. Coagulation Profile: No results for input(s): INR, PROTIME in the last 168 hours. Cardiac Enzymes: No results for input(s): CKTOTAL, CKMB, CKMBINDEX, TROPONINI in the last 168 hours. BNP (last 3 results) No results for input(s): PROBNP in the last 8760 hours. HbA1C: No results for input(s): HGBA1C in the last 72 hours. CBG: No results for input(s): GLUCAP in the last 168 hours. Lipid Profile: No results for input(s): CHOL, HDL, LDLCALC, TRIG, CHOLHDL, LDLDIRECT in the last 72 hours. Thyroid Function Tests: No results for input(s): TSH, T4TOTAL, FREET4, T3FREE, THYROIDAB in the last 72 hours. Anemia Panel: No results for input(s): VITAMINB12, FOLATE, FERRITIN, TIBC, IRON, RETICCTPCT in the last 72 hours.  --------------------------------------------------------------------------------------------------------------- Urine analysis:    Component Value Date/Time   COLORURINE AMBER (A) 11/24/2021 1541   APPEARANCEUR HAZY (A) 11/24/2021 1541   LABSPEC 1.015 11/24/2021 1541   PHURINE 5.0 11/24/2021 1541   GLUCOSEU NEGATIVE 11/24/2021 1541   HGBUR MODERATE (A) 11/24/2021 1541  BILIRUBINUR NEGATIVE 11/24/2021 1541   BILIRUBINUR negative 11/03/2021 1227   BILIRUBINUR negative 08/09/2017 1220   KETONESUR NEGATIVE 11/24/2021 1541   PROTEINUR 100 (A) 11/24/2021 1541   UROBILINOGEN 0.2 11/03/2021 1227   NITRITE NEGATIVE 11/24/2021 1541   LEUKOCYTESUR LARGE (A) 11/24/2021 1541      Imaging Results:    CT Renal Stone Study  Result Date: 11/24/2021 CLINICAL DATA:  Dysuria. Muller S urine. Right flank pain. Weakness. EXAM: CT ABDOMEN AND PELVIS WITHOUT CONTRAST TECHNIQUE: Multidetector CT imaging of the abdomen and pelvis was performed following the standard protocol without IV contrast. RADIATION DOSE REDUCTION: This exam was performed according to the departmental  dose-optimization program which includes automated exposure control, adjustment of the mA and/or kV according to patient size and/or use of iterative reconstruction technique. COMPARISON:  08/10/2017 FINDINGS: Lower chest: Small type 1 hiatal hernia. Mild descending thoracic aortic atherosclerotic calcification. Hepatobiliary: Diffuse hepatic steatosis. Calcifications in segment 8 of the liver on image 13 series 2 unchanged from 11/06/2016, likely benign postinflammatory calcifications. Mildly contracted gallbladder. Pancreas: Unremarkable Spleen: Unremarkable Adrenals/Urinary Tract: Both adrenal glands appear normal. Right greater than left perirenal stranding along with edema tracking along the margins of the right perirenal fascia. Mild effacement of fat planes along the right renal hilum. No [place] in the kidney or collecting system. No hydronephrosis or urinary tract calculi identified. Stomach/Bowel: Unremarkable.  Normal appendix. Vascular/Lymphatic: Atherosclerosis is present, including aortoiliac atherosclerotic disease. Reproductive: Stable calcification along the right adnexa. Other: No supplemental non-categorized findings. Musculoskeletal: Lumbar degenerative disc disease causing suspected impingement at L3-4 and L4-5. IMPRESSION: 1. Asymmetric perirenal stranding on the right along with indistinctness of fat planes along the right renal hilum, suspicious for right renal inflammation. In this clinical context, the appearance raises concern for potential pyelonephritis. No Ellinger is identified in the right kidney or collecting system and no hydronephrosis is observed. 2. Other imaging findings of potential clinical significance: Small type 1 hiatal hernia. Aortic Atherosclerosis (ICD10-I70.0). Diffuse hepatic steatosis. Lumbar impingement at L3-4 and L4-5. Electronically Signed   By: Gaylyn Rong M.D.   On: 11/24/2021 16:26       Assessment & Plan:    Principal Problem:   UTI (urinary tract  infection)   UTI-patient has abnormal UA, presenting with dysuria, increased frequency of urination, foul-smelling urine.  Started on IV Rocephin.  CT renal stone study shows asymmetric perirenal stranding on the right along with indistinctness of fat Killian's along the right renal hilum suspicious for right renal inflammation.  Follow urine culture results Dehydration-mild-patient states that she has had poor p.o. intake for past 5 days, BUN is elevated to 32.  Start normal saline at 75 mill per hour.  Follow BMP in am Diabetes mellitus type 2-we will start sliding scale insulin with NovoLog.  Check hemoglobin A1c.  Hold metformin Sinus tachycardia-likely from poor p.o. intake, dehydration.  Started on IV fluids as above. History of hypertension-blood pressure is stable at this time.  Will await med reconciliation.   DVT Prophylaxis-   Lovenox   AM Labs Ordered, also please review Full Orders  Family Communication: Admission, patients condition and plan of care including tests being ordered have been discussed with the patient  who indicate understanding and agree with the plan and Code Status.  Code Status: Full code  Admission status: Observation  Time spent in minutes : 60 minutes   Daneshia Tavano S Shavon Zenz M.D

## 2021-11-24 NOTE — ED Provider Notes (Signed)
?[facility]-EMERGENCY DEPT ?Provider Note ? ? ?CSN: [id number] ?Arrival date & time: 11/24/21  1439 ? ?  ? ?History ? ?Chief Complaint  ?Patient presents with  ? Dysuria  ? Emesis  ? ? ?Robin Chen is a 61 y.o. female. ? ?The history is provided by a relative.  ?Dysuria ?Associated symptoms: vomiting   ?Emesis ? ?Patient is a 61 year old female presenting with dysuria and right flank pain x5 days.  Started with dysuria and foul-smelling urine, the pain started rating up her right flank.  This intermittent, worse when she goes from sitting to standing.  Denies any previous abdominal surgeries, endorses history of UTIs and kidney stones.  Was started on Bactrim yesterday, did not take any today.  She is having vomiting, denies any nausea.  No fevers or chills at home, denies any hematuria. ? ?Art therapist used in room.  ? ?Home Medications ?Prior to Admission medications   ?Medication Sig Start Date End Date Taking? Authorizing Provider  ?Cholecalciferol (VITAMIN D3) 1.25 MG (50000 UT) CAPS Take 1 capsule by mouth every Saturday. 11/12/21  Yes [provider]  ?metFORMIN (GLUCOPHAGE) 1000 MG tablet Take 1 tablet (1,000 mg total) by mouth 2 (two) times daily with a meal. 02/04/20 11/24/21 Yes Janeece Agee, NP  ?   ? ?Allergies    ?Penicillins and Ciprofloxacin   ? ?Review of Systems   ?Review of Systems  ?Gastrointestinal:  Positive for vomiting.  ?Genitourinary:  Positive for dysuria.  ? ?Physical Exam ?Updated Vital Signs ?BP 123/69 (BP Location: Left Arm)   Pulse (!) 121   Temp 98.4 ?F (36.9 ?C) (Oral)   Resp 16   SpO2 93%  ?Physical Exam ?Vitals and nursing note reviewed. Exam conducted with a chaperone present.  ?Constitutional:   ?   General: She is not in acute distress. ?   Appearance: Normal appearance.  ?HENT:  ?   Head: Normocephalic and atraumatic.  ?Eyes:  ?   General: No scleral icterus. ?   Extraocular Movements: Extraocular movements intact.  ?   Pupils: Pupils are  equal, round, and reactive to light.  ?Cardiovascular:  ?   Rate and Rhythm: Regular rhythm. Tachycardia present.  ?   Pulses: Normal pulses.  ?Pulmonary:  ?   Effort: Pulmonary effort is normal.  ?Abdominal:  ?   General: Abdomen is flat.  ?   Tenderness: There is no abdominal tenderness. There is right CVA tenderness.  ?Skin: ?   Coloration: Skin is not jaundiced.  ?Neurological:  ?   Mental Status: She is alert. Mental status is at baseline.  ?   Coordination: Coordination normal.  ? ? ?ED Results / Procedures / Treatments   ?Labs ?(all labs ordered are listed, but only abnormal results are displayed) ?Labs Reviewed  ?COMPREHENSIVE METABOLIC PANEL - Abnormal; Notable for the following components:  ?    Result Value  ? Sodium 132 (*)   ? Chloride 97 (*)   ? Glucose, Bld 227 (*)   ? BUN 32 (*)   ? AST 69 (*)   ? ALT 82 (*)   ? Total Bilirubin 1.6 (*)   ? All other components within normal limits  ?URINALYSIS, ROUTINE W REFLEX MICROSCOPIC - Abnormal; Notable for the following components:  ? Color, Urine AMBER (*)   ? APPearance HAZY (*)   ? Hgb urine dipstick MODERATE (*)   ? Protein, ur 100 (*)   ? Leukocytes,Ua LARGE (*)   ? WBC, UA >50 (*)   ?  Non Squamous Epithelial 0-5 (*)   ? All other components within normal limits  ?CBC WITH DIFFERENTIAL/PLATELET - Abnormal; Notable for the following components:  ? WBC 14.2 (*)   ? Neutro Abs 12.0 (*)   ? Monocytes Absolute 1.1 (*)   ? Abs Immature Granulocytes 0.08 (*)   ? All other components within normal limits  ?URINE CULTURE  ?RESP PANEL BY RT-PCR (FLU A&B, COVID) [id number]  ?LIPASE, BLOOD  ? ? ?EKG ?None ? ?Radiology ?CT Renal Stone Study ? ?Result Date: 11/24/2021 ?CLINICAL DATA:  Dysuria. Muller S urine. Right flank pain. Weakness. EXAM: CT ABDOMEN AND PELVIS WITHOUT CONTRAST TECHNIQUE: Multidetector CT imaging of the abdomen and pelvis was performed following the standard protocol without IV contrast. RADIATION DOSE REDUCTION: This exam was performed according to the  departmental dose-optimization program which includes automated exposure control, adjustment of the mA and/or kV according to patient size and/or use of iterative reconstruction technique. COMPARISON:  08/10/2017 FINDINGS: Lower chest: Small type 1 hiatal hernia. Mild descending thoracic aortic atherosclerotic calcification. Hepatobiliary: Diffuse hepatic steatosis. Calcifications in segment 8 of the liver on image 13 series 2 unchanged from 11/06/2016, likely benign postinflammatory calcifications. Mildly contracted gallbladder. Pancreas: Unremarkable Spleen: Unremarkable Adrenals/Urinary Tract: Both adrenal glands appear normal. Right greater than left perirenal stranding along with edema tracking along the margins of the right perirenal fascia. Mild effacement of fat planes along the right renal hilum. No Rothlisberger in the kidney or collecting system. No hydronephrosis or urinary tract calculi identified. Stomach/Bowel: Unremarkable.  Normal appendix. Vascular/Lymphatic: Atherosclerosis is present, including aortoiliac atherosclerotic disease. Reproductive: Stable calcification along the right adnexa. Other: No supplemental non-categorized findings. Musculoskeletal: Lumbar degenerative disc disease causing suspected impingement at L3-4 and L4-5. IMPRESSION: 1. Asymmetric perirenal stranding on the right along with indistinctness of fat planes along the right renal hilum, suspicious for right renal inflammation. In this clinical context, the appearance raises concern for potential pyelonephritis. No [place] is identified in the right kidney or collecting system and no hydronephrosis is observed. 2. Other imaging findings of potential clinical significance: Small type 1 hiatal hernia. Aortic Atherosclerosis (ICD10-I70.0). Diffuse hepatic steatosis. Lumbar impingement at L3-4 and L4-5. Electronically Signed   By: Gaylyn RongWalter  Liebkemann M.D.   On: 11/24/2021 16:26   ? ?Procedures ?Marland Kitchen.Critical Care ?Performed by: Theron AristaSage, Hodaya Curto,  PA-C ?Authorized by: Theron AristaSage, Kaz Auld, PA-C  ? ?Critical care provider statement:  ?  Critical care time (minutes):  30 ?  Critical care start time:  11/24/2021 4:30 PM ?  Critical care end time:  11/24/2021 5:00 PM ?  Critical care time was exclusive of:  Separately billable procedures and treating other patients and teaching time ?  Critical care was necessary to treat or prevent imminent or life-threatening deterioration of the following conditions:  Metabolic crisis ?  Critical care was time spent personally by me on the following activities:  Development of treatment plan with patient or surrogate, discussions with consultants, evaluation of patient's response to treatment, examination of patient, ordering and review of laboratory studies, ordering and review of radiographic studies, ordering and performing treatments and interventions, pulse oximetry, re-evaluation of patient's condition and review of old charts ?  Care discussed with: admitting provider    ? ? ?Medications Ordered in ED ?Medications  ?sodium chloride 0.9 % bolus 1,000 mL (1,000 mLs Intravenous New Bag/Given 11/24/21 1702)  ?ondansetron [facility](ZOFRAN) injection 4 mg (4 mg Intravenous Given 11/24/21 1703)  ?fentaNYL (SUBLIMAZE) injection 100 mcg (100 mcg Intravenous Given 11/24/21 1703)  ?cefTRIAXone (  ROCEPHIN) 1 g in sodium chloride 0.9 % 100 mL IVPB (0 g Intravenous Stopped 11/24/21 1759)  ?HYDROmorphone (DILAUDID) injection 1 mg (1 mg Intravenous Given 11/24/21 1759)  ? ? ?ED Course/ Medical Decision Making/ A&P ?  ?                        ?Medical Decision Making ?Amount and/or Complexity of Data Reviewed ?Labs: ordered. ?Radiology: ordered. ? ?Risk ?Prescription drug management. ?Decision regarding hospitalization. ? ? ?This patient presents to the ED for concern of dysuria and emesis, this involves an extensive number of treatment options, and is a complaint that carries with it a high risk of complications and morbidity.  The differential diagnosis  includes UTI, nephrolithiasis, pyelonephritis, sepsis, dehydration, other ? ?Patient?s presentation is complicated by their history of recent PCP diagnosis of UTI currently on Bactrim x1 day ? ? ?Additional history obta

## 2021-11-24 NOTE — Telephone Encounter (Signed)
Spouse notified me patient vomiting today and daughter took her to doctor appt today for evaluation.  Has not been at work this week.  Discussed with spouse will call patient tomorrow to follow up.  Reviewed Epic and patient admitted for pyelonephritis today.  RN Rolly Salter and HR Tim notified. ?

## 2021-11-24 NOTE — ED Triage Notes (Signed)
Reports burning with urination and malodorous, R flank pain, patient feels weak since Monday. Patient denies fever.  ?

## 2021-11-25 ENCOUNTER — Inpatient Hospital Stay (HOSPITAL_COMMUNITY): Payer: No Typology Code available for payment source

## 2021-11-25 DIAGNOSIS — Z79899 Other long term (current) drug therapy: Secondary | ICD-10-CM | POA: Diagnosis not present

## 2021-11-25 DIAGNOSIS — Z881 Allergy status to other antibiotic agents status: Secondary | ICD-10-CM | POA: Diagnosis not present

## 2021-11-25 DIAGNOSIS — E860 Dehydration: Secondary | ICD-10-CM | POA: Diagnosis present

## 2021-11-25 DIAGNOSIS — E785 Hyperlipidemia, unspecified: Secondary | ICD-10-CM | POA: Diagnosis present

## 2021-11-25 DIAGNOSIS — Z20822 Contact with and (suspected) exposure to covid-19: Secondary | ICD-10-CM | POA: Diagnosis present

## 2021-11-25 DIAGNOSIS — E1165 Type 2 diabetes mellitus with hyperglycemia: Secondary | ICD-10-CM | POA: Diagnosis present

## 2021-11-25 DIAGNOSIS — E876 Hypokalemia: Secondary | ICD-10-CM | POA: Diagnosis present

## 2021-11-25 DIAGNOSIS — R7401 Elevation of levels of liver transaminase levels: Secondary | ICD-10-CM | POA: Diagnosis not present

## 2021-11-25 DIAGNOSIS — Z880 Allergy status to penicillin: Secondary | ICD-10-CM | POA: Diagnosis not present

## 2021-11-25 DIAGNOSIS — R7989 Other specified abnormal findings of blood chemistry: Secondary | ICD-10-CM | POA: Diagnosis present

## 2021-11-25 DIAGNOSIS — J449 Chronic obstructive pulmonary disease, unspecified: Secondary | ICD-10-CM | POA: Diagnosis present

## 2021-11-25 DIAGNOSIS — K760 Fatty (change of) liver, not elsewhere classified: Secondary | ICD-10-CM | POA: Diagnosis present

## 2021-11-25 DIAGNOSIS — N12 Tubulo-interstitial nephritis, not specified as acute or chronic: Secondary | ICD-10-CM | POA: Diagnosis present

## 2021-11-25 DIAGNOSIS — R000 Tachycardia, unspecified: Secondary | ICD-10-CM | POA: Diagnosis present

## 2021-11-25 DIAGNOSIS — F17210 Nicotine dependence, cigarettes, uncomplicated: Secondary | ICD-10-CM | POA: Diagnosis present

## 2021-11-25 DIAGNOSIS — I10 Essential (primary) hypertension: Secondary | ICD-10-CM | POA: Diagnosis present

## 2021-11-25 DIAGNOSIS — Z7984 Long term (current) use of oral hypoglycemic drugs: Secondary | ICD-10-CM | POA: Diagnosis not present

## 2021-11-25 DIAGNOSIS — N390 Urinary tract infection, site not specified: Secondary | ICD-10-CM | POA: Diagnosis present

## 2021-11-25 LAB — COMPREHENSIVE METABOLIC PANEL
ALT: 66 U/L — ABNORMAL HIGH (ref 0–44)
AST: 52 U/L — ABNORMAL HIGH (ref 15–41)
Albumin: 3.2 g/dL — ABNORMAL LOW (ref 3.5–5.0)
Alkaline Phosphatase: 84 U/L (ref 38–126)
Anion gap: 11 (ref 5–15)
BUN: 25 mg/dL — ABNORMAL HIGH (ref 8–23)
CO2: 21 mmol/L — ABNORMAL LOW (ref 22–32)
Calcium: 8.5 mg/dL — ABNORMAL LOW (ref 8.9–10.3)
Chloride: 103 mmol/L (ref 98–111)
Creatinine, Ser: 0.75 mg/dL (ref 0.44–1.00)
GFR, Estimated: >60 mL/min (ref >60)
Glucose, Bld: 157 mg/dL — ABNORMAL HIGH (ref 70–99)
Potassium: 3.3 mmol/L — ABNORMAL LOW (ref 3.5–5.1)
Sodium: 135 mmol/L (ref 135–145)
Total Bilirubin: 1.5 mg/dL — ABNORMAL HIGH (ref 0.3–1.2)
Total Protein: 6.5 g/dL (ref 6.5–8.1)

## 2021-11-25 LAB — CBC
HCT: 36.6 % (ref 36.0–46.0)
Hemoglobin: 12 g/dL (ref 12.0–15.0)
MCH: 29.2 pg (ref 26.0–34.0)
MCHC: 32.8 g/dL (ref 30.0–36.0)
MCV: 89.1 fL (ref 80.0–100.0)
Platelets: 143 10*3/uL — ABNORMAL LOW (ref 150–400)
RBC: 4.11 MIL/uL (ref 3.87–5.11)
RDW: 13.6 % (ref 11.5–15.5)
WBC: 10.8 10*3/uL — ABNORMAL HIGH (ref 4.0–10.5)
nRBC: 0 % (ref 0.0–0.2)

## 2021-11-25 LAB — GLUCOSE, CAPILLARY
Glucose-Capillary: 129 mg/dL — ABNORMAL HIGH (ref 70–99)
Glucose-Capillary: 123 mg/dL — ABNORMAL HIGH (ref 70–99)
Glucose-Capillary: 122 mg/dL — ABNORMAL HIGH (ref 70–99)
Glucose-Capillary: 164 mg/dL — ABNORMAL HIGH (ref 70–99)

## 2021-11-25 LAB — HIV ANTIBODY (ROUTINE TESTING W REFLEX): HIV Screen 4th Generation wRfx: NONREACTIVE

## 2021-11-25 MED ORDER — POTASSIUM CHLORIDE CRYS ER 20 MEQ PO TBCR
40.0000 meq | EXTENDED_RELEASE_TABLET | Freq: Once | ORAL | Status: AC
  Administered 2021-11-25: 40 meq via ORAL
  Filled 2021-11-25: qty 2

## 2021-11-25 MED ORDER — ENSURE ENLIVE PO LIQD: 237.0000 mL | ORAL | Status: DC

## 2021-11-25 NOTE — Progress Notes (Signed)
[facility] ? ?PROGRESS NOTE ? ?Adeana Venters TN:[id number] DOB: [birth year] DOA: 11/24/2021 ?PCP: Maximiano Coss, NP ? ? ?Brief HPI:   ? ? 61 y.o. female, with history of hyperlipidemia, diabetes mellitus type 2, hypertension came to hospital with chief complaint of poor p.o. intake vomiting, dysuria and right flank pain for past 5 days.  Patient said that she started having dysuria, foul-smelling urine and increased frequency of urination.  She was also vomiting and was only able to drink water.  Denies abdominal pain.  She was started on Bactrim yesterday but did not take it today due to vomiting.  She complains of chills but no fever at home. ? ? ?Subjective  ? ?Patient seen and examined, developed fever last night.  Tmax 103.1. ? ? Assessment/Plan:  ? ? ? ?Acute pyonephritis ?-Patient presented with dysuria, increased frequency of urination and foul-smelling urine ?-CT abdomen/pelvis shows asymmetric perirenal stranding on the right; consistent with pyonephritis ?-Clinically improving, WBC down to 10.8 ?-Urine culture obtained ?-Patient started on IV Rocephin ?-Follow urine culture results ? ?Dehydration/prerenal azotemia ?-Patient presented with poor p.o. intake for past 5 days ?-BUN was elevated to 32 ?-Improved to 25 with IV fluids ?-Continue hydration with normal saline ? ?Diabetes mellitus type 2 ?-Continue sliding scale insulin with NovoLog ?-CBG well controlled ?-Last hemoglobin A1c was 7.6 on 11/03/2021 ? ?Hypokalemia ?-Potassium was 3.3 ?-We will replace potassium and follow BMP in am ? ?Transaminitis ?-Mild elevation of AST/ALT with total bilirubin 1.5 ?-We will obtain abdominal ultrasound ? ?Medications ? ?  ? enoxaparin (LOVENOX) injection  40 mg Subcutaneous QHS  ? insulin aspart  0-9 Units Subcutaneous TID WC  ? ? ? Data Reviewed:  ? ?CBG: ? ?Recent Labs  ?Lab 11/24/21 ?2224 11/25/21 ?0819  ?GLUCAP 142* 129*  ? ? ?SpO2: 97 %  ? ? ?Vitals:  ? 11/24/21 2322 11/25/21 0333 11/25/21 0525 11/25/21  0739  ?BP: 98/79 103/62  113/65  ?Pulse: 99 (!) 105  93  ?Resp: 16 16  17   ?Temp: 98.3 ?F (36.8 ?C) (!) 102.6 ?F (39.2 ?C) 98.9 ?F (37.2 ?C) 98.4 ?F (36.9 ?C)  ?TempSrc: Oral Oral Oral Oral  ?SpO2: 96% 95%  97%  ?Weight:      ?Height: 5\' 4"  (1.626 m)     ? ? ? ? ?Data Reviewed: ? ?Basic Metabolic Panel: ?Recent Labs  ?Lab 11/24/21 ?1541 11/24/21 ?2040 11/25/21 ?0507  ?NA 132*  --  135  ?K 3.5  --  3.3*  ?CL 97*  --  103  ?CO2 22  --  21*  ?GLUCOSE 227*  --  157*  ?BUN 32*  --  25*  ?CREATININE 1.00 0.88 0.75  ?CALCIUM 8.9  --  8.5*  ? ? ?CBC: ?Recent Labs  ?Lab 11/24/21 ?1541 11/24/21 ?2040 11/25/21 ?0507  ?WBC 14.2* 11.4* 10.8*  ?NEUTROABS 12.0*  --   --   ?HGB 14.0 12.4 12.0  ?HCT 42.6 37.0 36.6  ?MCV 89.3 87.5 89.1  ?PLT 151 133* 143*  ? ? ?LFT ?Recent Labs  ?Lab 11/24/21 ?1541 11/25/21 ?0507  ?AST 69* 52*  ?ALT 82* 66*  ?ALKPHOS 106 84  ?BILITOT 1.6* 1.5*  ?PROT 8.1 6.5  ?ALBUMIN 4.2 3.2*  ? ?  ?Antibiotics: ?Anti-infectives (From admission, onward)  ? ? Start     Dose/Rate Route Frequency Ordered Stop  ? 11/25/21 1000  cefTRIAXone (ROCEPHIN) 1 g in sodium chloride 0.9 % 100 mL IVPB       ? 1 g ?200  mL/hr over 30 Minutes Intravenous Every 24 hours 11/24/21 1954    ? 11/24/21 1645  cefTRIAXone (ROCEPHIN) 1 g in sodium chloride 0.9 % 100 mL IVPB       ? 1 g ?200 mL/hr over 30 Minutes Intravenous  Once 11/24/21 1648 11/24/21 1759  ? ?  ? ? ? ?DVT prophylaxis: Lovenox ? ?Code Status: Full code ? ?Family Communication: No family at bedside ? ? ?CONSULTS  ? ? ?Objective  ? ? ?Physical Examination: ? ?General-appears in no acute distress ?Heart-S1-S2, regular, no murmur auscultated ?Lungs-clear to auscultation bilaterally, no wheezing or crackles auscultated ?Abdomen-soft, nontender, no organomegaly ?Extremities-no edema in the lower extremities ?Neuro-alert, oriented x3, no focal deficit noted ? ? ?Status is: Inpatient: Pyelonephritis Hello ? ? ? ?  ? ? ? ? ? ? ? ?Oswald Hillock ?  ?[facility] ?If 7PM-7AM,  please contact night-coverage at [email or web address], ?Office  [phone] ? ? ?11/25/2021, 9:19 AM  LOS: 0 days  ? ? ? ? ? ? ? ? ? ? ?  ?

## 2021-11-25 NOTE — Progress Notes (Signed)
Initial Nutrition Assessment ? ? ?INTERVENTION:  ? ?-Ensure Plus High Protein po daily, each supplement provides 350 kcal and 20 grams of protein.  ? ?NUTRITION DIAGNOSIS:  ? ?Inadequate oral intake related to nausea, vomiting as evidenced by per patient/family report. ? ?GOAL:  ? ?Patient will meet greater than or equal to 90% of their needs ? ?MONITOR:  ? ?PO intake, Supplement acceptance, Labs, Weight trends, I & O's ? ?REASON FOR ASSESSMENT:  ? ?Malnutrition Screening Tool ?  ? ?ASSESSMENT:  ? ?62 y.o. female, with history of hyperlipidemia, diabetes mellitus type 2, hypertension came to hospital with chief complaint of poor p.o. intake vomiting, dysuria and right flank pain for past 5 days. ? ?Patient in room with daughter at bedside.  ?Pt reports she ate well prior to Sunday 3/12. Sunday night she started to feel ill, shaky and could not hold anything down. She stopped trying to eat the next day and mainly drank water and Gatorade.  ?She consumed 50% of her fish and most of her fruit and salad for lunch today. Denies nausea. This was her first meal she has eaten since Monday (4 days). ?She is agreeable to receiving Ensure supplements this admission. ? ?Per pt she has lost 15 lbs. UBW is 180-182 lbs. Pt reports weighing in the 160s now. This has not been shown in weight records.  ? ?Medications: KLOR-CON ? ?Labs reviewed: ? CBGs: 123-129 ?Low K ? ?NUTRITION - FOCUSED PHYSICAL EXAM: ? ?No depletions noted. ? ?Diet Order:   ?Diet Order   ? ?       ?  Diet Carb Modified Fluid consistency: Thin; Room service appropriate? Yes  Diet effective now       ?  ? ?  ?  ? ?  ? ? ?EDUCATION NEEDS:  ? ?No education needs have been identified at this time ? ?Skin:  Skin Assessment: Reviewed RN Assessment ? ?Last BM:  3/16 ? ?Height:  ? ?Ht Readings from Last 1 Encounters:  ?11/24/21 5\' 4"  (1.626 m)  ? ? ?Weight:  ? ?Wt Readings from Last 1 Encounters:  ?11/24/21 82.5 kg  ? ? ?BMI:  Body mass index is 31.22 kg/m?. ? ?Estimated  Nutritional Needs:  ? ?Kcal:  1400-1600 ? ?Protein:  65-75g ? ?Fluid:  1.6L/day ? ?Clayton Bibles, MS, RD, LDN ?Inpatient Clinical Dietitian ?Contact information available via Amion ? ?

## 2021-11-26 DIAGNOSIS — N12 Tubulo-interstitial nephritis, not specified as acute or chronic: Secondary | ICD-10-CM | POA: Diagnosis not present

## 2021-11-26 DIAGNOSIS — R7401 Elevation of levels of liver transaminase levels: Secondary | ICD-10-CM

## 2021-11-26 LAB — BLOOD CULTURE ID PANEL (REFLEXED) - BCID2

## 2021-11-26 LAB — BASIC METABOLIC PANEL
Anion gap: 8 (ref 5–15)
BUN: 20 mg/dL (ref 8–23)
CO2: 23 mmol/L (ref 22–32)
Calcium: 8.6 mg/dL — ABNORMAL LOW (ref 8.9–10.3)
Chloride: 107 mmol/L (ref 98–111)
Creatinine, Ser: 0.71 mg/dL (ref 0.44–1.00)
GFR, Estimated: >60 mL/min (ref >60)
Glucose, Bld: 150 mg/dL — ABNORMAL HIGH (ref 70–99)
Potassium: 3.5 mmol/L (ref 3.5–5.1)
Sodium: 138 mmol/L (ref 135–145)

## 2021-11-26 LAB — HEMOGLOBIN A1C
Hgb A1c MFr Bld: 7.5 % — ABNORMAL HIGH (ref 4.8–5.6)
Mean Plasma Glucose: 169 mg/dL

## 2021-11-26 LAB — URINE CULTURE

## 2021-11-26 LAB — GLUCOSE, CAPILLARY: Glucose-Capillary: 139 mg/dL — ABNORMAL HIGH (ref 70–99)

## 2021-11-26 MED ORDER — SULFAMETHOXAZOLE-TRIMETHOPRIM 800-160 MG PO TABS
1.0000 | ORAL_TABLET | Freq: Two times a day (BID) | ORAL | 0 refills | Status: DC
Start: 2021-11-26 — End: 2021-12-20

## 2021-11-26 NOTE — Discharge Summary (Addendum)
?Physician Discharge Summary ?  ?PatientBonnita Chen: Robin Chen MRN: [id number] DOB: [birth year]  ?Admit date:     11/24/2021  ?Discharge date: 11/26/21  ?Discharge Physician: Robin IdeGagan S Akiera Chen  ? ?PCP: Robin AgeeMorrow, Richard, NP  ? ?Recommendations at discharge:  ? ?Follow-up PCP in 1 week ?Will need referral for gastroenterology as outpatient for increased liver enzymes and fatty infiltration of liver ?Continue taking Bactrim DS 1 tablet p.o. twice daily for at least 10 days ?Urine culture grew multiple species, previous urine culture from 2/23 showed E. coli sensitive to Bactrim. ? ?Discharge Diagnoses: ?Principal Problem: ?  UTI (urinary tract infection) ?Active Problems: ?  Pyelonephritis ? ?Resolved Problems: ?  * No resolved hospital problems. * ? ?Hospital Course: ?61 y.o. female, with history of hyperlipidemia, diabetes mellitus type 2, hypertension came to hospital with chief complaint of poor p.o. intake vomiting, dysuria and right flank pain for past 5 days.  Patient said that she started having dysuria, foul-smelling urine and increased frequency of urination.  She was also vomiting and was only able to drink water.  Denies abdominal pain.  She was started on Bactrim yesterday but did not take it today due to vomiting.  She complains of chills but no fever at home. ?  ? ?Assessment and Plan: ? ?Acute pyonephritis ?-Patient presented with dysuria, increased frequency of urination and foul-smelling urine ?-CT abdomen/pelvis shows asymmetric perirenal stranding on the right; consistent with pyonephritis ?-Clinically improving, WBC down to 10.8 ?-Urine culture obtained; grew multiple species ?-Patient started on IV Rocephin ?-Patient wants to go home, urine culture only grew multiple species.  Looking at previous culture results from 11/03/2021, urine culture grew E. coli sensitive to Bactrim.  Patient only took 3 days of Bactrim.  She was prescribed 40 pills and she still has a bottle full of Bactrim at home.  I have recommended  patient to continue taking Bactrim for 10 more days to complete the treatment for pyelonephritis. ?  ?Dehydration/prerenal azotemia ?-Patient presented with poor p.o. intake for past 5 days ?-BUN was elevated to 32 ?-Resolved with IV hydration ?  ?Diabetes mellitus type 2 ?-Continue home regimen ?  ?Hypokalemia ?-Replete ?  ?Transaminitis ?-Mild elevation of AST/ALT with total bilirubin 1.5 ?-Abdominal ultrasound shows fatty infiltration ?-She will need gastroenterology work-up as outpatient. ? ?  ? ? ?Consultants:  ?Procedures performed:   ?Disposition: Home ?Diet recommendation:  ?Discharge Diet Orders (From admission, onward)  ? ?  Start     Ordered  ? 11/26/21 0000  Diet - low sodium heart healthy       ? 11/26/21 [id number]  ? ?  ?  ? ?  ? ?Regular diet ?DISCHARGE MEDICATION: ?Allergies as of 11/26/2021   ? ?   Reactions  ? Penicillins Rash, Other (See Comments)  ? Has patient had a PCN reaction causing immediate rash, facial/tongue/throat swelling, SOB or lightheadedness with hypotension: No ?Has patient had a PCN reaction causing severe rash involving mucus membranes or skin necrosis: No ?Has patient had a PCN reaction that required hospitalization No ?Has patient had a PCN reaction occurring within the last 10 years: No ?If all of the above answers are "NO", then may proceed with Cephalosporin use.  ? Ciprofloxacin Rash  ? ?  ? ?  ?Medication List  ?  ? ?TAKE these medications   ? ?metFORMIN 1000 MG tablet ?Commonly known as: GLUCOPHAGE ?Take 1 tablet (1,000 mg total) by mouth 2 (two) times daily with a meal. ?  ?sulfamethoxazole-trimethoprim 800-160 MG  tablet ?Commonly known as: BACTRIM DS ?Take 1 tablet by mouth 2 (two) times daily. ?  ?Vitamin D3 1.25 MG (50000 UT) Caps ?Take 1 capsule by mouth every Saturday. ?  ? ?  ? ? Follow-up Information   ? ? Robin Agee, NP Follow up in 1 week(s).   ?Specialty: Adult Health Nurse Practitioner ?Why: Follow-up PCP in 1 week.  You need referral for gastroenterology as  outpatient for increased liver enzymes and fatty infiltration of liver. ?Contact information: ?[address] ?[place] [address] ?[phone] ? ? ?  ?  ? ?  ?  ? ?  ? ?Discharge Exam: ?Filed Weights  ? 11/24/21 2019  ?Weight: 82.5 kg  ? ?General-appears in no acute distress ?Heart-S1-S2, regular, no murmur auscultated ?Lungs-clear to auscultation bilaterally, no wheezing or crackles auscultated ?Abdomen-soft, nontender, no organomegaly ?Extremities-no edema in the lower extremities ?Neuro-alert, oriented x3, no focal deficit noted ? ?Condition at discharge: good ? ?The results of significant diagnostics from this hospitalization (including imaging, microbiology, ancillary and laboratory) are listed below for reference.  ? ?Imaging Studies: ?US Abdomen Complete ? ?Result Date: 11/25/2021 ?CLINICAL DATA:  Transaminitis EXAM: ABDOMEN ULTRASOUND COMPLETE COMPARISON:  CT November 24, 2021 FINDINGS: Gallbladder: No gallstones or wall thickening visualized. No sonographic Murphy sign noted by sonographer. Common bile duct: Diameter: 3 mm Liver: No focal lesion identified. Increased hepatic parenchymal echogenicity parenchymal echogenicity. Portal vein is patent on color Doppler imaging with normal direction of blood flow towards the liver. IVC: No abnormality visualized. Pancreas: Visualized portion unremarkable. Spleen: Size and appearance within normal limits. Right Kidney: Length: 11.4 cm. Echogenicity within normal limits. No mass or hydronephrosis visualized. Left Kidney: Length: 11.7 cm. Echogenicity within normal limits. No mass or hydronephrosis visualized. Abdominal aorta: No aneurysm visualized. Other findings: None. IMPRESSION: The echogenicity of the liver is increased. This is a nonspecific finding but is most commonly seen with fatty infiltration of the liver. There are no obvious focal liver lesions. Electronically Signed   By: Maudry Mayhew M.D.   On: 11/25/2021 10:53  ? ?CT Renal Stone Study ? ?Result Date:  11/24/2021 ?CLINICAL DATA:  Dysuria. Muller S urine. Right flank pain. Weakness. EXAM: CT ABDOMEN AND PELVIS WITHOUT CONTRAST TECHNIQUE: Multidetector CT imaging of the abdomen and pelvis was performed following the standard protocol without IV contrast. RADIATION DOSE REDUCTION: This exam was performed according to the departmental dose-optimization program which includes automated exposure control, adjustment of the mA and/or kV according to patient size and/or use of iterative reconstruction technique. COMPARISON:  08/10/2017 FINDINGS: Lower chest: Small type 1 hiatal hernia. Mild descending thoracic aortic atherosclerotic calcification. Hepatobiliary: Diffuse hepatic steatosis. Calcifications in segment 8 of the liver on image 13 series 2 unchanged from 11/06/2016, likely benign postinflammatory calcifications. Mildly contracted gallbladder. Pancreas: Unremarkable Spleen: Unremarkable Adrenals/Urinary Tract: Both adrenal glands appear normal. Right greater than left perirenal stranding along with edema tracking along the margins of the right perirenal fascia. Mild effacement of fat planes along the right renal hilum. No Cabriales in the kidney or collecting system. No hydronephrosis or urinary tract calculi identified. Stomach/Bowel: Unremarkable.  Normal appendix. Vascular/Lymphatic: Atherosclerosis is present, including aortoiliac atherosclerotic disease. Reproductive: Stable calcification along the right adnexa. Other: No supplemental non-categorized findings. Musculoskeletal: Lumbar degenerative disc disease causing suspected impingement at L3-4 and L4-5. IMPRESSION: 1. Asymmetric perirenal stranding on the right along with indistinctness of fat planes along the right renal hilum, suspicious for right renal inflammation. In this clinical context, the appearance raises concern for  potential pyelonephritis. No Epps is identified in the right kidney or collecting system and no hydronephrosis is observed. 2. Other  imaging findings of potential clinical significance: Small type 1 hiatal hernia. Aortic Atherosclerosis (ICD10-I70.0). Diffuse hepatic steatosis. Lumbar impingement at L3-4 and L4-5. Electronically Signed

## 2021-11-26 NOTE — Telephone Encounter (Signed)
Patient contacted via telephone was discharged this am and arrived home around noon.  Feeling better today and still taking bactrim DS po BID.  Patient has follow up with [facility] Monday and plans to discuss GI referral for fatty liver.  Patient notified if any concerns between now and [facility] appt may contact me at [phone].  Notified patient RN Rolly Salter not in clinic until Tuesday also.  Patient A&Ox3 spoke full sentences without difficulty  Denied questions or concerns at this time. ?

## 2021-11-28 LAB — CULTURE, BLOOD (ROUTINE X 2): Special Requests: ADEQUATE

## 2021-11-28 NOTE — Progress Notes (Signed)
Patient's blood culture results 1 out of 4 bottles grew Staph hominis.  Likely contamination.  Called and discussed with patient's son Lacole, Komorowski on phone.  Patient is afebrile, asymptomatic.  Recommended to complete antibiotics prescribed at the time of discharge. ?

## 2021-11-29 NOTE — Telephone Encounter (Signed)
HR Tonya notified me no provider note in her email or work box.  She is notifying patient she will have to go home until she obtains clearance from her provider to return to work.  RN Rolly Salter and I reveived Epic/AVS and no return to work note found. ?

## 2021-11-29 NOTE — Telephone Encounter (Signed)
Patient at work today stated PCM cleared her to return today.  Feeling well denied concerns.  A&Ox3 spoke full sentences without difficulty taking her antibiotic as prescribed skin warm dry and pink respiration even and unlabored.  Has water bottle at desk and encouraged her to hydrate today.  Avoid holding urine.  Patient verbalized understanding information/instructions, agreed with plan of care and had no further questions at this time. ?

## 2021-11-30 LAB — CULTURE, BLOOD (ROUTINE X 2)
Culture: NO GROWTH
Special Requests: ADEQUATE

## 2021-11-30 NOTE — Telephone Encounter (Signed)
Patient reported HR contacted her yesterday and she called her provider's office to get work clearance note.  It was faxed yesterday but she had to leave work until note received so stopped shift early 1200 yesterday, rested yesterday afternoon at home and feeling better today more energy and back at work.  Patient denied further questions or concerns at this time.  A&Ox3 spoke full sentences without difficulty. ?

## 2021-12-01 NOTE — Telephone Encounter (Signed)
Patient at work today denied concerns. ?

## 2021-12-07 NOTE — Telephone Encounter (Signed)
Patient reported feeling well.  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  A&Ox3 spoke full sentences without difficulty.  Skin warm dry and pink.  Gait sure and steady.  Hydrating with water. ?

## 2021-12-08 NOTE — Telephone Encounter (Signed)
Patient reported her provider leaving office and will need assistance scheduling appts.  Notified RN Rolly Salter in CPR class tomorrow but can assist her on Monday with finding new network provider.  Patient stated she will follow up with RN Rolly Salter on Monday 12/12/21.  Patient needs PCM and GI follow up. ?

## 2021-12-15 NOTE — Telephone Encounter (Signed)
New PCP appt made with pt in clinic. Online scheduling thru [facility] at [facility] in [place]. This is her preferred office as it is close to her home. Appt made for 12/29/21 at 1600. Advised pcp can process her GI referral once they have seen her and reviewed recent admission. Pt agreeable and denies further needs.  ?

## 2021-12-15 NOTE — Telephone Encounter (Signed)
Reviewed RN Haley note agreed with plan of care. 

## 2021-12-19 ENCOUNTER — Other Ambulatory Visit: Payer: Self-pay | Admitting: Registered Nurse

## 2021-12-19 ENCOUNTER — Ambulatory Visit: Payer: Self-pay | Admitting: *Deleted

## 2021-12-19 DIAGNOSIS — R300 Dysuria: Secondary | ICD-10-CM

## 2021-12-19 LAB — POCT URINALYSIS DIPSTICK
Bilirubin, UA: NEGATIVE
Blood, UA: NEGATIVE
Glucose, UA: NEGATIVE mg/dL
Ketones, POC UA: NEGATIVE mg/dL
Leukocytes, UA: NEGATIVE
Nitrite, UA: NEGATIVE
Protein Ur, POC: 30 mg/dL — AB
Specific Gravity, UA: 1.025 (ref 1.005–1.030)
Urobilinogen, UA: 0.2 E.U./dL
pH, UA: 6 (ref 5.0–8.0)

## 2021-12-19 NOTE — Progress Notes (Signed)
Pt in to clinic reporting recurrent pyelo sx over this past weekend. Sts bilateral flank pain and suprapubic pain returned with fever and chills Fri-Sun. Sts sx began improving yesterday and she does not have active sx right now.  ?POC UA dip performed in clinic. Collected urine culture as well for repeat since recurrent sx. Urine sample today dark yellow, almost tea colored and concentrated.  ?POC with + protein.  ?Appt made with NP Inetta Fermo in clinic for tomorrow at 0930. ?

## 2021-12-20 ENCOUNTER — Other Ambulatory Visit: Payer: Self-pay | Admitting: Registered Nurse

## 2021-12-20 ENCOUNTER — Ambulatory Visit: Payer: Self-pay | Admitting: Registered Nurse

## 2021-12-20 VITALS — BP 131/90 | HR 101 | Temp 98.6°F

## 2021-12-20 DIAGNOSIS — K5909 Other constipation: Secondary | ICD-10-CM

## 2021-12-20 DIAGNOSIS — R509 Fever, unspecified: Secondary | ICD-10-CM

## 2021-12-20 DIAGNOSIS — Z87448 Personal history of other diseases of urinary system: Secondary | ICD-10-CM

## 2021-12-20 DIAGNOSIS — R809 Proteinuria, unspecified: Secondary | ICD-10-CM

## 2021-12-20 DIAGNOSIS — R7401 Elevation of levels of liver transaminase levels: Secondary | ICD-10-CM

## 2021-12-20 DIAGNOSIS — R12 Heartburn: Secondary | ICD-10-CM

## 2021-12-20 MED ORDER — POLYETHYLENE GLYCOL 3350 17 GM/SCOOP PO POWD: 17.0000 g | Freq: Every day | ORAL | 1 refills | Status: DC | PRN

## 2021-12-20 MED ORDER — OMEPRAZOLE 20 MG PO CPDR: 20.0000 mg | DELAYED_RELEASE_CAPSULE | Freq: Every day | ORAL | 0 refills | Status: AC

## 2021-12-20 MED ORDER — SULFAMETHOXAZOLE-TRIMETHOPRIM 800-160 MG PO TABS
1.0000 | ORAL_TABLET | Freq: Two times a day (BID) | ORAL | 0 refills | Status: DC
Start: 2021-12-20 — End: 2021-12-25

## 2021-12-20 NOTE — Patient Instructions (Addendum)
High-Fiber Eating Plan ?Fiber, also called dietary fiber, is a type of carbohydrate. It is found foods such as fruits, vegetables, whole grains, and beans. A high-fiber diet can have many health benefits. Your health care provider may recommend a high-fiber diet to help: ?Prevent constipation. Fiber can make your bowel movements more regular. ?Lower your cholesterol. ?Relieve the following conditions: ?Inflammation of veins in the anus (hemorrhoids). ?Inflammation of specific areas of the digestive tract (uncomplicated diverticulosis). ?A problem of the large intestine, also called the colon, that sometimes causes pain and diarrhea (irritable bowel syndrome, or IBS). ?Prevent overeating as part of a weight-loss plan. ?Prevent heart disease, type 2 diabetes, and certain cancers. ?What are tips for following this plan? ?Reading food labels ? ?Check the nutrition facts label on food products for the amount of dietary fiber. Choose foods that have 5 grams of fiber or more per serving. ?The goals for recommended daily fiber intake include: ?Men (age 62 or younger): 34-38 g. ?Men (over age 62): 28-34 g. ?Women (age 62 or younger): 25-28 g. ?Women (over age 62): 22-25 g. ?Your daily fiber goal is _____________ g. ?Shopping ?Choose whole fruits and vegetables instead of processed forms, such as apple juice or applesauce. ?Choose a wide variety of high-fiber foods such as avocados, lentils, oats, and kidney beans. ?Read the nutrition facts label of the foods you choose. Be aware of foods with added fiber. These foods often have high sugar and sodium amounts per serving. ?Cooking ?Use whole-grain flour for baking and cooking. ?Cook with brown rice instead of white rice. ?Meal planning ?Start the day with a breakfast that is high in fiber, such as a cereal that contains 5 g of fiber or more per serving. ?Eat breads and cereals that are made with whole-grain flour instead of refined flour or white flour. ?Eat brown rice, bulgur  wheat, or millet instead of white rice. ?Use beans in place of meat in soups, salads, and pasta dishes. ?Be sure that half of the grains you eat each day are whole grains. ?General information ?You can get the recommended daily intake of dietary fiber by: ?Eating a variety of fruits, vegetables, grains, nuts, and beans. ?Taking a fiber supplement if you are not able to take in enough fiber in your diet. It is better to get fiber through food than from a supplement. ?Gradually increase how much fiber you consume. If you increase your intake of dietary fiber too quickly, you may have bloating, cramping, or [place]. ?Drink plenty of water to help you digest fiber. ?Choose high-fiber snacks, such as berries, raw vegetables, nuts, and popcorn. ?What foods should I eat? ?Fruits ?Berries. Pears. Apples. Oranges. Avocado. Prunes and raisins. Dried figs. ?Vegetables ?Sweet potatoes. Spinach. Kale. Artichokes. Cabbage. Broccoli. Cauliflower. Green peas. Carrots. Squash. ?Grains ?Whole-grain breads. Multigrain cereal. Oats and oatmeal. Brown rice. Barley. Bulgur wheat. Millet. Quinoa. Bran muffins. Popcorn. Rye wafer crackers. ?Meats and other proteins ?Navy beans, kidney beans, and pinto beans. Soybeans. Split peas. Lentils. Nuts and seeds. ?Dairy ?Fiber-fortified yogurt. ?Beverages ?Fiber-fortified soy milk. Fiber-fortified orange juice. ?Other foods ?Fiber bars. ?The items listed above may not be a complete list of recommended foods and beverages. Contact a dietitian for more information. ?What foods should I avoid? ?Fruits ?Fruit juice. Cooked, strained fruit. ?Vegetables ?Fried potatoes. Canned vegetables. Well-cooked vegetables. ?Grains ?White bread. Pasta made with refined flour. White rice. ?Meats and other proteins ?Fatty cuts of meat. Fried chicken or fried fish. ?Dairy ?Milk. Yogurt. Cream cheese. Sour cream. ?Fats and  oils ?Butters. ?Beverages ?Soft drinks. ?Other foods ?Cakes and pastries. ?The items listed above may  not be a complete list of foods and beverages to avoid. Talk with your dietitian about what choices are best for you. ?Summary ?Fiber is a type of carbohydrate. It is found in foods such as fruits, vegetables, whole grains, and beans. ?A high-fiber diet has many benefits. It can help to prevent constipation, lower blood cholesterol, aid weight loss, and reduce your risk of heart disease, diabetes, and certain cancers. ?Increase your intake of fiber gradually. Increasing fiber too quickly may cause cramping, bloating, and [place]. Drink plenty of water while you increase the amount of fiber you consume. ?The best sources of fiber include whole fruits and vegetables, whole grains, nuts, seeds, and beans. ?This information is not intended to replace advice given to you by your health care provider. Make sure you discuss any questions you have with your health care provider. ?Document Revised: 01/01/2020 Document Reviewed: 01/01/2020 ?Elsevier Patient Education ? 2022 [employer]. ?Constipation, Adult ?Constipation is when a person has fewer than three bowel movements in a week, has difficulty having a bowel movement, or has stools (feces) that are dry, hard, or larger than normal. Constipation may be caused by an underlying condition. It may become worse with age if a person takes certain medicines and does not take in enough fluids. ?Follow these instructions at home: ?Eating and drinking ? ?Eat foods that have a lot of fiber, such as beans, whole grains, and fresh fruits and vegetables. ?Limit foods that are low in fiber and high in fat and processed sugars, such as fried or sweet foods. These include french fries, hamburgers, cookies, candies, and soda. ?Drink enough fluid to keep your urine pale yellow. ?General instructions ?Exercise regularly or as told by your health care provider. Try to do 150 minutes of moderate exercise each week. ?Use the bathroom when you have the urge to go. Do not hold it in. ?Take  over-the-counter and prescription medicines only as told by your health care provider. This includes any fiber supplements. ?During bowel movements: ?Practice deep breathing while relaxing the lower abdomen. ?Practice pelvic floor relaxation. ?Watch your condition for any changes. Let your health care provider know about them. ?Keep all follow-up visits as told by your health care provider. This is important. ?Contact a health care provider if: ?You have pain that gets worse. ?You have a fever. ?You do not have a bowel movement after 4 days. ?You vomit. ?You are not hungry or you lose weight. ?You are bleeding from the opening between the buttocks (anus). ?You have thin, pencil-like stools. ?Get help right away if: ?You have a fever and your symptoms suddenly get worse. ?You leak stool or have blood in your stool. ?Your abdomen is bloated. ?You have severe pain in your abdomen. ?You feel dizzy or you faint. ?Summary ?Constipation is when a person has fewer than three bowel movements in a week, has difficulty having a bowel movement, or has stools (feces) that are dry, hard, or larger than normal. ?Eat foods that have a lot of fiber, such as beans, whole grains, and fresh fruits and vegetables. ?Drink enough fluid to keep your urine pale yellow. ?Take over-the-counter and prescription medicines only as told by your health care provider. This includes any fiber supplements. ?This information is not intended to replace advice given to you by your health care provider. Make sure you discuss any questions you have with your health care provider. ?Document Revised:  07/16/2019 Document Reviewed: 07/16/2019 ?Elsevier Patient Education ? 2022 Elsevier Inc. ?Pyelonephritis, Adult ?Pyelonephritis is an infection that occurs in the kidney. The kidneys are the organs that filter a person's blood and move waste out of the bloodstream and into the urine. Urine passes from the kidneys, through tubes called ureters, and into the  bladder. There are two main types of pyelonephritis: ?Infections that come on quickly without any warning (acute pyelonephritis). ?Infections that last for a long period of time (chronic pyelonephritis). ?In mo

## 2021-12-20 NOTE — Telephone Encounter (Signed)
Patient seen in clinic today see office note labs drawn and sent to [facility] along with urine culture ?

## 2021-12-20 NOTE — Progress Notes (Signed)
? ?Subjective:  ? ? Patient ID: Robin Chen, female    DOB: 1961/04/24, 61 y.o.   MRN: [id number] ? ?60y/o married caucasian female established patient here for evaluation fever/back pain/chills/epigastric pain.  History of hospitalization 11/24/2021 for pyelonephritis completed bactrim DS course and symptoms had completely resolved until late last week had fever 102F that broke then chills. Denied fever or chills in the previous 24 hours.  Urine sometimes darker than normal and othertimes clear like water.  Constipation the past 2 days.  Burping and heartburn the past couple of days.  Some stress at work, children coming to visit then traveling international [place] vacation end of month.  Spouse having some health concerns.  She has not had follow up with GI after hospitalization fatty liver ? ? ? ? ?Review of Systems ? ?   ?Objective:  ? Physical Exam ? ? Latest Reference Range & Units 11/25/21 05:07  ?Sodium 135 - 145 mmol/L 135  ?Potassium 3.5 - 5.1 mmol/L 3.3 (L)  ?Chloride 98 - 111 mmol/L 103  ?CO2 22 - 32 mmol/L 21 (L)  ?Glucose 70 - 99 mg/dL 300 (H)  ?BUN 8 - 23 mg/dL 25 (H)  ?Creatinine 0.44 - 1.00 mg/dL 7.62  ?Calcium 8.9 - 10.3 mg/dL 8.5 (L)  ?Anion gap 5 - 15  11  ?Alkaline Phosphatase 38 - 126 U/L 84  ?Albumin 3.5 - 5.0 g/dL 3.2 (L)  ?AST 15 - 41 U/L 52 (H)  ?ALT 0 - 44 U/L 66 (H)  ?Total Protein 6.5 - 8.1 g/dL 6.5  ?Total Bilirubin 0.3 - 1.2 mg/dL 1.5 (H)  ?GFR, Estimated >60 mL/min >60  ?(L): Data is abnormally low ?(H): Data is abnormally high ? ?Last urine culture e.coli not resistant to any antibiotics.  Patient asked if POCT glucose could be performed since working on her diet and taking metformin.  RN Rolly Salter completed and notified patient of results nonfasting 134. ? ? ?   ?Assessment & Plan:  ?A-fever unspecified, heartburn, proteinuria, elevated AST/ALT, constipation unspecified and history of pyelonephritis ? ?P-Recommended nephrology consultation.  Patient refused at this time.  Urinalysis  results discussed with patient in clinic today.  Exitcare handouts on proteinuria, pyelonephritis and UTI printed and given to patient.  Urine culture sent to [facility] yesterday.  Patient increased regular water intake and not drinking as much mineral water now.  Dispensed bactrim DS po BID #40 RF0 discussed to take for 10 days from [facility] to patient due to fever/back pain/chills and recent hospitalization.  CBC, CMET and magnesium drawn today results pending.  Patient allergic to penicillin and ciprofloxan.  Reviewed up to date and recommended treatment bactrim ds x 10 days.  Patient will not be at work on Thursday and asked me to call her when lab results available.  Patient reported urine clear like water today not dark.  Denied n/v/d.  Having some constipation x 2 days and fever/chills resolved.  Patient planning to have follow up with provider later this month in [place] for her kidneys.  Discussed avoid dehydration and NSAIDS use.  Patient may take tylenol 500-1000mg  po q6h prn pain/fever.  Mild pain with CVA percussion right today.  And suprapubic pain with palpation.    Patient verbalized understanding information/instructions and had no further questions at this time. ? ?Anti-reflux measures such as raising the head of the bed, avoiding tight clothing or belts, avoiding eating late at night and not lying down shortly after mealtime and achieving weight loss were discussed. Avoid ASA,  NSAID's, caffeine, peppermints, alcohol and tobacco. OTC H2 blockers and/or antacids are often very helpful for PRN use.  However, for chronic or daily symptoms, prescription strength H2 blockers or a trial of PPI's should be used. Rx omeprazole DR 20mg  po daily #90 RF0 dispensed from [facility] to patient today for use x 2 weeks.  Patient has planned follow up with GI for elevated AST/ALT/fatty liver.  Patient should alert me if there are persistent symptoms, dysphagia, weight loss or GI bleeding (bright red or black). Follow up with  clinic provider or PCM if symptoms persist despite therapy. Exitcare handout on heartburn printed and given to patient.  Discussed suspect ulcer from stress spouse illness, her recent hospitalization. CMP/CBC/Magnesium sent to [facility] today.  Patient verbalized agreement and understanding of treatment plan and had no further questions at this time.    ?  ?P2:  Diet, Food avoidance that aggravate condition, and Fitness   ? ?Discussed with patient: start miralax 1 capful daily prn constipation titrate to one stool daily not straining or having diarrhea.  Electronic Rx sent to her pharmacy of choice #1 RF1. ?High-fiber diet ?Eat more high-fiber foods, which will help prevent constipation. The best sources of fiber are whole-grain cereals, such as shredded wheat or cereals with bran. Fresh ?fruit and raw or cooked vegetables, especially asparagus, cabbage, carrots, corn, and broccoli are other good sources of fiber. ? Fluids ?Drink plenty of water. This helps to soften bowel movements so they are easier to pass.  Exercise.  Attempt diet modification. Patient given Exitcare handout on constipation/dietary fiber. Patient agreed with plan of care verbalized understanding of information/instructions and had no further questions at this time. ?P2: increase fruits/fiber/whole grains and fluid intake  ?

## 2021-12-21 LAB — COMPREHENSIVE METABOLIC PANEL
ALT: 28 IU/L (ref 0–32)
AST: 30 IU/L (ref 0–40)
Albumin/Globulin Ratio: 2.1 (ref 1.2–2.2)
Albumin: 4.7 g/dL (ref 3.8–4.8)
Alkaline Phosphatase: 115 IU/L (ref 44–121)
BUN/Creatinine Ratio: 28 (ref 12–28)
BUN: 18 mg/dL (ref 8–27)
Bilirubin Total: 0.5 mg/dL (ref 0.0–1.2)
CO2: 20 mmol/L (ref 20–29)
Calcium: 9.2 mg/dL (ref 8.7–10.3)
Chloride: 105 mmol/L (ref 96–106)
Creatinine, Ser: 0.65 mg/dL (ref 0.57–1.00)
Globulin, Total: 2.2 g/dL (ref 1.5–4.5)
Glucose: 134 mg/dL — ABNORMAL HIGH (ref 70–99)
Potassium: 3.8 mmol/L (ref 3.5–5.2)
Sodium: 141 mmol/L (ref 134–144)
Total Protein: 6.9 g/dL (ref 6.0–8.5)
eGFR: 100 mL/min/{1.73_m2} (ref >59)

## 2021-12-21 LAB — CBC WITH DIFFERENTIAL/PLATELET
Basophils Absolute: 0 10*3/uL (ref 0.0–0.2)
Basos: 0 %
EOS (ABSOLUTE): 0.1 10*3/uL (ref 0.0–0.4)
Eos: 2 %
Hematocrit: 41.4 % (ref 34.0–46.6)
Hemoglobin: 14 g/dL (ref 11.1–15.9)
Immature Grans (Abs): 0 10*3/uL (ref 0.0–0.1)
Immature Granulocytes: 0 %
Lymphocytes Absolute: 1.5 10*3/uL (ref 0.7–3.1)
Lymphs: 18 %
MCH: 29.6 pg (ref 26.6–33.0)
MCHC: 33.8 g/dL (ref 31.5–35.7)
MCV: 88 fL (ref 79–97)
Monocytes Absolute: 0.5 10*3/uL (ref 0.1–0.9)
Monocytes: 6 %
Neutrophils Absolute: 6.1 10*3/uL (ref 1.4–7.0)
Neutrophils: 74 %
Platelets: 139 10*3/uL — ABNORMAL LOW (ref 150–450)
RBC: 4.73 x10E6/uL (ref 3.77–5.28)
RDW: 14.2 % (ref 11.7–15.4)
WBC: 8.2 10*3/uL (ref 3.4–10.8)

## 2021-12-21 LAB — MAGNESIUM: Magnesium: 1.9 mg/dL (ref 1.6–2.3)

## 2021-12-23 ENCOUNTER — Telehealth: Payer: Self-pay | Admitting: Registered Nurse

## 2021-12-23 DIAGNOSIS — N390 Urinary tract infection, site not specified: Secondary | ICD-10-CM

## 2021-12-23 LAB — URINE CULTURE

## 2021-12-23 MED ORDER — NITROFURANTOIN MONOHYD MACRO 100 MG PO CAPS: 100.0000 mg | ORAL_CAPSULE | Freq: Two times a day (BID) | ORAL | 0 refills | Status: DC

## 2021-12-23 NOTE — Telephone Encounter (Signed)
Enterococcus faecalis ?For Enterococcus species, aminoglycosides (except for high-level ?resistance screening), cephalosporins, clindamycin, and ?trimethoprim-sulfamethoxazole are not effective clinically. ?(CLSI, M100-S26, 2016) ?50,000-100,000 colony forming units per mL ?Abnormal   ?Result 2  ?Escherichia coli, identified by an automated biochemical system. ?Cefazolin <=4 ug/mL ?Cefazolin with an MIC <=16 predicts susceptibility to the oral agents ?cefaclor, cefdinir, cefpodoxime, cefprozil, cefuroxime, cephalexin, ?and loracarbef when used for therapy of uncomplicated urinary tract ?infections due to E. coli, Klebsiella pneumoniae, and Proteus ?mirabilis. ?10,000-25,000 colony forming units per mL ?Abnormal   ?Antimicrobial Susceptibility  ?** S = Susceptible; I = Intermediate; R = Resistant ** ?                   P = Positive; N = Negative ?            MICS are expressed in micrograms per mL ?   Antibiotic                 RSLT#1    RSLT#2    RSLT#3    RSLT#4 ?Amoxicillin/Clavulanic Acid              S ?Ampicillin                               R ?Cefepime                                 S ?Ceftriaxone                              S ?Cefuroxime                               S ?Ciprofloxacin                  S         S ?Ertapenem                                S ?Gentamicin                               S ?Imipenem                                 S ?Levofloxacin                   S         S ?Meropenem                                S ?Nitrofurantoin                 S         S ?Penicillin                     S ?Piperacillin/Tazobactam                  S ?Tetracycline  R         S ?Tobramycin                               S ?Trimethoprim/Sulfa                       R ?Vancomycin                     S ? ?Bacteria resistant to bactrim DS will change to nitrofurantoin 100mg  po BID x 10 days #20 RF0 electronic Rx sent to patient pharmacy of choice.  She is to stop bactrim DS/trimethoprim and start  nitrofurantoin/macrobid today.  Continue pushing water.  Take with food.  Discussed may cause gold discoloration to urine.  Repeat urinalysis/urine culture in 2 weeks.  Denied any new or worsening symptoms since last telephone call.  Patient verbalized understanding information/instructions, agreed with plan of care and had no further questions at this time. ?

## 2021-12-25 MED ORDER — DOXYCYCLINE HYCLATE 100 MG PO TABS: 100.0000 mg | ORAL_TABLET | Freq: Two times a day (BID) | ORAL | 0 refills | Status: AC

## 2021-12-25 NOTE — Telephone Encounter (Signed)
Patient contacted via telephone and reported had chills, sweats and vomiting after starting nitrofurantoin  Has checked temperature and 71F  Vomiting after taking nitrofurantoin only this am x 1.  Tolerating po intake without difficulty hydrating this afternoon.  Denied dark urine/fever/chills today.  Last was around midnight.  Discussed with patient should not be throwing up with nitrofurantoin.  Will change to doxycycline 100mg  po BID.  Discussed with patient e coli did not show resistance to tetracyclines.  Patient allergic to penicillins and ciprofloxacin.  See results note for urine culture results this past week.  Patient on vacation at home with family until Thursday this week.  Instructed patient to contact me if fever/chills/vomiting/new symptoms tomorrow.  Continue hydrating.  Discussed should not be taking trimethoprim or nitrofurantoin now stopping these both.  Asked if patient would like zofran Rx for vomiting and she said no.  Pharmacy patient preferred closed until 0900 tomorrow Monday am.  Patient stated she will pick up her new rx in am.  Patient verbalized understanding information/instructions and had no further questions at this time. ?

## 2021-12-27 NOTE — Telephone Encounter (Signed)
NP Inetta Fermo advised RN that pt had n/v with macrobid and was switched to Doxy. Called pt today to ensure tolerating Doxy well. She reports that she did not hear from her pharmacy that it was ready so she has not picked up or started it. She will go there today to get it. She has not been taking any abx since last Macrobid dose this weekend. However she sts she is feeling well with no current symptoms. Continue pushing fluids to keep urine clear to pale yellow. Pt verbalizes understanding and agreement. Denies questions or concerns. Will f/u tomorrow or Thursday to check Doxy toleration. ?

## 2021-12-27 NOTE — Telephone Encounter (Signed)
Noted will follow up with patient tomorrow via telephone as had not started doxycycline. ?

## 2021-12-28 NOTE — Telephone Encounter (Signed)
Patient contacted via telephone stated feeling well took first dose doxycycline this am.  Urine regular color denied pain/n/v/d/fever/chills.  Plans to be at work tomorrow.  Has appt with [facility] provider tomorrow at 1600.  Patient A&Ox3 spoke full sentences without difficulty. ?

## 2021-12-29 DIAGNOSIS — Z87448 Personal history of other diseases of urinary system: Secondary | ICD-10-CM | POA: Insufficient documentation

## 2021-12-29 NOTE — Telephone Encounter (Signed)
Reviewed Epic patient kept appt at [facility] with new PCM today.  Labs ordered by her provider. ?

## 2022-01-03 NOTE — Telephone Encounter (Signed)
See tcon dated 12/23/21 ?

## 2022-01-03 NOTE — Telephone Encounter (Signed)
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  Has vacation starting 30 Apr - 5 Jun traveling to [place] (international).  Has fatty liver GI appt and optometry diabetic eye exam scheduled upon her return.  Had good office visit with new PCM.  Denied questions or concerns at this time. ?

## 2022-02-16 ENCOUNTER — Other Ambulatory Visit: Payer: Self-pay | Admitting: Registered Nurse

## 2022-02-16 DIAGNOSIS — E119 Type 2 diabetes mellitus without complications: Secondary | ICD-10-CM

## 2022-02-16 DIAGNOSIS — E559 Vitamin D deficiency, unspecified: Secondary | ICD-10-CM

## 2022-02-17 LAB — HGB A1C W/O EAG: Hgb A1c MFr Bld: 6.6 % — ABNORMAL HIGH (ref 4.8–5.6)

## 2022-02-17 LAB — VITAMIN D 25 HYDROXY (VIT D DEFICIENCY, FRACTURES): Vit D, 25-Hydroxy: 51.5 ng/mL (ref 30.0–100.0)

## 2022-02-19 MED ORDER — VITAMIN D3 1.25 MG (50000 UT) PO CAPS: 1.0000 | ORAL_CAPSULE | ORAL | 0 refills | Status: DC

## 2022-02-19 NOTE — Addendum Note (Signed)
Addended by: Albina Billet A on: 02/19/2022 06:51 PM   Modules accepted: Orders, Level of Service

## 2022-02-21 NOTE — Progress Notes (Signed)
Called pt to review results. No answer. LVM. Appt emailed to pt for recheck of A1c and Vitamin D level 05/26/22 at 10:30.

## 2022-02-21 NOTE — Progress Notes (Signed)
Noted follow up labs scheduled 

## 2022-02-24 NOTE — Progress Notes (Signed)
Late entry. Pt to clinic just after VM was left. Reviewed results, continued 50000 unit Vit D dosing, and confirmed f/u lab appt.

## 2022-02-26 NOTE — Progress Notes (Signed)
Reviewed RN Rolly Salter note patient results reviewed/picked up refill vitamin D and scheduled follow up lab appt

## 2022-03-02 DIAGNOSIS — B351 Tinea unguium: Secondary | ICD-10-CM | POA: Insufficient documentation

## 2022-03-02 DIAGNOSIS — E785 Hyperlipidemia, unspecified: Secondary | ICD-10-CM | POA: Insufficient documentation

## 2022-04-06 IMAGING — MG MM DIGITAL SCREENING BILAT W/ TOMO AND CAD
6 of 10 series · 6 of 30 positions shown · non-contrast
Comparison: Previous exam(s).

CLINICAL DATA: Screening.

EXAM:
DIGITAL SCREENING BILATERAL MAMMOGRAM WITH TOMOSYNTHESIS AND CAD
TECHNIQUE: Bilateral screening digital craniocaudal and mediolateral oblique
mammograms were obtained. Bilateral screening digital breast
tomosynthesis was performed. The images were evaluated with
computer-aided detection.

[R MLO synth-2D]
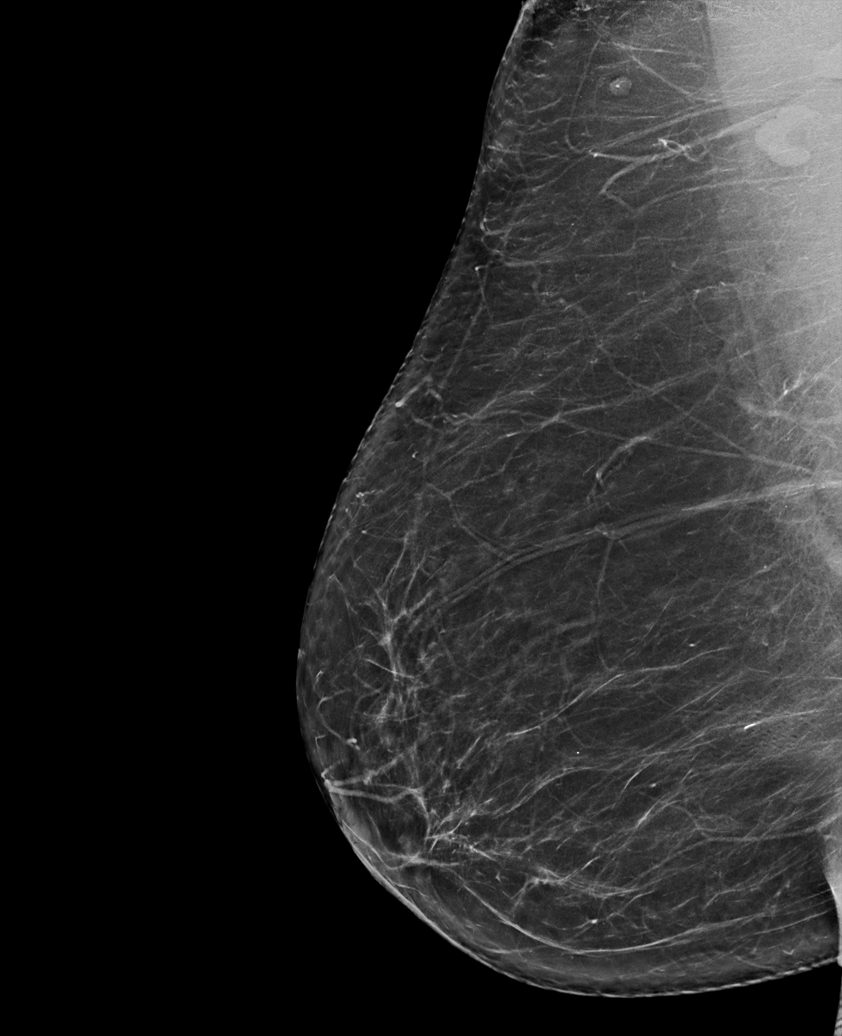

[L MLO synth-2D]
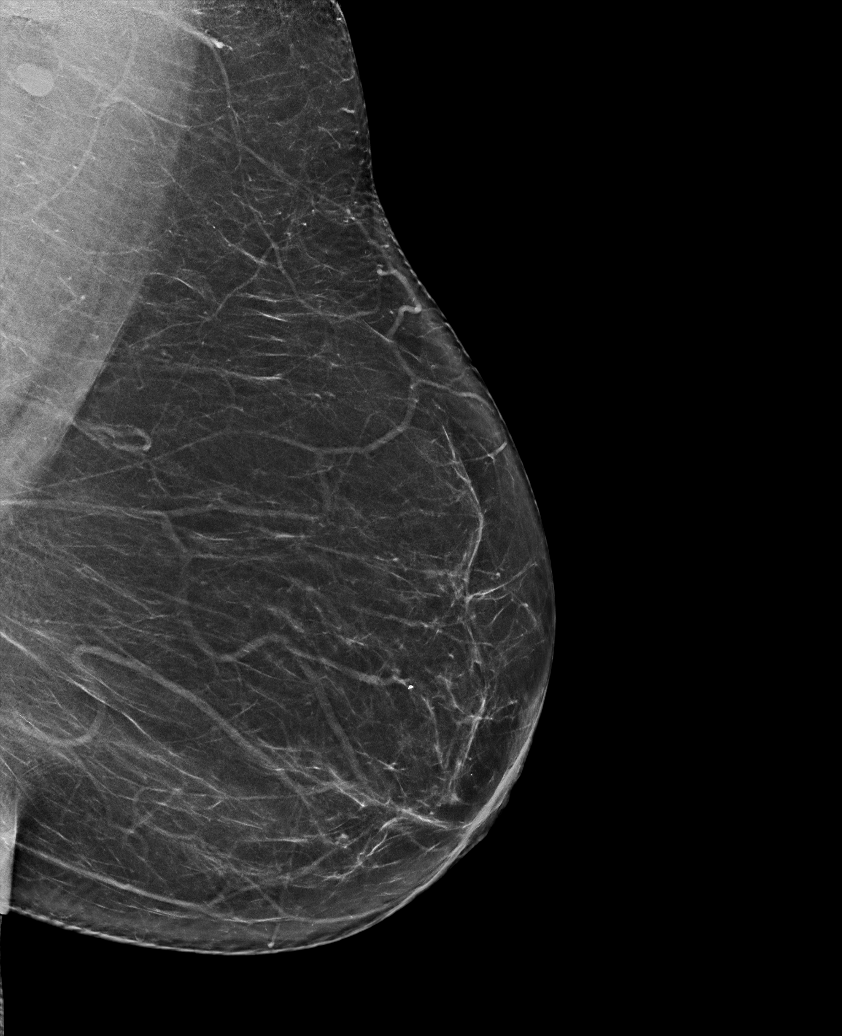

[L CC synth-2D]
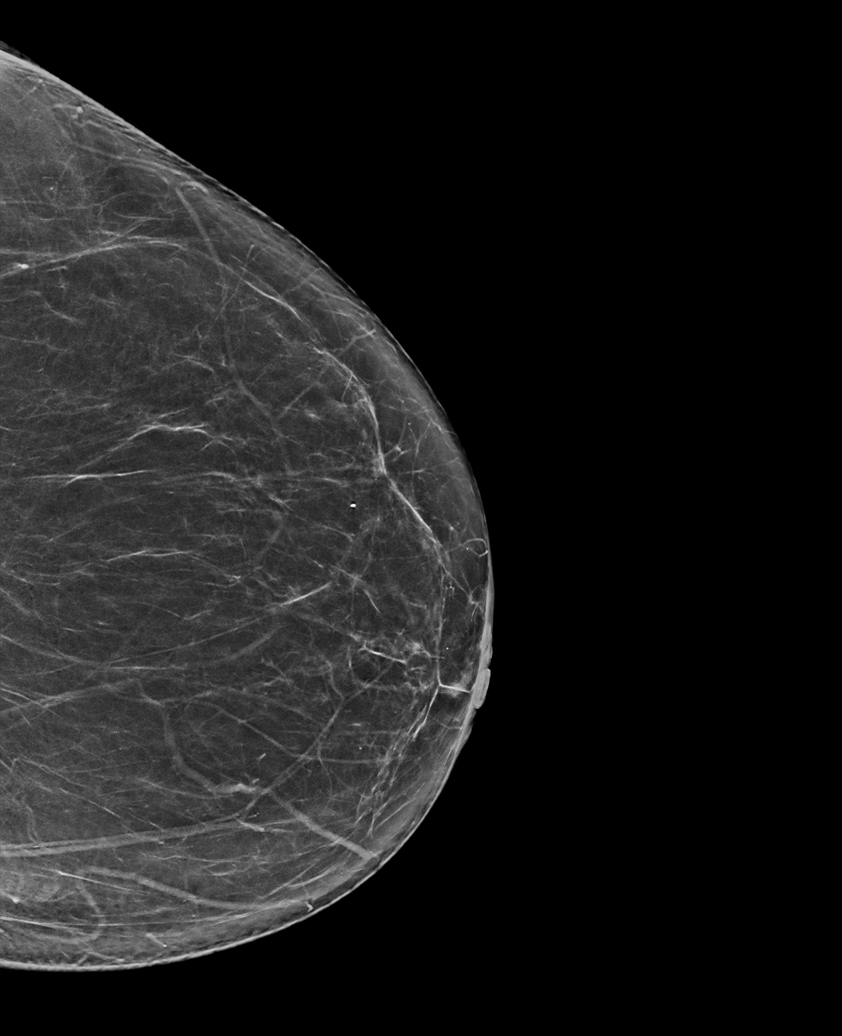

[R CV synth-2D]
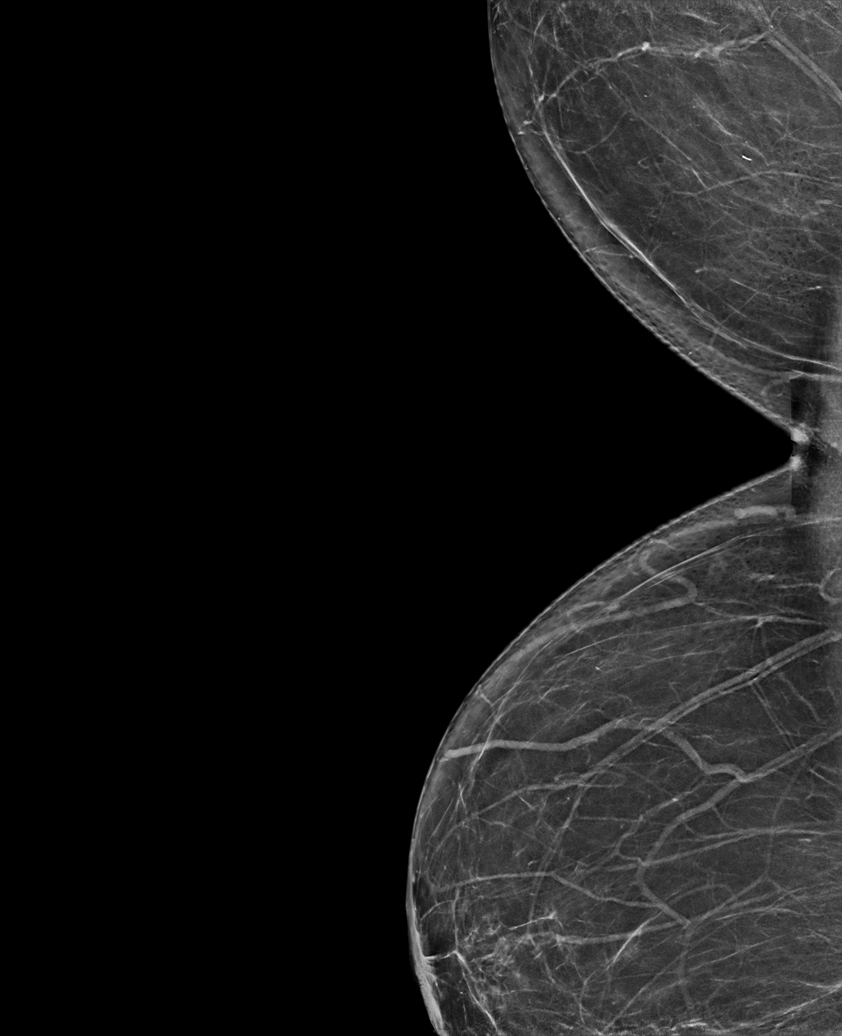

[R CC synth-2D]
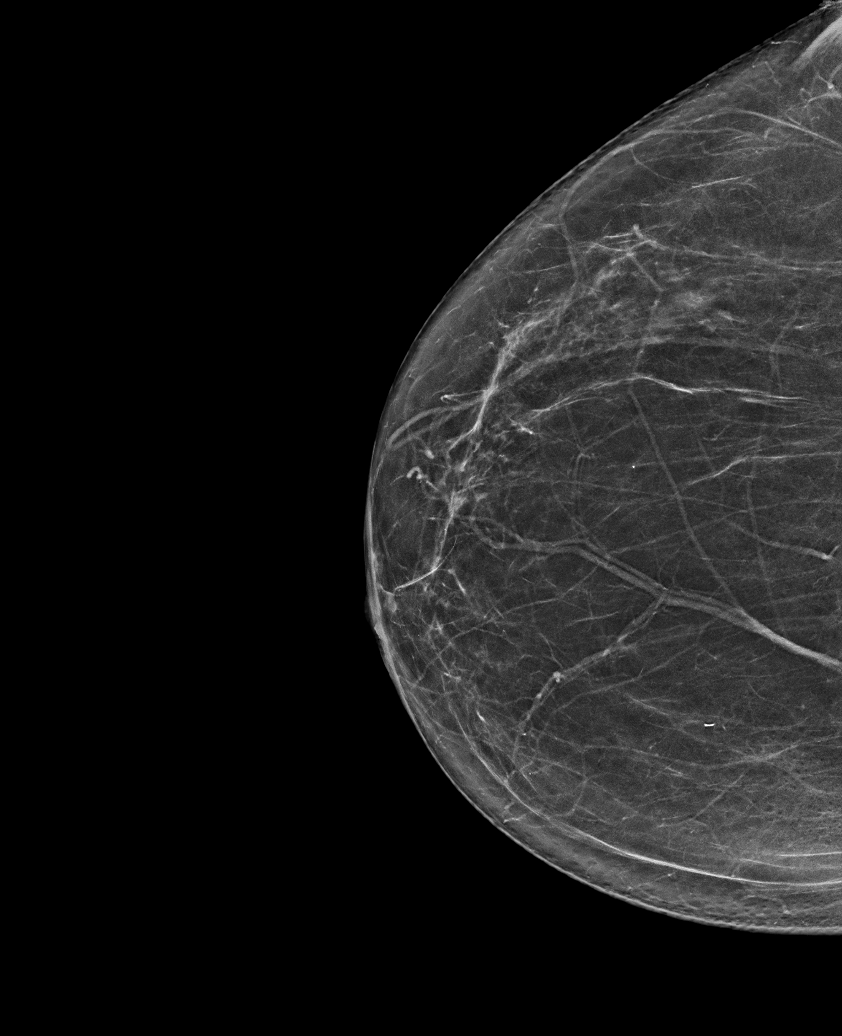

[L CC tomo · tomo slice 39/76.0]
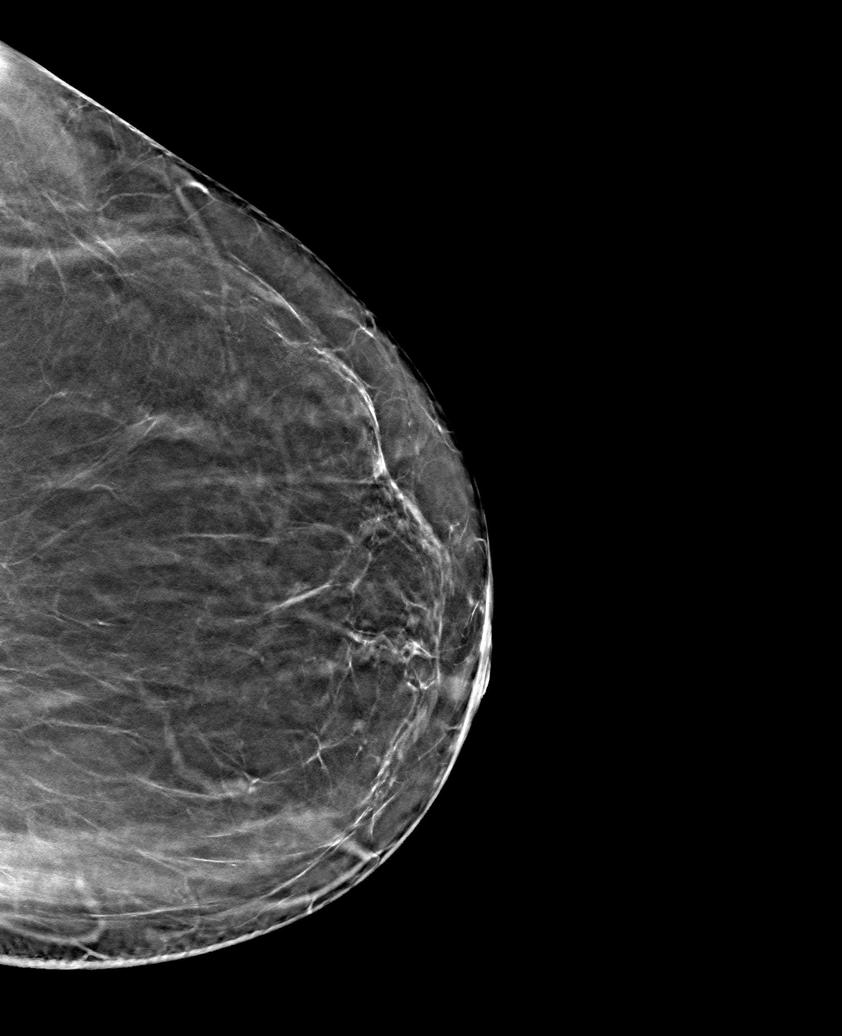

[6 of 30 positions shown; findings below may reference images not displayed]

ACR Breast Density Category b: There are scattered areas of
fibroglandular density.
FINDINGS: There are no findings suspicious for malignancy.
IMPRESSION: No mammographic evidence of malignancy. A result letter of this
screening mammogram will be mailed directly to the patient.

RECOMMENDATION:
Screening mammogram in one year. (Code:51-O-LD2)

BI-RADS CATEGORY  1: Negative.

## 2022-05-02 ENCOUNTER — Encounter: Payer: Self-pay | Admitting: Registered Nurse

## 2022-05-02 ENCOUNTER — Telehealth: Payer: Self-pay | Admitting: Registered Nurse

## 2022-05-02 DIAGNOSIS — R739 Hyperglycemia, unspecified: Secondary | ICD-10-CM

## 2022-05-02 NOTE — Telephone Encounter (Signed)
Patient seen by RN Oda Cogan.  POCT glucose testing 154.  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  VSS skin warm dry and pink A&Ox3 strength equal bilateral extremities.  Patient had eaten lunch today tolerating po intake without difficulty and feeling well/urinating without difficulty/dysuria/fever/chills/n/v/d/recent illness. Discussed avoid sweets at dinner tonight.  Eat protein and vegetables, avoid dehydration, drink water this afternoon to keep urine pale yellow clear and voiding every 2-4 hours while awake.  Discussed stress, illness, diet can increase blood sugar.  Discussed take pm dose metformin when she gets home today a few hours earlier than usual since unable to exercise at this time due to tremor/not feeling well.   Spoke full sentences without difficulty; no observed cough/throat clearing/rhinitis.  Patient planning to leave work early today and go home and rest.  Discussed I will follow up with her via telephone again tomorrow as clinic closed every Wed/Sat/Sun.  Patient and spouse verbalized understanding information/instructions, agreed with plan of care and had no further questions at this time.

## 2022-05-05 NOTE — Telephone Encounter (Signed)
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  Blood sugar improved today 77.  Yesterday had tremors for about another hours after arriving home but drank water and rested and finally resolved.  Has scheduled follow up with her PCM and labs scheduled 05/05/22.  Patient lives with son and spouse.  Denied concerns today feeling much better than yesterday.  Taking her medications as prescribed.  Tolerating po intake without difficulty and making healthy choices/not skipping meals and drinking water at work.  Denied fever/chills/n/v/d/headache/cough/congestion/runny nose or sore throat.  Consider covid testing if any of those symptoms develop as cases in community increasing the past 2 weeks.  Son reported he is planning to walk with mother for exercise on days off.  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  Patient denied further questions or needs at this time.  Had seen RN Stone earlier today and had BP check and was good.  Patient and son verbalized understanding information/instructions, agreed with plan of care and had no further questions at this time.

## 2022-05-06 NOTE — Telephone Encounter (Addendum)
Per epic review patient seen by [facility] 05/05/22 labs ordered and metformin Rx renewed.  VSS.  Lab results reviewed A1c improved slightly, BUN/CR ratio and Alb/Glob ratio elevated along with albumin and triglycerides.  Lipids greatly improved on atorvastatin.    Glucose 70 - 99 mg/dL 95   BUN 8 - 27 mg/dL 24   Creatinine 0.57 - 1.00 mg/dL 0.72   eGFR >59 mL/min/1.73 95   BUN/Creatinine Ratio 12 - 28 33 High    Sodium 134 - 144 mmol/L 137   Potassium 3.5 - 5.2 mmol/L 3.9   Chloride 96 - 106 mmol/L 99   CO2 20 - 29 mmol/L 22   CALCIUM 8.7 - 10.3 mg/dL 10.0   Total Protein 6.0 - 8.5 g/dL 7.3   Albumin, Serum 3.9 - 4.9 g/dL 5.2 High    Globulin, Total 1.5 - 4.5 g/dL 2.1   Albumin/Globulin Ratio 1.2 - 2.2 2.5 High    Total Bilirubin 0.0 - 1.2 mg/dL 0.5   Alkaline Phosphatase 44 - 121 IU/L 90   AST 0 - 40 IU/L 24   ALT (SGPT) 0 - 32 IU/L 17   Resulting Agency  [facility]  Narrative Performed by [facility] Performed at:  [facility]  [address], [place], [place]  [id number]  Lab Director: Rush Farmer MD, Phone:  [phone] Specimen Collected: 05/05/22 15:49   Performed by: Maryan Puls Last Resulted: 05/06/22 03:35  Received From: [facility]  Result Received: 05/06/22 14:06   View Encounter    Ref Range & Units 1 d ago Comments  Hemoglobin A1c 4.8 - 5.6 % 6.4 High            Prediabetes: 5.7 - 6.4           Diabetes: >6.4           Glycemic control for adults with diabetes: <7.0    Ref Range & Units 1 d ago  Cholesterol, Total 100 - 199 mg/dL 165   Triglycerides 0 - 149 mg/dL 160 High    HDL >39 mg/dL 58   VLDL Cholesterol Cal 5 - 40 mg/dL 27   LDL 0 - 99 mg/dL 80   Resulting Agency  [facility]  Narrative Performed by Maryan Puls Performed at:  [facility]  [address], [place], [place]  [id number]  Lab Director: Rush Farmer MD, Phone:  [phone] Specimen Collected: 05/05/22 15:49   Performed by: Maryan Puls Last Resulted: 05/06/22 03:35  Received From:  [facility]  Result Received: 05/06/22 14:06

## 2022-05-16 NOTE — Telephone Encounter (Signed)
Last filled metformin 1000mg  po BID #180 RF0, ibuprofen 800mg  po TID prn pain #30 RF0 and atorvastatin 20mg  po daily #90 04/22/20.  Patient stated saw PCM and new Rx received 05/05/22 for metformin 1000mg  po BID #60 RF2 next appt Oct 2023 with PCM.  She filled a 30 day supply at [facility] but would like to pick up bottle from [facility] since no copay to ensure she does not run out prior to her October appt. Patient stated still taking omeprazole 20mg  DR po daily prn heartburn but not daily still has pills remaining from dispense 12/20/21.  Lisinopril 5mg  po daily not carried at [facility] formulary but patient notified can cut 10mg  in half and take 1/2 tab daily dispensed lisinopril 10mg  sig t 1/2 po daily #90 RF0 to patient.  Patient reported feeling well denied concerns.  Patient stated fillls atorvastatin at [facility].  Reviewed PCM lab results with patient CMET, lipids (much improved), Hgba1c stable 6.4  Patient verbalized understanding information/instructions, agreed with plan of care and had no further questions at this time.

## 2022-05-26 ENCOUNTER — Other Ambulatory Visit: Payer: No Typology Code available for payment source

## 2022-05-26 DIAGNOSIS — E119 Type 2 diabetes mellitus without complications: Secondary | ICD-10-CM

## 2022-05-26 DIAGNOSIS — E559 Vitamin D deficiency, unspecified: Secondary | ICD-10-CM

## 2022-05-26 NOTE — Progress Notes (Signed)
Labs drawn from L antecubital without difficulty.  Patient tolerated well.  2x2 and coflex applied.  Patient instructed to leave in place for 15-20 minutes then replace with bandaid given to patient.  Patient verbalized understanding.

## 2022-05-27 ENCOUNTER — Telehealth: Payer: Self-pay | Admitting: Registered Nurse

## 2022-05-27 DIAGNOSIS — E119 Type 2 diabetes mellitus without complications: Secondary | ICD-10-CM

## 2022-05-27 DIAGNOSIS — Z8639 Personal history of other endocrine, nutritional and metabolic disease: Secondary | ICD-10-CM

## 2022-05-27 LAB — HGB A1C W/O EAG: Hgb A1c MFr Bld: 6.4 % — ABNORMAL HIGH (ref 4.8–5.6)

## 2022-05-27 LAB — VITAMIN D 25 HYDROXY (VIT D DEFICIENCY, FRACTURES): Vit D, 25-Hydroxy: 31.1 ng/mL (ref 30.0–100.0)

## 2022-05-27 MED ORDER — CHOLECALCIFEROL 1.25 MG (50000 UT) PO CAPS: 50000.0000 [IU] | ORAL_CAPSULE | ORAL | 0 refills | Status: AC

## 2022-05-27 NOTE — Telephone Encounter (Signed)
Spoke with patient via telephone.  She stated last vitamin D dose greater than 1 month ago 50,000 units po weekly.  Discussed with patient will restart 50,000 units weekly since low normal x 12 more doses  po #12 RF0 electronic Rx sent to her pharmacy of choice today and recheck level in 3 months with Hgba1c/[facility] labs 2024 to decide if switching to 2000 units po daily with meal OTC at that time.  Goal vitamin d level 30-60.  Hgba1c improved 0.2 points from last quarterly check.  Recheck Hgba1c and Vitamin D in 3 to 6 months with 2024 [facility] labs.  Patient to schedule lab with RN Joaquim Lai.  Patient verbalized understanding information/instructions, agreed with plan of care and had no further questions at this time.

## 2022-05-28 IMAGING — US US ABDOMEN COMPLETE
1 series · 14 of 25 positions shown · non-contrast
Comparison: CT November 24, 2021

CLINICAL DATA: Transaminitis

EXAM:
ABDOMEN ULTRASOUND COMPLETE

[Series 1: us abdomen complete · 14 of 141 slices shown]
[im 1/141]
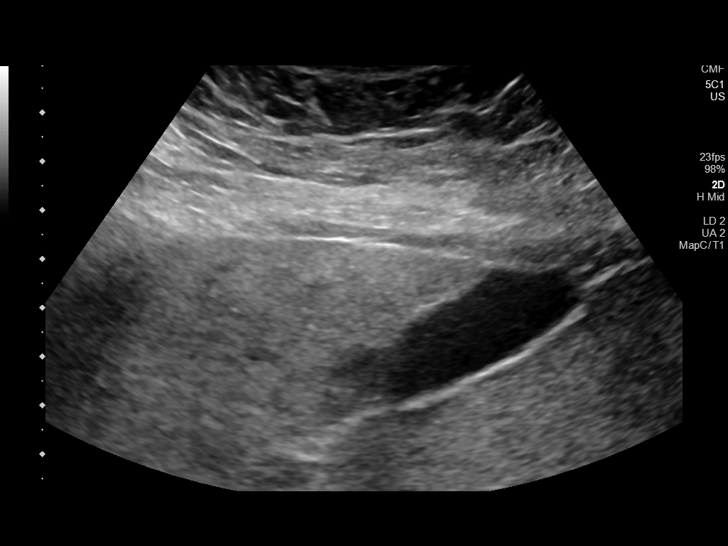
[im 12/141]
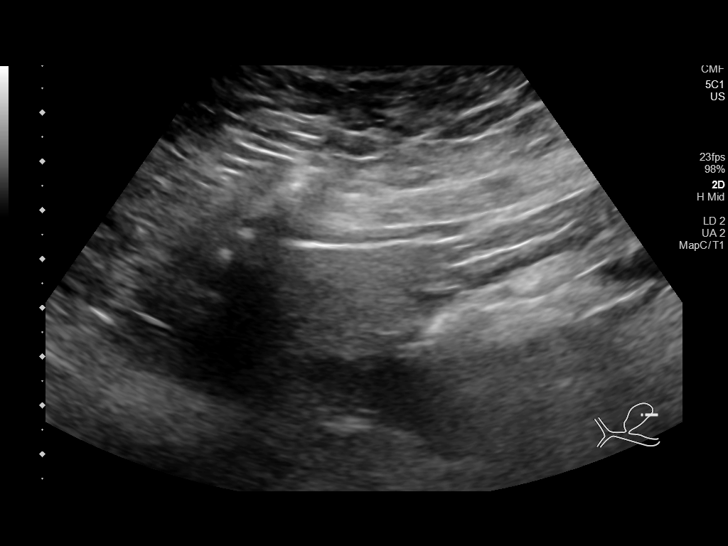
[im 24/141]
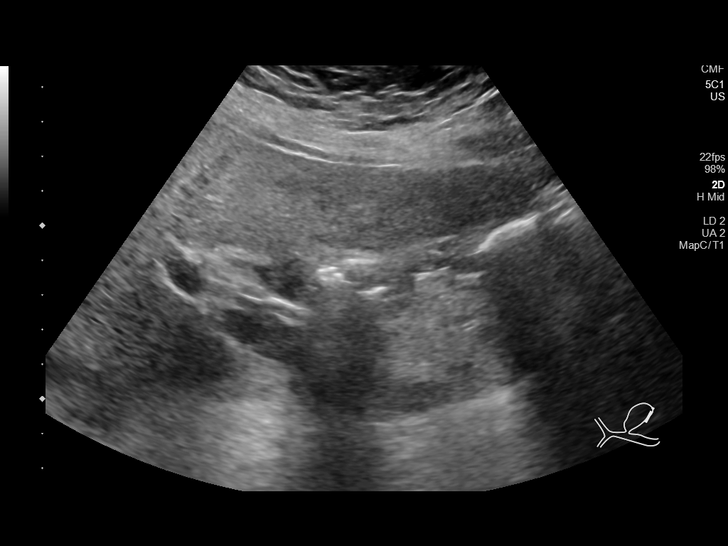
[im 36/141]
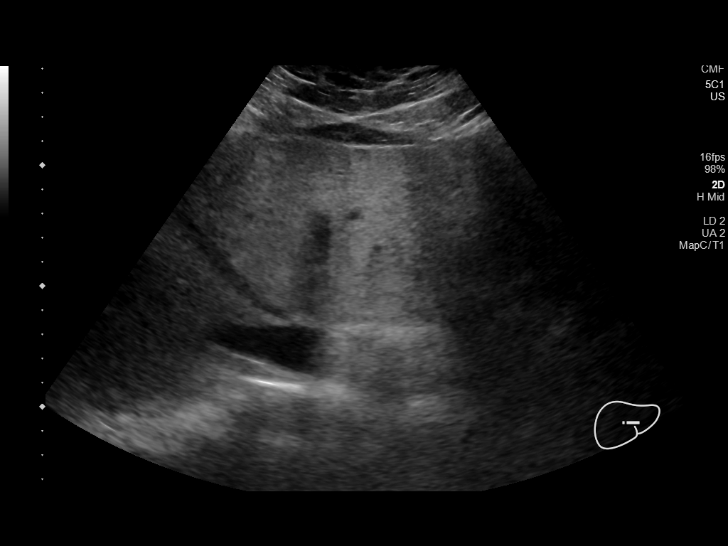
[im 47/141]
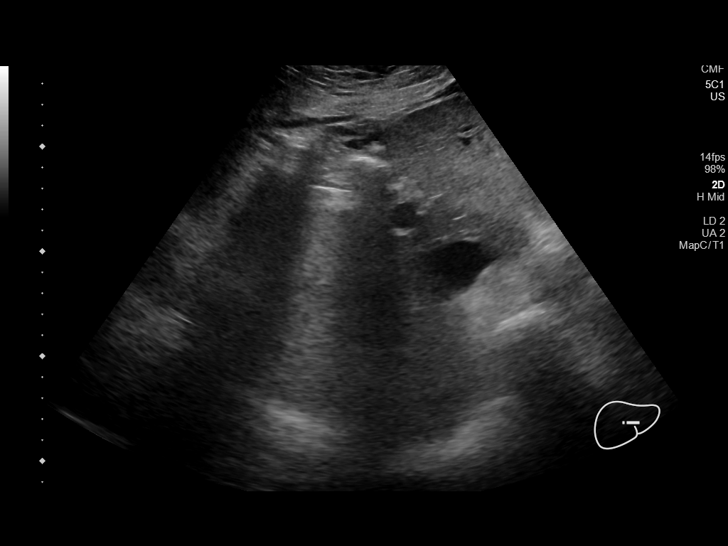
[im 53/141]
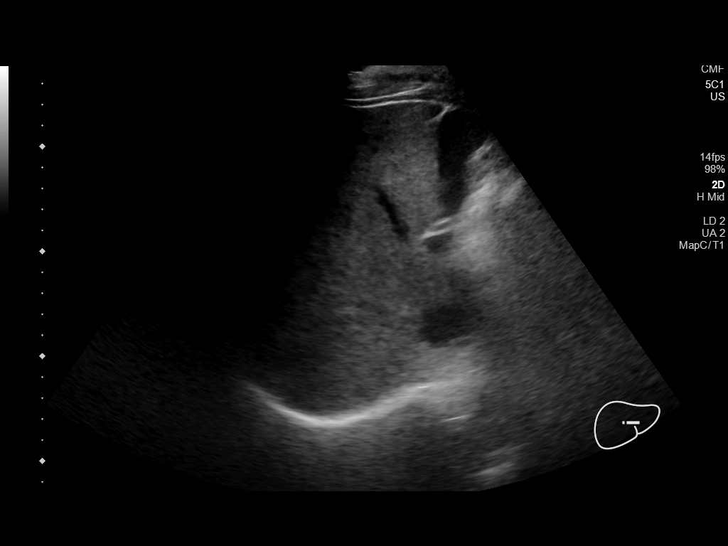
[im 65/141]
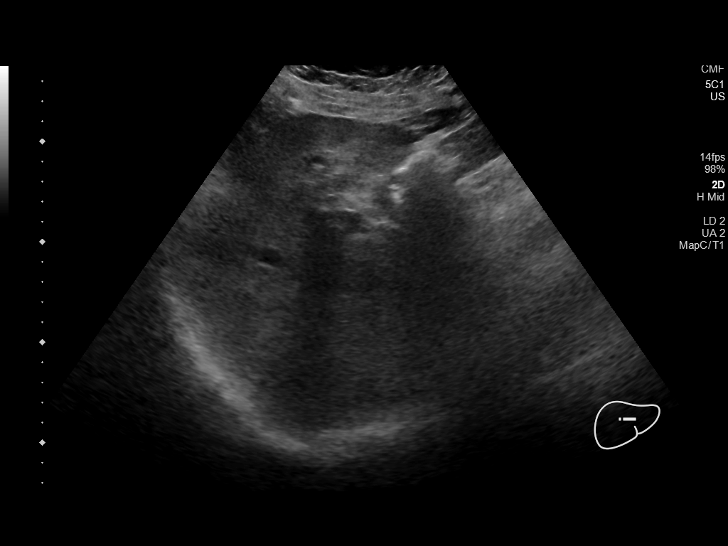
[im 76/141]
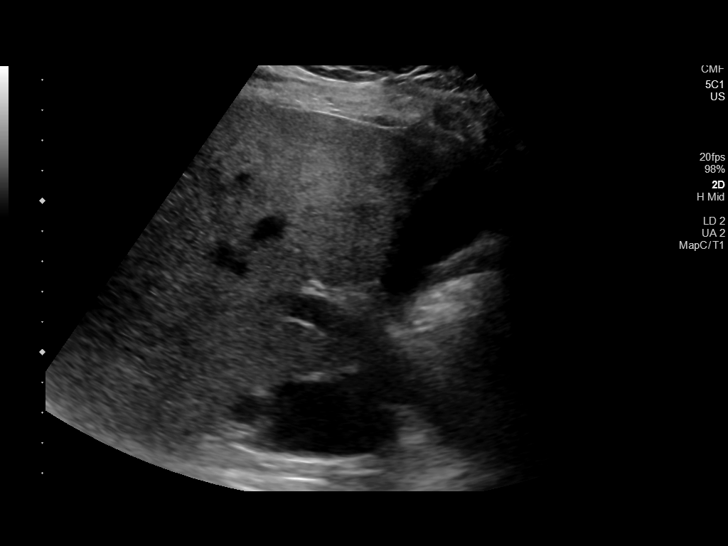
[im 88/141]
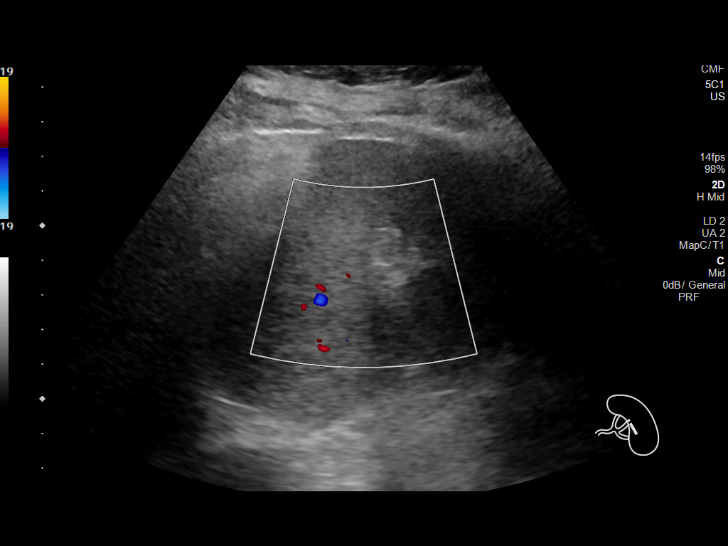
[im 94/141]
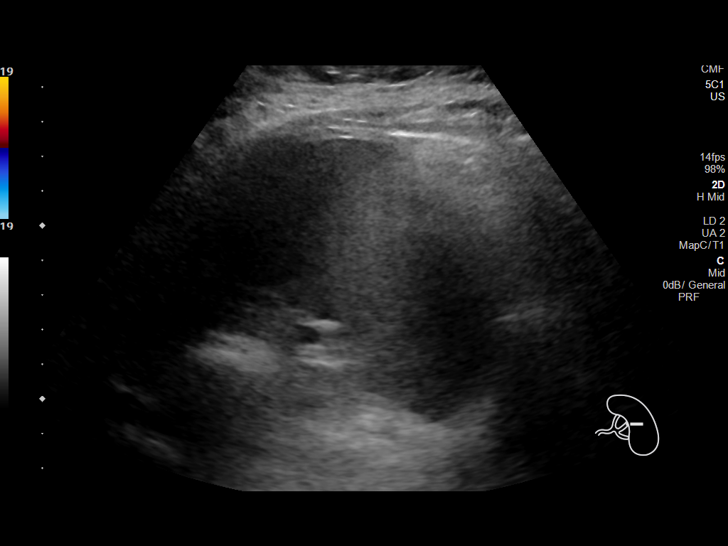
[im 106/141]
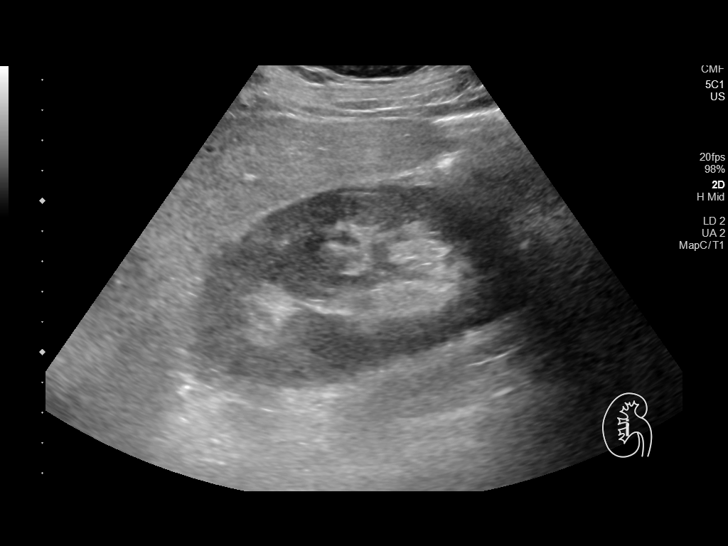
[im 117/141]
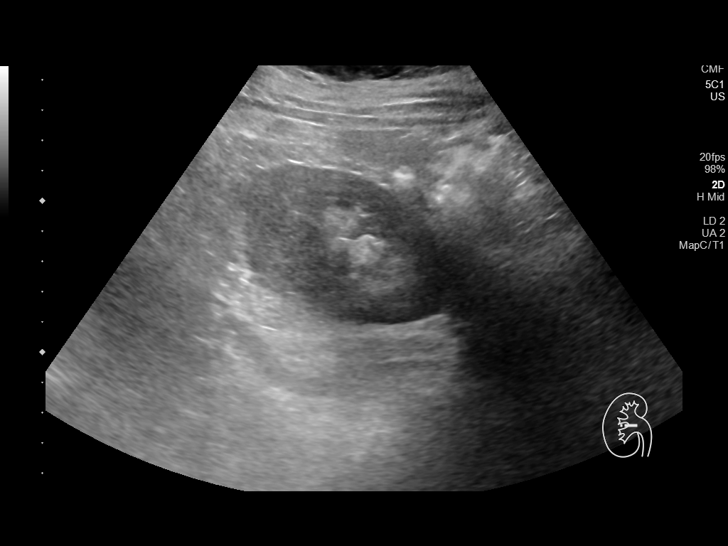
[im 129/141]
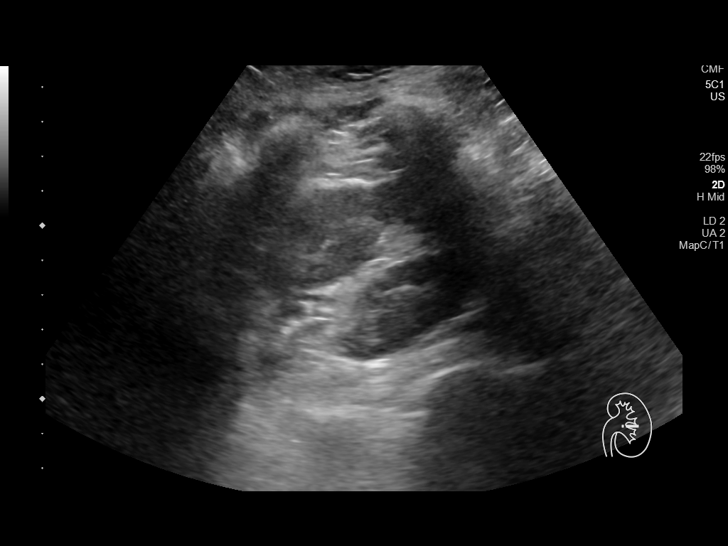
[im 141/141]
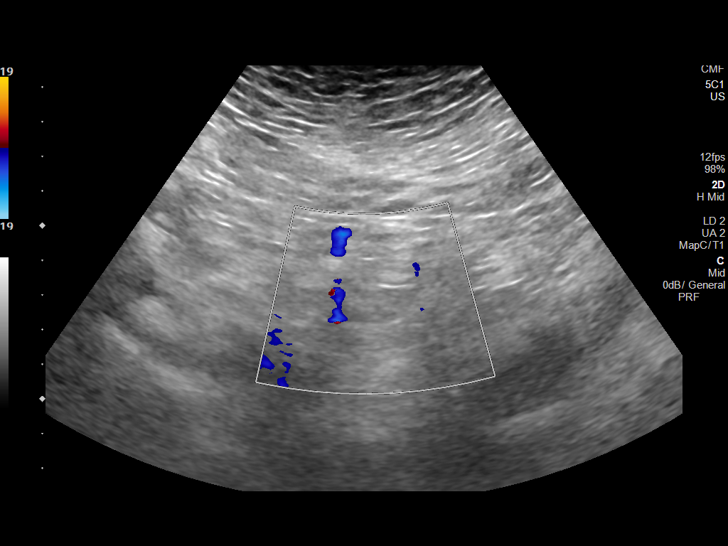

[14 of 25 positions shown; findings below may reference images not displayed]

FINDINGS: Gallbladder: No gallstones or wall thickening visualized. No
sonographic Murphy sign noted by sonographer.

Common bile duct: Diameter: 3 mm

Liver: No focal lesion identified. Increased hepatic parenchymal
echogenicity parenchymal echogenicity. Portal vein is patent on
color Doppler imaging with normal direction of blood flow towards
the liver.

IVC: No abnormality visualized.

Pancreas: Visualized portion unremarkable.

Spleen: Size and appearance within normal limits.

Right Kidney: Length: 11.4 cm. Echogenicity within normal limits. No
mass or hydronephrosis visualized.

Left Kidney: Length: 11.7 cm. Echogenicity within normal limits. No
mass or hydronephrosis visualized.

Abdominal aorta: No aneurysm visualized.

Other findings: None.
IMPRESSION: The echogenicity of the liver is increased. This is a nonspecific
finding but is most commonly seen with fatty infiltration of the
liver. There are no obvious focal liver lesions.

## 2022-05-29 NOTE — Telephone Encounter (Signed)
Noted; encounter closed

## 2022-08-21 ENCOUNTER — Telehealth: Payer: Self-pay | Admitting: Registered Nurse

## 2022-08-21 ENCOUNTER — Encounter: Payer: Self-pay | Admitting: Registered Nurse

## 2022-08-21 DIAGNOSIS — Z8639 Personal history of other endocrine, nutritional and metabolic disease: Secondary | ICD-10-CM

## 2022-08-21 DIAGNOSIS — E119 Type 2 diabetes mellitus without complications: Secondary | ICD-10-CM

## 2022-08-21 NOTE — Telephone Encounter (Signed)
RN Bess Kinds spoke with patient she requested refill vitamin D.  If patient has been taking cholecalciferol 50,000 units weekly please repeat nonfasting vitamin D.  Hgba1c also due for quarterly recheck,  last 05/26/22.  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  Had CBC [facility] platelets normal.   7.5    RBC 3.77 - 5.28 x10E6/uL 4.78  Hemoglobin 11.1 - 15.9 g/dL 07.6  Hematocrit 80.8 - 46.6 % 41.5  MCV 79 - 97 fL 87  MCH 26.6 - 33.0 pg 29.9  MCHC 31.5 - 35.7 g/dL 81.1  RDW 03.1 - 59.4 % 13.0  Platelet Count 150 - 450 x10E3/uL 194  Neutrophils Not Estab. % 44  Lymphs Relative Not Estab. % 46  Monocytes Not Estab. % 7  Eos Relative Not Estab. % 2  Basos Relative Not Estab. % 1  Neutrophils Absolute 1.4 - 7.0 x10E3/uL 3.3  Lymphocytes Absolute 0.7 - 3.1 x10E3/uL 3.4 High   Monocytes Absolute 0.1 - 0.9 x10E3/uL 0.5  Eosinophils Absolute 0.0 - 0.4 x10E3/uL 0.2  Basophils Absolute 0.0 - 0.2 x10E3/uL 0.1  Immature Granulocytes Not Estab. % 0  Immature Grans (Abs) 0.0 - 0.1 x10E3/uL 0.0  Resulting Agency  [facility]  Narrative Performed by [employer] Performed at:  [address] [facility]  [address] [place], [place]  [id number] Lab Director: Jolene Schimke MD, Phone:  [phone] Specimen Collected: 06/16/22 15:55   Performed by: Verdell Carmine Last Resulted: 06/17/22 01:35  Received From: [facility]  Result Received: 08/04/22 09:50

## 2022-08-22 ENCOUNTER — Other Ambulatory Visit: Payer: Self-pay | Admitting: Occupational Medicine

## 2022-08-22 DIAGNOSIS — E119 Type 2 diabetes mellitus without complications: Secondary | ICD-10-CM

## 2022-08-22 DIAGNOSIS — Z8639 Personal history of other endocrine, nutritional and metabolic disease: Secondary | ICD-10-CM

## 2022-08-22 NOTE — Progress Notes (Signed)
Lab drawn from Left AC tolerated well no issues noted.   

## 2022-08-23 ENCOUNTER — Encounter: Payer: Self-pay | Admitting: Registered Nurse

## 2022-08-23 LAB — HEMOGLOBIN A1C
Est. average glucose Bld gHb Est-mCnc: 137 mg/dL
Hgb A1c MFr Bld: 6.4 % — ABNORMAL HIGH (ref 4.8–5.6)

## 2022-08-23 LAB — VITAMIN D 25 HYDROXY (VIT D DEFICIENCY, FRACTURES): Vit D, 25-Hydroxy: 63.8 ng/mL (ref 30.0–100.0)

## 2022-08-23 MED ORDER — VITAMIN D3 50 MCG (2000 UT) PO CAPS: 2000.0000 [IU] | ORAL_CAPSULE | Freq: Every day | ORAL | Status: AC

## 2022-08-23 MED ORDER — CHOLECALCIFEROL 1.25 MG (50000 UT) PO TABS: 1.0000 | ORAL_TABLET | ORAL | 0 refills | Status: AC

## 2022-08-23 NOTE — Progress Notes (Signed)
See tcon dated 08/21/22

## 2022-08-23 NOTE — Telephone Encounter (Signed)
Refilled cholecalciferol 50,000 units po weekly x 12 weeks #12 RF0 for the winter then switch to cholecalciferol 2000 units po daily with meal over the counter.  Patient verbalized understanding information/instructions, agreed with plan of care and had no further questions at this time.

## 2022-08-29 ENCOUNTER — Other Ambulatory Visit: Payer: No Typology Code available for payment source | Admitting: Occupational Medicine
